# Patient Record
Sex: Male | Born: 1945 | Race: Black or African American | Hispanic: No | Smoking: Current every day smoker
Health system: Southern US, Community
[De-identification: ages and names within clinical notes are randomized; demographics above are authoritative.]

## PROBLEM LIST (undated history)

## (undated) DIAGNOSIS — E119 Type 2 diabetes mellitus without complications: Secondary | ICD-10-CM

## (undated) DIAGNOSIS — I629 Nontraumatic intracranial hemorrhage, unspecified: Secondary | ICD-10-CM

## (undated) DIAGNOSIS — I4891 Unspecified atrial fibrillation: Secondary | ICD-10-CM

## (undated) DIAGNOSIS — K219 Gastro-esophageal reflux disease without esophagitis: Secondary | ICD-10-CM

## (undated) DIAGNOSIS — F79 Unspecified intellectual disabilities: Secondary | ICD-10-CM

## (undated) DIAGNOSIS — N400 Enlarged prostate without lower urinary tract symptoms: Secondary | ICD-10-CM

## (undated) DIAGNOSIS — I10 Essential (primary) hypertension: Secondary | ICD-10-CM

## (undated) DIAGNOSIS — F209 Schizophrenia, unspecified: Secondary | ICD-10-CM

## (undated) DIAGNOSIS — S12500A Unspecified displaced fracture of sixth cervical vertebra, initial encounter for closed fracture: Secondary | ICD-10-CM

## (undated) DIAGNOSIS — I639 Cerebral infarction, unspecified: Secondary | ICD-10-CM

## (undated) DIAGNOSIS — I4819 Other persistent atrial fibrillation: Secondary | ICD-10-CM

## (undated) DIAGNOSIS — R609 Edema, unspecified: Secondary | ICD-10-CM

## (undated) HISTORY — PX: MULTIPLE TOOTH EXTRACTIONS: SHX2053

---

## 1997-05-13 ENCOUNTER — Encounter: Payer: Self-pay | Admitting: Internal Medicine

## 2001-01-07 ENCOUNTER — Emergency Department (HOSPITAL_COMMUNITY): Admission: EM | Admit: 2001-01-07 | Discharge: 2001-01-07 | Payer: Self-pay | Admitting: Emergency Medicine

## 2001-01-07 ENCOUNTER — Encounter: Payer: Self-pay | Admitting: Emergency Medicine

## 2001-09-22 ENCOUNTER — Encounter: Payer: Self-pay | Admitting: Pulmonary Disease

## 2001-09-22 ENCOUNTER — Ambulatory Visit (HOSPITAL_COMMUNITY): Admission: RE | Admit: 2001-09-22 | Discharge: 2001-09-22 | Payer: Self-pay | Admitting: Pulmonary Disease

## 2002-01-13 ENCOUNTER — Encounter: Payer: Self-pay | Admitting: Family Medicine

## 2002-01-13 ENCOUNTER — Emergency Department: Admission: EM | Admit: 2002-01-13 | Discharge: 2002-01-13 | Payer: Self-pay | Admitting: Emergency Medicine

## 2002-10-01 ENCOUNTER — Encounter: Payer: Self-pay | Admitting: Internal Medicine

## 2002-11-01 ENCOUNTER — Encounter (INDEPENDENT_AMBULATORY_CARE_PROVIDER_SITE_OTHER): Payer: Self-pay | Admitting: *Deleted

## 2002-11-01 ENCOUNTER — Encounter: Payer: Self-pay | Admitting: Internal Medicine

## 2002-11-01 ENCOUNTER — Encounter (INDEPENDENT_AMBULATORY_CARE_PROVIDER_SITE_OTHER): Payer: Self-pay | Admitting: Specialist

## 2002-11-01 ENCOUNTER — Ambulatory Visit (HOSPITAL_COMMUNITY): Admission: RE | Admit: 2002-11-01 | Discharge: 2002-11-01 | Payer: Self-pay | Admitting: Internal Medicine

## 2002-12-29 HISTORY — PX: CATARACT EXTRACTION: SUR2

## 2003-01-10 ENCOUNTER — Encounter: Payer: Self-pay | Admitting: Ophthalmology

## 2003-01-14 ENCOUNTER — Ambulatory Visit (HOSPITAL_COMMUNITY): Admission: RE | Admit: 2003-01-14 | Discharge: 2003-01-14 | Payer: Self-pay | Admitting: Ophthalmology

## 2003-04-22 ENCOUNTER — Ambulatory Visit (HOSPITAL_COMMUNITY): Admission: RE | Admit: 2003-04-22 | Discharge: 2003-04-22 | Payer: Self-pay | Admitting: Ophthalmology

## 2004-03-23 ENCOUNTER — Emergency Department (HOSPITAL_COMMUNITY): Admission: EM | Admit: 2004-03-23 | Discharge: 2004-03-23 | Payer: Self-pay | Admitting: Emergency Medicine

## 2004-05-21 ENCOUNTER — Ambulatory Visit: Payer: Self-pay | Admitting: *Deleted

## 2004-05-28 ENCOUNTER — Ambulatory Visit: Payer: Self-pay | Admitting: Family Medicine

## 2004-06-15 ENCOUNTER — Ambulatory Visit: Payer: Self-pay | Admitting: Family Medicine

## 2004-10-05 ENCOUNTER — Emergency Department (HOSPITAL_COMMUNITY): Admission: EM | Admit: 2004-10-05 | Discharge: 2004-10-06 | Payer: Self-pay | Admitting: Emergency Medicine

## 2004-10-09 ENCOUNTER — Ambulatory Visit: Payer: Self-pay | Admitting: Family Medicine

## 2004-12-10 ENCOUNTER — Emergency Department (HOSPITAL_COMMUNITY): Admission: EM | Admit: 2004-12-10 | Discharge: 2004-12-11 | Payer: Self-pay | Admitting: Emergency Medicine

## 2005-02-28 ENCOUNTER — Emergency Department (HOSPITAL_COMMUNITY): Admission: EM | Admit: 2005-02-28 | Discharge: 2005-02-28 | Payer: Self-pay | Admitting: Emergency Medicine

## 2005-09-03 ENCOUNTER — Ambulatory Visit: Payer: Self-pay | Admitting: Family Medicine

## 2006-04-14 ENCOUNTER — Ambulatory Visit: Payer: Self-pay | Admitting: Family Medicine

## 2006-05-09 ENCOUNTER — Emergency Department (HOSPITAL_COMMUNITY): Admission: EM | Admit: 2006-05-09 | Discharge: 2006-05-10 | Payer: Self-pay | Admitting: Emergency Medicine

## 2006-07-23 ENCOUNTER — Emergency Department (HOSPITAL_COMMUNITY): Admission: EM | Admit: 2006-07-23 | Discharge: 2006-07-23 | Payer: Self-pay | Admitting: Emergency Medicine

## 2006-07-27 ENCOUNTER — Ambulatory Visit: Payer: Self-pay | Admitting: Family Medicine

## 2006-10-07 ENCOUNTER — Emergency Department (HOSPITAL_COMMUNITY): Admission: EM | Admit: 2006-10-07 | Discharge: 2006-10-07 | Payer: Self-pay | Admitting: Emergency Medicine

## 2006-11-07 ENCOUNTER — Encounter (INDEPENDENT_AMBULATORY_CARE_PROVIDER_SITE_OTHER): Payer: Self-pay | Admitting: *Deleted

## 2006-11-07 ENCOUNTER — Emergency Department (HOSPITAL_COMMUNITY): Admission: EM | Admit: 2006-11-07 | Discharge: 2006-11-07 | Payer: Self-pay | Admitting: Emergency Medicine

## 2007-03-03 ENCOUNTER — Emergency Department (HOSPITAL_COMMUNITY): Admission: EM | Admit: 2007-03-03 | Discharge: 2007-03-03 | Payer: Self-pay | Admitting: Emergency Medicine

## 2007-03-27 ENCOUNTER — Ambulatory Visit: Payer: Self-pay | Admitting: Family Medicine

## 2007-03-27 LAB — CONVERTED CEMR LAB
ALT: 10 units/L (ref 0–53)
Albumin: 4.3 g/dL (ref 3.5–5.2)
Alkaline Phosphatase: 117 units/L (ref 39–117)
CO2: 25 meq/L (ref 19–32)
Chloride: 108 meq/L (ref 96–112)
Cholesterol: 120 mg/dL (ref 0–200)
Creatinine, Ser: 0.73 mg/dL (ref 0.40–1.50)
Potassium: 3.7 meq/L (ref 3.5–5.3)
Total Bilirubin: 0.4 mg/dL (ref 0.3–1.2)
Total CHOL/HDL Ratio: 2.4
Triglycerides: 73 mg/dL (ref <150)

## 2007-05-01 ENCOUNTER — Emergency Department (HOSPITAL_COMMUNITY): Admission: EM | Admit: 2007-05-01 | Discharge: 2007-05-01 | Payer: Self-pay | Admitting: *Deleted

## 2007-09-07 ENCOUNTER — Ambulatory Visit: Payer: Self-pay | Admitting: Family Medicine

## 2007-09-07 LAB — CONVERTED CEMR LAB
Cholesterol: 112 mg/dL (ref 0–200)
HDL: 44 mg/dL (ref >39)
Microalb, Ur: 1.45 mg/dL (ref 0.00–1.89)
PSA: 0.35 ng/mL (ref 0.10–4.00)

## 2008-01-01 ENCOUNTER — Ambulatory Visit: Payer: Self-pay | Admitting: Family Medicine

## 2008-03-26 ENCOUNTER — Ambulatory Visit: Payer: Self-pay | Admitting: Family Medicine

## 2008-03-26 LAB — CONVERTED CEMR LAB
ALT: 12 units/L (ref 0–53)
AST: 12 units/L (ref 0–37)
Alkaline Phosphatase: 56 units/L (ref 39–117)
Calcium: 9 mg/dL (ref 8.4–10.5)
Glucose, Bld: 81 mg/dL (ref 70–99)

## 2008-04-04 ENCOUNTER — Encounter (INDEPENDENT_AMBULATORY_CARE_PROVIDER_SITE_OTHER): Payer: Self-pay | Admitting: *Deleted

## 2008-04-04 ENCOUNTER — Emergency Department (HOSPITAL_COMMUNITY): Admission: EM | Admit: 2008-04-04 | Discharge: 2008-04-04 | Payer: Self-pay | Admitting: Emergency Medicine

## 2008-06-06 ENCOUNTER — Ambulatory Visit: Payer: Self-pay | Admitting: Family Medicine

## 2008-09-03 ENCOUNTER — Ambulatory Visit: Payer: Self-pay | Admitting: Family Medicine

## 2008-09-22 ENCOUNTER — Emergency Department (HOSPITAL_COMMUNITY): Admission: EM | Admit: 2008-09-22 | Discharge: 2008-09-22 | Payer: Self-pay | Admitting: Emergency Medicine

## 2008-09-25 ENCOUNTER — Ambulatory Visit: Payer: Self-pay | Admitting: Internal Medicine

## 2008-12-11 ENCOUNTER — Ambulatory Visit: Payer: Self-pay | Admitting: Family Medicine

## 2008-12-11 LAB — CONVERTED CEMR LAB
BUN: 7 mg/dL (ref 6–23)
CO2: 25 meq/L (ref 19–32)
Chloride: 106 meq/L (ref 96–112)
Cholesterol: 103 mg/dL (ref 0–200)
Glucose, Bld: 147 mg/dL — ABNORMAL HIGH (ref 70–99)
HDL: 40 mg/dL (ref >39)
LDL Cholesterol: 40 mg/dL (ref 0–99)
Sodium: 142 meq/L (ref 135–145)
Total CHOL/HDL Ratio: 2.6
Triglycerides: 117 mg/dL (ref <150)
VLDL: 23 mg/dL (ref 0–40)

## 2009-01-13 ENCOUNTER — Encounter (INDEPENDENT_AMBULATORY_CARE_PROVIDER_SITE_OTHER): Payer: Self-pay | Admitting: *Deleted

## 2009-01-13 ENCOUNTER — Emergency Department (HOSPITAL_COMMUNITY): Admission: EM | Admit: 2009-01-13 | Discharge: 2009-01-13 | Payer: Self-pay | Admitting: Emergency Medicine

## 2009-01-23 ENCOUNTER — Ambulatory Visit: Payer: Self-pay | Admitting: Family Medicine

## 2009-01-23 ENCOUNTER — Encounter: Payer: Self-pay | Admitting: Internal Medicine

## 2009-02-06 ENCOUNTER — Telehealth: Payer: Self-pay | Admitting: Internal Medicine

## 2009-02-10 ENCOUNTER — Telehealth: Payer: Self-pay | Admitting: Internal Medicine

## 2009-02-11 ENCOUNTER — Telehealth (INDEPENDENT_AMBULATORY_CARE_PROVIDER_SITE_OTHER): Payer: Self-pay | Admitting: *Deleted

## 2009-02-12 ENCOUNTER — Ambulatory Visit: Payer: Self-pay | Admitting: Internal Medicine

## 2009-02-12 DIAGNOSIS — R079 Chest pain, unspecified: Secondary | ICD-10-CM | POA: Insufficient documentation

## 2009-02-12 DIAGNOSIS — K219 Gastro-esophageal reflux disease without esophagitis: Secondary | ICD-10-CM

## 2009-02-12 DIAGNOSIS — K5900 Constipation, unspecified: Secondary | ICD-10-CM | POA: Insufficient documentation

## 2009-02-12 DIAGNOSIS — F79 Unspecified intellectual disabilities: Secondary | ICD-10-CM | POA: Insufficient documentation

## 2009-02-12 DIAGNOSIS — I10 Essential (primary) hypertension: Secondary | ICD-10-CM

## 2009-02-12 DIAGNOSIS — R112 Nausea with vomiting, unspecified: Secondary | ICD-10-CM

## 2009-02-13 ENCOUNTER — Telehealth: Payer: Self-pay | Admitting: Nurse Practitioner

## 2009-02-17 ENCOUNTER — Telehealth: Payer: Self-pay | Admitting: Internal Medicine

## 2009-02-17 ENCOUNTER — Telehealth: Payer: Self-pay | Admitting: Nurse Practitioner

## 2009-03-31 ENCOUNTER — Ambulatory Visit: Payer: Self-pay | Admitting: Internal Medicine

## 2009-03-31 ENCOUNTER — Telehealth: Payer: Self-pay | Admitting: Internal Medicine

## 2009-04-01 ENCOUNTER — Encounter: Payer: Self-pay | Admitting: Internal Medicine

## 2009-04-04 ENCOUNTER — Ambulatory Visit: Payer: Self-pay | Admitting: Family Medicine

## 2009-04-10 ENCOUNTER — Encounter (INDEPENDENT_AMBULATORY_CARE_PROVIDER_SITE_OTHER): Payer: Self-pay | Admitting: *Deleted

## 2009-04-10 ENCOUNTER — Ambulatory Visit (HOSPITAL_COMMUNITY): Admission: RE | Admit: 2009-04-10 | Discharge: 2009-04-10 | Payer: Self-pay | Admitting: Family Medicine

## 2009-04-16 ENCOUNTER — Telehealth: Payer: Self-pay | Admitting: Internal Medicine

## 2009-04-30 ENCOUNTER — Telehealth: Payer: Self-pay | Admitting: Internal Medicine

## 2009-05-01 ENCOUNTER — Telehealth (INDEPENDENT_AMBULATORY_CARE_PROVIDER_SITE_OTHER): Payer: Self-pay | Admitting: *Deleted

## 2009-05-16 ENCOUNTER — Ambulatory Visit: Payer: Self-pay | Admitting: Family Medicine

## 2009-05-19 ENCOUNTER — Ambulatory Visit: Payer: Self-pay | Admitting: Family Medicine

## 2009-06-09 ENCOUNTER — Ambulatory Visit: Payer: Self-pay | Admitting: Family Medicine

## 2009-08-21 ENCOUNTER — Ambulatory Visit: Payer: Self-pay | Admitting: Family Medicine

## 2009-08-21 LAB — CONVERTED CEMR LAB
Chloride: 102 meq/L (ref 96–112)
Vit D, 25-Hydroxy: 47 ng/mL (ref 30–89)

## 2009-12-10 ENCOUNTER — Ambulatory Visit (HOSPITAL_COMMUNITY): Admission: RE | Admit: 2009-12-10 | Discharge: 2009-12-10 | Payer: Self-pay | Admitting: Orthopedic Surgery

## 2010-01-15 ENCOUNTER — Ambulatory Visit (HOSPITAL_COMMUNITY): Admission: AD | Admit: 2010-01-15 | Discharge: 2010-01-15 | Payer: Self-pay | Admitting: Cardiology

## 2010-04-12 ENCOUNTER — Emergency Department (HOSPITAL_COMMUNITY): Admission: EM | Admit: 2010-04-12 | Discharge: 2010-04-13 | Payer: Self-pay | Admitting: Emergency Medicine

## 2010-11-13 LAB — URINE CULTURE

## 2010-11-13 LAB — URINALYSIS, ROUTINE W REFLEX MICROSCOPIC
Bilirubin Urine: NEGATIVE
Glucose, UA: 250 mg/dL — AB
Ketones, ur: NEGATIVE mg/dL
Leukocytes, UA: NEGATIVE
Nitrite: NEGATIVE
Protein, ur: NEGATIVE mg/dL
Specific Gravity, Urine: 1.007 (ref 1.005–1.030)
Urobilinogen, UA: 1 mg/dL (ref 0.0–1.0)

## 2010-11-13 LAB — DIFFERENTIAL
Eosinophils Absolute: 0.1 10*3/uL (ref 0.0–0.7)
Lymphocytes Relative: 33 % (ref 12–46)
Monocytes Relative: 10 % (ref 3–12)
Neutrophils Relative %: 55 % (ref 43–77)

## 2010-11-13 LAB — CBC
HCT: 44.8 % (ref 39.0–52.0)
Hemoglobin: 15 g/dL (ref 13.0–17.0)
MCH: 31.8 pg (ref 26.0–34.0)
MCV: 95.3 fL (ref 78.0–100.0)
Platelets: 198 10*3/uL (ref 150–400)
RBC: 4.7 MIL/uL (ref 4.22–5.81)

## 2010-11-13 LAB — BASIC METABOLIC PANEL
BUN: 8 mg/dL (ref 6–23)
CO2: 28 mEq/L (ref 19–32)
Calcium: 9.3 mg/dL (ref 8.4–10.5)
Chloride: 107 mEq/L (ref 96–112)
Potassium: 4 mEq/L (ref 3.5–5.1)

## 2010-11-13 LAB — POCT CARDIAC MARKERS
CKMB, poc: 1.5 ng/mL (ref 1.0–8.0)
Myoglobin, poc: 64.6 ng/mL (ref 12–200)
Troponin i, poc: <0.05 ng/mL (ref 0.00–0.09)

## 2010-11-18 LAB — CBC
HCT: 45.6 % (ref 39.0–52.0)
Platelets: 193 10*3/uL (ref 150–400)
WBC: 6.7 10*3/uL (ref 4.0–10.5)

## 2010-11-18 LAB — BASIC METABOLIC PANEL
BUN: 5 mg/dL — ABNORMAL LOW (ref 6–23)
Calcium: 9.6 mg/dL (ref 8.4–10.5)
GFR calc non Af Amer: >60 mL/min (ref >60)
Potassium: 4.2 mEq/L (ref 3.5–5.1)
Sodium: 139 mEq/L (ref 135–145)

## 2010-12-08 LAB — POCT I-STAT, CHEM 8
Creatinine, Ser: 0.8 mg/dL (ref 0.4–1.5)
HCT: 48 % (ref 39.0–52.0)
Hemoglobin: 16.3 g/dL (ref 13.0–17.0)
Potassium: 3.5 mEq/L (ref 3.5–5.1)
Sodium: 143 mEq/L (ref 135–145)
TCO2: 30 mmol/L (ref 0–100)

## 2010-12-08 LAB — URINE MICROSCOPIC-ADD ON

## 2010-12-08 LAB — URINALYSIS, ROUTINE W REFLEX MICROSCOPIC
Bilirubin Urine: NEGATIVE
Leukocytes, UA: NEGATIVE
Nitrite: NEGATIVE
Specific Gravity, Urine: 1.007 (ref 1.005–1.030)
Urobilinogen, UA: 1 mg/dL (ref 0.0–1.0)
pH: 7 (ref 5.0–8.0)

## 2010-12-08 LAB — POCT CARDIAC MARKERS
CKMB, poc: <1 ng/mL — ABNORMAL LOW (ref 1.0–8.0)
CKMB, poc: <1 ng/mL — ABNORMAL LOW (ref 1.0–8.0)
Myoglobin, poc: 34.9 ng/mL (ref 12–200)
Myoglobin, poc: 47.1 ng/mL (ref 12–200)

## 2010-12-14 LAB — GLUCOSE, CAPILLARY: Glucose-Capillary: 150 mg/dL — ABNORMAL HIGH (ref 70–99)

## 2011-01-15 NOTE — Op Note (Signed)
NAMEKAMAAL, CAST NO.:  [id number]   MEDICAL RECORD NO.:  [id number]                   PATIENT TYPE:  OIB   LOCATION:  2872                                 FACILITY:  [facility]   PHYSICIAN:  Salley Scarlet., M.D.         DATE OF BIRTH:  1946/06/07   DATE OF PROCEDURE:  01/14/2003  DATE OF DISCHARGE:  01/14/2003                                 OPERATIVE REPORT   PREOPERATIVE DIAGNOSIS:  Immature cataract, left eye.   POSTOPERATIVE DIAGNOSIS:  Immature cataract, left eye.   PROCEDURE:  Kellman phacoemulsification cataract of left eye.   ANESTHESIA:  Local using Xylocaine 2%, Marcaine 0.75%, and Wydase.   INDICATIONS FOR PROCEDURE:  This is a 65 year old diabetic gentleman who  complains of blurring of vision. He was evaluated and found to have a visual  acuity best corrected at 20/50 on the right and 20/60 on the left.  There  were bilateral posterior capsular cataracts worse on the left than the  right. Cataract extraction of the left eye was recommended.  He is admitted  at this time for that purpose.   DESCRIPTION OF PROCEDURE:  Under influence of IV sedation and Van Lint  akinesia, retrobulbar anesthesia was given.  The patient was prepped and  draped in the usual sterile fashion.  The lid speculum was inserted under  the upper and lower lid of the left eye and a 4-0 silk traction suture was  passed through the belly of the superior rectus muscle for traction. A  fornix-based conjunctival flap was turned and hemostasis was achieved with  the use of cautery.  An incision was made in the superior limbus. This  incision was dissected down to clear cornea using crescent blade.  A  sideport incision was made at the 1:30 o'clock position.  Occucoat was  injected into the eye through the sideport incision.  The anterior chamber  was then entered through the corneoscleral tunnel incision at 11:30 o'clock  position using a 2.7 mm Keratome. An  anterior capsulotomy was done using  bent 25 gauge needle. The nucleus was hydrodissected using Xylocaine.  The  KPE handpiece was passed into the eye and the nucleus was emulsified without  difficulty.  The residual cortical material was aspirated.  The posterior  capsule was polished using olive-tip polisher. The wound was widened  slightly to accommodate a foldable silicon lens. This lens was seated into  the eye behind the iris without difficulty. The anterior chamber was  reformed and the pupil was constricted using Miochol.  The residual Occucoat  was aspirated from the eye. The anterior chamber was then reformed using  Miochol. The ellipse of the wound was tested to make sure that there was no  leak.  Ascertaining that there was no leak, the conjunctiva was closed over  the wound using thermocautery. 1 mL of Celestone and 0.5 mL of gentamicin  were injected subconjunctivally.  Maxitrol  ophthalmic ointment and Pilopine  ointment were applied along with a patch and Fox shield. The patient  tolerated the procedure well and was discharged to the postanesthesia care  unit in satisfactory condition. He is instructed to rest today, to take  Vicodin every four hours as needed for pain, and to see me in the office  tomorrow for further evaluation.   DISCHARGE DIAGNOSIS:  Immature cataract, left eye.                                               Salley Scarlet., M.D.    TB/MEDQ  D:  01/14/2003  T:  01/15/2003  Job:  [id number]

## 2011-01-29 HISTORY — PX: CATARACT EXTRACTION W/ INTRAOCULAR LENS IMPLANT: SHX1309

## 2011-02-15 ENCOUNTER — Encounter (HOSPITAL_COMMUNITY)
Admission: RE | Admit: 2011-02-15 | Discharge: 2011-02-15 | Disposition: A | Discharge status: Home / Self Care | Payer: Medicare Other | Source: Ambulatory Visit | Attending: Ophthalmology | Admitting: Ophthalmology

## 2011-02-15 LAB — CBC
MCH: 31.1 pg (ref 26.0–34.0)
MCHC: 35 g/dL (ref 30.0–36.0)
MCV: 89 fL (ref 78.0–100.0)
Platelets: 221 10*3/uL (ref 150–400)
RBC: 4.98 MIL/uL (ref 4.22–5.81)

## 2011-02-15 LAB — BASIC METABOLIC PANEL
CO2: 29 mEq/L (ref 19–32)
Calcium: 9.4 mg/dL (ref 8.4–10.5)
Creatinine, Ser: 0.67 mg/dL (ref 0.50–1.35)
GFR calc non Af Amer: >60 mL/min (ref >60)
Glucose, Bld: 336 mg/dL — ABNORMAL HIGH (ref 70–99)
Sodium: 138 mEq/L (ref 135–145)

## 2011-02-17 ENCOUNTER — Ambulatory Visit (HOSPITAL_COMMUNITY)
Admission: RE | Admit: 2011-02-17 | Discharge: 2011-02-17 | Disposition: A | Discharge status: Home / Self Care | Payer: Medicare Other | Source: Ambulatory Visit | Attending: Ophthalmology | Admitting: Ophthalmology

## 2011-02-17 DIAGNOSIS — Z01812 Encounter for preprocedural laboratory examination: Secondary | ICD-10-CM | POA: Insufficient documentation

## 2011-02-17 DIAGNOSIS — F209 Schizophrenia, unspecified: Secondary | ICD-10-CM | POA: Insufficient documentation

## 2011-02-17 DIAGNOSIS — I10 Essential (primary) hypertension: Secondary | ICD-10-CM | POA: Insufficient documentation

## 2011-02-17 DIAGNOSIS — H269 Unspecified cataract: Secondary | ICD-10-CM | POA: Insufficient documentation

## 2011-02-17 LAB — GLUCOSE, CAPILLARY
Glucose-Capillary: 249 mg/dL — ABNORMAL HIGH (ref 70–99)
Glucose-Capillary: 250 mg/dL — ABNORMAL HIGH (ref 70–99)
Glucose-Capillary: 194 mg/dL — ABNORMAL HIGH (ref 70–99)

## 2011-03-31 NOTE — Op Note (Signed)
  NAMEASBERRY, LASCOLA              ACCOUNT NO.:  [id number]  MEDICAL RECORD NO.:  [id number]  LOCATION:  [facility]                         FACILITY:  [facility]  PHYSICIAN:  Shade Flood, MD       DATE OF BIRTH:  1945/12/02  DATE OF PROCEDURE:  02/17/2011 DATE OF DISCHARGE:                              OPERATIVE REPORT   PREOPERATIVE DIAGNOSIS:  Cataract, right eye.  POSTOPERATIVE DIAGNOSIS:  Cataract, right eye.  PROCEDURE PERFORMED:  Phacoemulsification with posterior chamber intraocular lens, right eye.  SURGEON:  Shade Flood, MD  ESTIMATED BLOOD LOSS:  Less than 1 mL.  COMPLICATIONS:  There were no complications.  SPECIMENS:  No specimens for Pathology.  The patient was prepared and draped in the usual fashion for ocular surgery on the right eye and a solid lid speculum was placed.  Calipers were used to show 3.5 mm at the superior surgical limbus centered at the 11 o'clock meridian.  A Grieshaber 0/1 blade was used to make a peripheral corneal scleral groove and then the keratome was used to make a self-sealing corneal incision centered at that meridian.  Provisc was instilled and a bent 25-gauge needle was used to make a capsulorrhexis. The lens was hydrodissected and the Chang chopper was used to rotate the lens within the capsular bag to ensure adequate mobility.  A separate stab incision was made at the 2:30 meridian.  The phaco handpiece and Chang chopper were inserted and a combined phaco chop technique was employed fracturing the lens with subsequent removal of the fragments with the phaco handpiece.  The IA cannula was then used to remove the cortex from the capsule bag.  Provisc was then instilled into the anterior chamber and the capsule bag and the wound was enlarged to its full 3.5 mm.  The Monarch injector was used to place a folded AcrySof MA50-BM intraocular lens into the bag and the Sinskey lens hook was used to dial in the trailing haptic.  The IA cannula was  then used to remove viscoelastic from the anterior chamber and one interrupted 10-0 nylon suture was used to reapproximate the wound.  The patient was given 100 mg of Ancef and 4 mg of Decadron subconjunctivally and then the lid speculum was removed.  The eye was patched with polymyxin/bacitracin ophthalmic ointment and a plastic shield was placed.  He was transferred alert with stable vital signs from the operating room to the postoperative recovery area.          ______________________________ Shade Flood, MD     GG/MEDQ  D:  02/17/2011  T:  02/18/2011  Job:  [id number]  Electronically Signed by Shade Flood MD on 03/31/2011 11:18:18 AM

## 2011-05-28 LAB — CBC
MCV: 96.7
Platelets: 181
RBC: 5.44
WBC: 8

## 2011-05-28 LAB — DIFFERENTIAL
Basophils Absolute: 0
Basophils Relative: 1
Eosinophils Absolute: 0.1
Eosinophils Relative: 1
Monocytes Absolute: 0.6

## 2011-05-28 LAB — URINALYSIS, ROUTINE W REFLEX MICROSCOPIC
Bilirubin Urine: NEGATIVE
Ketones, ur: NEGATIVE
Protein, ur: NEGATIVE
Specific Gravity, Urine: 1.021
Urobilinogen, UA: 1

## 2011-05-28 LAB — COMPREHENSIVE METABOLIC PANEL
ALT: 20
AST: 18
Albumin: 4.5
Alkaline Phosphatase: 78
CO2: 29
Chloride: 103
GFR calc Af Amer: >60
GFR calc non Af Amer: >60
Potassium: 3.5
Total Bilirubin: 0.7

## 2011-05-28 LAB — LIPASE, BLOOD: Lipase: 28

## 2011-05-28 LAB — URINE MICROSCOPIC-ADD ON

## 2011-06-11 LAB — DIFFERENTIAL
Basophils Absolute: 0
Eosinophils Absolute: 0
Eosinophils Relative: 0
Neutrophils Relative %: 87 — ABNORMAL HIGH

## 2011-06-11 LAB — CBC
HCT: 42.6
MCV: 93.5
Platelets: 208
RDW: 15 — ABNORMAL HIGH

## 2011-06-11 LAB — BASIC METABOLIC PANEL
Calcium: 8.7
Chloride: 105
Creatinine, Ser: 0.84
GFR calc Af Amer: >60
Sodium: 141

## 2012-01-13 ENCOUNTER — Emergency Department (HOSPITAL_COMMUNITY)
Admission: EM | Admit: 2012-01-13 | Discharge: 2012-01-13 | Disposition: A | Discharge status: Skilled Nursing Facility | Payer: Medicare Other | Attending: Emergency Medicine | Admitting: Emergency Medicine

## 2012-01-13 ENCOUNTER — Encounter (HOSPITAL_COMMUNITY): Payer: Self-pay | Admitting: Emergency Medicine

## 2012-01-13 DIAGNOSIS — Z79899 Other long term (current) drug therapy: Secondary | ICD-10-CM | POA: Insufficient documentation

## 2012-01-13 DIAGNOSIS — M7989 Other specified soft tissue disorders: Secondary | ICD-10-CM

## 2012-01-13 DIAGNOSIS — I10 Essential (primary) hypertension: Secondary | ICD-10-CM | POA: Insufficient documentation

## 2012-01-13 DIAGNOSIS — M79609 Pain in unspecified limb: Secondary | ICD-10-CM | POA: Insufficient documentation

## 2012-01-13 DIAGNOSIS — E119 Type 2 diabetes mellitus without complications: Secondary | ICD-10-CM | POA: Insufficient documentation

## 2012-01-13 DIAGNOSIS — Z8659 Personal history of other mental and behavioral disorders: Secondary | ICD-10-CM | POA: Insufficient documentation

## 2012-01-13 DIAGNOSIS — K219 Gastro-esophageal reflux disease without esophagitis: Secondary | ICD-10-CM | POA: Insufficient documentation

## 2012-01-13 DIAGNOSIS — I2510 Atherosclerotic heart disease of native coronary artery without angina pectoris: Secondary | ICD-10-CM | POA: Insufficient documentation

## 2012-01-13 DIAGNOSIS — M79622 Pain in left upper arm: Secondary | ICD-10-CM

## 2012-01-13 HISTORY — DX: Schizophrenia, unspecified: F20.9

## 2012-01-13 HISTORY — DX: Gastro-esophageal reflux disease without esophagitis: K21.9

## 2012-01-13 HISTORY — DX: Edema, unspecified: R60.9

## 2012-01-13 HISTORY — DX: Essential (primary) hypertension: I10

## 2012-01-13 LAB — PROTIME-INR
INR: 1.01 (ref 0.00–1.49)
Prothrombin Time: 13.5 seconds (ref 11.6–15.2)

## 2012-01-13 LAB — BASIC METABOLIC PANEL
CO2: 28 mEq/L (ref 19–32)
Calcium: 9.2 mg/dL (ref 8.4–10.5)
Chloride: 97 mEq/L (ref 96–112)
Potassium: 3.7 mEq/L (ref 3.5–5.1)
Sodium: 136 mEq/L (ref 135–145)

## 2012-01-13 LAB — DIFFERENTIAL
Basophils Absolute: 0 10*3/uL (ref 0.0–0.1)
Lymphocytes Relative: 43 % (ref 12–46)
Neutro Abs: 4.1 10*3/uL (ref 1.7–7.7)
Neutrophils Relative %: 49 % (ref 43–77)

## 2012-01-13 LAB — CBC
Platelets: 210 10*3/uL (ref 150–400)
RBC: 5.12 MIL/uL (ref 4.22–5.81)
RDW: 16.8 % — ABNORMAL HIGH (ref 11.5–15.5)
WBC: 8.4 10*3/uL (ref 4.0–10.5)

## 2012-01-13 LAB — APTT: aPTT: 29 seconds (ref 24–37)

## 2012-01-13 NOTE — ED Notes (Signed)
ION:[id number]<MW> Expected date:<BR> Expected time: 4:41 PM<BR> Means of arrival:<BR> Comments:<BR> M30 - 66yoM Arm edema

## 2012-01-13 NOTE — Progress Notes (Signed)
VASCULAR LAB PRELIMINARY  PRELIMINARY  PRELIMINARY  PRELIMINARY  Left upper extremity venous duplex has been  completed.    Preliminary report: Left upper extremity is negative for deep and superficial vein thrombosis.     Vanna Scotland, 01/13/2012, 10:36 PM

## 2012-01-13 NOTE — ED Notes (Signed)
Notified by vascular tech will be a delay due to multiple test to be done at [facility] before they can come to [facility].

## 2012-01-13 NOTE — ED Provider Notes (Signed)
History     CSN: [id number]  Arrival date & time 01/13/12  1648   First MD Initiated Contact with Patient 01/13/12 1716      Chief Complaint  Patient presents with  . Arm Swelling    (Consider location/radiation/quality/duration/timing/severity/associated sxs/prior treatment) HPI Pt presents with swelling to LUE x 1 day with pain at bicep. No SOB or CP, Hx of blood clots, fever. No known trauma.  Past Medical History  Diagnosis Date  . Diabetes mellitus   . Hypertension   . GERD (gastroesophageal reflux disease)   . Vitamin d deficiency   . Constipation   . Schizophrenia   . Seizure   . Edema   . Glaucoma   . CAD (coronary artery disease)     No past surgical history on file.  No family history on file.  History  Substance Use Topics  . Smoking status: Current Everyday Smoker  . Smokeless tobacco: Not on file  . Alcohol Use: No      Review of Systems  Constitutional: Negative for fever and chills.  Respiratory: Negative for shortness of breath.   Cardiovascular: Negative for chest pain, palpitations and leg swelling.  Gastrointestinal: Negative for nausea, vomiting and abdominal pain.  Skin: Negative for pallor and rash.  Neurological: Negative for numbness.    Allergies  Review of patient's allergies indicates no known allergies.  Home Medications   Current Outpatient Rx  Name Route Sig Dispense Refill  . ACETAMINOPHEN 325 MG PO TABS Oral Take 325 mg by mouth every 6 (six) hours as needed. Take one or two tablets as needed for pain.    Marland Kitchen VITAMIN D 1000 UNITS PO TABS Oral Take 1,000 Units by mouth daily.    Marland Kitchen CLONIDINE HCL 0.1 MG PO TABS Oral Take 0.1 mg by mouth as directed. Take one tablet by mouth every hour as needed for systolic blood pressure greater than 190 or diastolic blood pressure greater than 100. Not to exceed 0.6mg  in 24hrs.    . CYCLOSPORINE 0.05 % OP EMUL Both Eyes Place 1 drop into both eyes 2 (two) times daily.    Marland Kitchen DICLOFENAC SODIUM  75 MG PO TBEC Oral Take 75 mg by mouth 2 (two) times daily.    Marland Kitchen DOCUSATE SODIUM 100 MG PO CAPS Oral Take 100 mg by mouth 2 (two) times daily.    . DORZOLAMIDE HCL-TIMOLOL MAL 22.3-6.8 MG/ML OP SOLN Both Eyes Place 1 drop into both eyes 2 (two) times daily.    . FUROSEMIDE 40 MG PO TABS Oral Take 40 mg by mouth daily.    Marland Kitchen HYDROCHLOROTHIAZIDE 25 MG PO TABS Oral Take 25 mg by mouth daily. Take 1/2 tab by mouth in am    . IBUPROFEN 800 MG PO TABS Oral Take 800 mg by mouth 2 (two) times daily between meals as needed. Take with food     If needed for pain   PRN    . INSULIN ASPART 100 UNIT/ML [place] SOLN Subcutaneous Inject 4 Units into the skin 3 (three) times daily before meals. Hold if blood sugar less than 80.    . INSULIN GLARGINE 100 UNIT/ML [place] SOLN Subcutaneous Inject 12 Units into the skin at bedtime.    . ISOSORBIDE MONONITRATE 20 MG PO TABS Oral Take 20 mg by mouth 2 (two) times daily.    Marland Kitchen LATANOPROST 0.005 % OP SOLN Both Eyes Place 1 drop into both eyes at bedtime.    Marland Kitchen LISINOPRIL 20 MG PO TABS  Oral Take 20 mg by mouth daily.    . MELOXICAM 15 MG PO TABS Oral Take 15 mg by mouth daily.    Marland Kitchen METFORMIN HCL 1000 MG PO TABS Oral Take 1,000 mg by mouth 2 (two) times daily with a meal.    . METOPROLOL TARTRATE 100 MG PO TABS Oral Take 100 mg by mouth 2 (two) times daily. Check pulse before giving  And if less than 60 hold dose.    . NYSTATIN 100000 UNIT/GM EX OINT Topical Apply 1 application topically daily. Between toes once daily for 4 months    . OMEPRAZOLE 20 MG PO CPDR Oral Take 20 mg by mouth daily. Take two capsules by mouth once daily 30 min prior to evening meal.    . PIOGLITAZONE HCL 15 MG PO TABS Oral Take 15 mg by mouth daily.    Marland Kitchen RISPERIDONE 2 MG PO TABS Oral Take 2 mg by mouth at bedtime.    . TRAMADOL HCL 50 MG PO TABS Oral Take 50 mg by mouth 3 (three) times daily as needed. For pain      BP 165/86  Pulse 59  Temp(Src) 98.3 F (36.8 C) (Tympanic)  Resp 16  Ht 5\' 9"  (1.753 m)   SpO2 98%  Physical Exam  Nursing note and vitals reviewed. Constitutional: He is oriented to person, place, and time. He appears well-developed and well-nourished. No distress.  HENT:  Head: Normocephalic and atraumatic.  Mouth/Throat: Oropharynx is clear and moist.  Eyes: EOM are normal. Pupils are equal, round, and reactive to light.  Neck: Normal range of motion. Neck supple.  Cardiovascular: Normal rate and regular rhythm.   Pulmonary/Chest: Effort normal and breath sounds normal. No respiratory distress. He has no wheezes. He has no rales.  Abdominal: Soft. Bowel sounds are normal.  Musculoskeletal: Normal range of motion. He exhibits tenderness (Pt has ttp of L bicep without appreciable swelling, 2+ radial pulses distally). He exhibits no edema.  Neurological: He is alert and oriented to person, place, and time.       5/5 motor in all ext, sensation intact.   Skin: Skin is warm and dry. No rash noted. No erythema.  Psychiatric: He has a normal mood and affect. His behavior is normal.    ED Course  Procedures (including critical care time)  Labs Reviewed  CBC - Abnormal; Notable for the following:    RDW 16.8 (*)    All other components within normal limits  BASIC METABOLIC PANEL - Abnormal; Notable for the following:    Glucose, Bld 224 (*)    All other components within normal limits  DIFFERENTIAL  PROTIME-INR  APTT   No results found.   1. Left upper arm pain       MDM   No DVT. Muscle strain likely. Will d/c home.        Loren Racer, MD 01/13/12 [phone]

## 2012-01-13 NOTE — ED Notes (Signed)
Per EMS pt transported from [facility] c/o L arm edema onset yesterday. Denies pain, denies trauma.

## 2012-01-13 NOTE — ED Notes (Signed)
Called report to nursing home, PTAR paged.

## 2012-01-13 NOTE — ED Notes (Signed)
Vascular tech in with pt

## 2013-02-01 ENCOUNTER — Emergency Department (HOSPITAL_COMMUNITY): Payer: Medicare Other

## 2013-02-01 ENCOUNTER — Encounter (HOSPITAL_COMMUNITY): Payer: Self-pay

## 2013-02-01 ENCOUNTER — Inpatient Hospital Stay (HOSPITAL_COMMUNITY)
Admission: EM | Admit: 2013-02-01 | Discharge: 2013-02-05 | DRG: 639 | Disposition: A | Discharge status: Nursing Home (Includes ICF) | Payer: Medicare Other | Attending: Internal Medicine | Admitting: Internal Medicine

## 2013-02-01 DIAGNOSIS — K5900 Constipation, unspecified: Secondary | ICD-10-CM

## 2013-02-01 DIAGNOSIS — E119 Type 2 diabetes mellitus without complications: Secondary | ICD-10-CM

## 2013-02-01 DIAGNOSIS — I10 Essential (primary) hypertension: Secondary | ICD-10-CM

## 2013-02-01 DIAGNOSIS — Z79899 Other long term (current) drug therapy: Secondary | ICD-10-CM

## 2013-02-01 DIAGNOSIS — I498 Other specified cardiac arrhythmias: Secondary | ICD-10-CM | POA: Diagnosis present

## 2013-02-01 DIAGNOSIS — T50995A Adverse effect of other drugs, medicaments and biological substances, initial encounter: Secondary | ICD-10-CM | POA: Diagnosis present

## 2013-02-01 DIAGNOSIS — F79 Unspecified intellectual disabilities: Secondary | ICD-10-CM

## 2013-02-01 DIAGNOSIS — T383X5A Adverse effect of insulin and oral hypoglycemic [antidiabetic] drugs, initial encounter: Secondary | ICD-10-CM | POA: Diagnosis present

## 2013-02-01 DIAGNOSIS — F17200 Nicotine dependence, unspecified, uncomplicated: Secondary | ICD-10-CM | POA: Diagnosis present

## 2013-02-01 DIAGNOSIS — E860 Dehydration: Secondary | ICD-10-CM

## 2013-02-01 DIAGNOSIS — F209 Schizophrenia, unspecified: Secondary | ICD-10-CM | POA: Diagnosis present

## 2013-02-01 DIAGNOSIS — G40909 Epilepsy, unspecified, not intractable, without status epilepticus: Secondary | ICD-10-CM | POA: Diagnosis present

## 2013-02-01 DIAGNOSIS — H409 Unspecified glaucoma: Secondary | ICD-10-CM | POA: Diagnosis present

## 2013-02-01 DIAGNOSIS — E1169 Type 2 diabetes mellitus with other specified complication: Principal | ICD-10-CM | POA: Diagnosis present

## 2013-02-01 DIAGNOSIS — I2510 Atherosclerotic heart disease of native coronary artery without angina pectoris: Secondary | ICD-10-CM | POA: Diagnosis present

## 2013-02-01 DIAGNOSIS — E162 Hypoglycemia, unspecified: Secondary | ICD-10-CM

## 2013-02-01 DIAGNOSIS — Z794 Long term (current) use of insulin: Secondary | ICD-10-CM

## 2013-02-01 DIAGNOSIS — E8779 Other fluid overload: Secondary | ICD-10-CM | POA: Diagnosis present

## 2013-02-01 DIAGNOSIS — K219 Gastro-esophageal reflux disease without esophagitis: Secondary | ICD-10-CM

## 2013-02-01 DIAGNOSIS — R079 Chest pain, unspecified: Secondary | ICD-10-CM

## 2013-02-01 DIAGNOSIS — N179 Acute kidney failure, unspecified: Secondary | ICD-10-CM

## 2013-02-01 DIAGNOSIS — R112 Nausea with vomiting, unspecified: Secondary | ICD-10-CM

## 2013-02-01 DIAGNOSIS — E559 Vitamin D deficiency, unspecified: Secondary | ICD-10-CM | POA: Diagnosis present

## 2013-02-01 DIAGNOSIS — N2889 Other specified disorders of kidney and ureter: Secondary | ICD-10-CM

## 2013-02-01 LAB — URINALYSIS, ROUTINE W REFLEX MICROSCOPIC
Bilirubin Urine: NEGATIVE
Glucose, UA: 100 mg/dL — AB
Hgb urine dipstick: NEGATIVE
Ketones, ur: NEGATIVE mg/dL
Protein, ur: NEGATIVE mg/dL

## 2013-02-01 LAB — COMPREHENSIVE METABOLIC PANEL
Alkaline Phosphatase: 58 U/L (ref 39–117)
BUN: 37 mg/dL — ABNORMAL HIGH (ref 6–23)
CO2: 29 mEq/L (ref 19–32)
Chloride: 95 mEq/L — ABNORMAL LOW (ref 96–112)
Creatinine, Ser: 2.09 mg/dL — ABNORMAL HIGH (ref 0.50–1.35)
GFR calc non Af Amer: 32 mL/min — ABNORMAL LOW (ref >90)
Potassium: 3.6 mEq/L (ref 3.5–5.1)
Total Bilirubin: 0.2 mg/dL — ABNORMAL LOW (ref 0.3–1.2)

## 2013-02-01 LAB — BLOOD GAS, VENOUS
Acid-Base Excess: 3.7 mmol/L — ABNORMAL HIGH (ref 0.0–2.0)
Bicarbonate: 28.8 mEq/L — ABNORMAL HIGH (ref 20.0–24.0)
FIO2: 0.21 %
O2 Saturation: 39.8 %
Patient temperature: 98.6

## 2013-02-01 LAB — POCT I-STAT, CHEM 8
BUN: 38 mg/dL — ABNORMAL HIGH (ref 6–23)
Creatinine, Ser: 2.1 mg/dL — ABNORMAL HIGH (ref 0.50–1.35)
Glucose, Bld: 73 mg/dL (ref 70–99)
Hemoglobin: 12.6 g/dL — ABNORMAL LOW (ref 13.0–17.0)
Potassium: 3.6 mEq/L (ref 3.5–5.1)

## 2013-02-01 LAB — GLUCOSE, CAPILLARY: Glucose-Capillary: 58 mg/dL — ABNORMAL LOW (ref 70–99)

## 2013-02-01 LAB — CBC WITH DIFFERENTIAL/PLATELET
Eosinophils Absolute: 0 10*3/uL (ref 0.0–0.7)
Eosinophils Relative: 0 % (ref 0–5)
Lymphs Abs: 2 10*3/uL (ref 0.7–4.0)
MCHC: 32.4 g/dL (ref 30.0–36.0)
Monocytes Absolute: 1.2 10*3/uL — ABNORMAL HIGH (ref 0.1–1.0)
Monocytes Relative: 10 % (ref 3–12)
Platelets: 288 10*3/uL (ref 150–400)
RDW: 17.1 % — ABNORMAL HIGH (ref 11.5–15.5)
WBC: 12.4 10*3/uL — ABNORMAL HIGH (ref 4.0–10.5)

## 2013-02-01 LAB — KETONES, QUALITATIVE: Acetone, Bld: NEGATIVE

## 2013-02-01 MED ORDER — SODIUM CHLORIDE 0.9 % IV SOLN: Freq: Once | INTRAVENOUS | Status: DC

## 2013-02-01 MED ORDER — DEXTROSE 50 % IV SOLN
50.0000 mL | Freq: Once | INTRAVENOUS | Status: AC
  Administered 2013-02-01: 50 mL via INTRAVENOUS
  Filled 2013-02-01: qty 50

## 2013-02-01 MED ORDER — SODIUM CHLORIDE 0.9 % IV BOLUS (SEPSIS)
1000.0000 mL | Freq: Once | INTRAVENOUS | Status: AC
  Administered 2013-02-01: 1000 mL via INTRAVENOUS

## 2013-02-01 NOTE — ED Notes (Signed)
ZOX:[id number]<AV> Expected date:<BR> Expected time:<BR> Means of arrival:<BR> Comments:<BR> EMS- 67yo chest pain?

## 2013-02-01 NOTE — ED Provider Notes (Signed)
History     CSN: [id number]  Arrival date & time 02/01/13  2040   First MD Initiated Contact with Patient 02/01/13 2049      Chief Complaint  Patient presents with  . Weakness    (Consider location/radiation/quality/duration/timing/severity/associated sxs/prior treatment) HPI Comments: Patient presents from his group home with  Generalized weakness for the past day. He was sitting outside all day long and did not eat. He was found to be hypoglycemic on EMS arrival in the 60s. Patient thinks that his sugar is high. He is a history of schizophrenia. He denies any pain. Contrary to triage note there was no chest pain description. He denies headache, neck pain, chest pain abdominal pain or back pain. He denies any vomiting. He states he feels lightheaded and nauseated. He states he did not eat much today. Denies any fevers, cough congestion, urinary symptoms. He has a history of diabetes, hypertension, schizophrenia, seizure disorder  The history is provided by the patient and the EMS personnel. The history is limited by the condition of the patient.    Past Medical History  Diagnosis Date  . Diabetes mellitus   . Hypertension   . GERD (gastroesophageal reflux disease)   . Vitamin D deficiency   . Constipation   . Schizophrenia   . Seizure   . Edema   . Glaucoma   . CAD (coronary artery disease)     History reviewed. No pertinent past surgical history.  No family history on file.  History  Substance Use Topics  . Smoking status: Current Every Day Smoker  . Smokeless tobacco: Not on file  . Alcohol Use: No      Review of Systems  Constitutional: Positive for activity change, appetite change and fatigue. Negative for fever.  HENT: Negative for congestion and rhinorrhea.   Respiratory: Negative for cough, chest tightness and shortness of breath.   Cardiovascular: Negative for chest pain.  Gastrointestinal: Negative for nausea, vomiting and abdominal pain.  Genitourinary:  Negative for dysuria.  Musculoskeletal: Negative for back pain.  Neurological: Positive for weakness and light-headedness. Negative for dizziness.  A complete 10 system review of systems was obtained and all systems are negative except as noted in the HPI and PMH.    Allergies  Review of patient's allergies indicates no known allergies.  Home Medications   Current Outpatient Rx  Name  Route  Sig  Dispense  Refill  . cholecalciferol (VITAMIN D) 1000 UNITS tablet   Oral   Take 1,000 Units by mouth daily.         . cycloSPORINE (RESTASIS) 0.05 % ophthalmic emulsion   Both Eyes   Place 1 drop into both eyes 2 (two) times daily.         . Diclofenac Sodium CR (VOLTAREN-XR) 100 MG 24 hr tablet   Oral   Take 100 mg by mouth 2 (two) times daily.         Marland Kitchen docusate sodium (COLACE) 100 MG capsule   Oral   Take 100 mg by mouth 2 (two) times daily.         . dorzolamide-timolol (COSOPT) 22.3-6.8 MG/ML ophthalmic solution   Both Eyes   Place 1 drop into both eyes 2 (two) times daily.         . ferrous sulfate 325 (65 FE) MG tablet   Oral   Take 325 mg by mouth daily with breakfast.         . furosemide (LASIX) 40 MG tablet  Oral   Take 40 mg by mouth daily.         . hydrochlorothiazide (HYDRODIURIL) 25 MG tablet   Oral   Take 25 mg by mouth daily.         . insulin aspart (NOVOLOG) 100 UNIT/ML injection   Subcutaneous   Inject 14-16 Units into the skin 3 (three) times daily with meals. 14 units with breakfast 16 units with lunch & supper         . insulin glargine (LANTUS) 100 UNIT/ML injection   Subcutaneous   Inject 30 Units into the skin at bedtime.          . isosorbide mononitrate (ISMO,MONOKET) 20 MG tablet   Oral   Take 20 mg by mouth 2 (two) times daily.         Marland Kitchen latanoprost (XALATAN) 0.005 % ophthalmic solution   Both Eyes   Place 1 drop into both eyes at bedtime.         Marland Kitchen lisinopril (PRINIVIL,ZESTRIL) 20 MG tablet   Oral   Take  20 mg by mouth daily.         . metFORMIN (GLUCOPHAGE) 1000 MG tablet   Oral   Take 1,000 mg by mouth 2 (two) times daily with a meal.         . metoprolol (LOPRESSOR) 100 MG tablet   Oral   Take 100-200 mg by mouth 2 (two) times daily. Take 2 tablets in the morning(0700) Take 1 tablet in the evening(1900) Check pulse before giving  And if less than 60 hold dose.         Marland Kitchen omeprazole (PRILOSEC) 20 MG capsule   Oral   Take 20 mg by mouth daily. Take two capsules by mouth once daily 30 min prior to evening meal.         . pioglitazone (ACTOS) 15 MG tablet   Oral   Take 15 mg by mouth daily.         . risperiDONE (RISPERDAL) 2 MG tablet   Oral   Take 2 mg by mouth at bedtime.         . senna (SENOKOT) 8.6 MG TABS   Oral   Take 2 tablets by mouth at bedtime.         . traMADol (ULTRAM) 50 MG tablet   Oral   Take 50 mg by mouth every 8 (eight) hours as needed for pain.         Marland Kitchen acetaminophen (TYLENOL) 325 MG tablet   Oral   Take 325 mg by mouth every 6 (six) hours as needed. Take one or two tablets as needed for pain.         . cloNIDine (CATAPRES) 0.1 MG tablet   Oral   Take 0.1 mg by mouth as directed. Take one tablet by mouth every hour as needed for systolic blood pressure greater than 190 or diastolic blood pressure greater than 100. Not to exceed 0.6mg  in 24hrs.           BP 142/94  Pulse 85  Temp(Src) 98.8 F (37.1 C) (Oral)  Resp 16  SpO2 100%  Physical Exam  Constitutional: He is oriented to person, place, and time. He appears well-developed and well-nourished. No distress.  HENT:  Head: Normocephalic and atraumatic.  Mouth/Throat: Oropharynx is clear and moist. No oropharyngeal exudate.  Dry mucus membranes  Eyes: Conjunctivae and EOM are normal. Pupils are equal, round, and reactive to light.  Neck: Normal  range of motion. Neck supple.  Cardiovascular: Normal rate, regular rhythm and normal heart sounds.   No murmur  heard. Pulmonary/Chest: Effort normal and breath sounds normal. No respiratory distress.  Abdominal: Soft. There is no tenderness. There is no rebound and no guarding.  Musculoskeletal: Normal range of motion. He exhibits no edema and no tenderness.  Neurological: He is alert and oriented to person, place, and time. No cranial nerve deficit. He exhibits normal muscle tone. Coordination normal.  CN 2-12 intact, no ataxia on finger to nose, no nystagmus, 5/5 strength throughout, no pronator drift, Romberg negative, normal gait.   Skin: Skin is warm.    ED Course  Procedures (including critical care time)  Labs Reviewed  GLUCOSE, CAPILLARY - Abnormal; Notable for the following:    Glucose-Capillary 58 (*)    All other components within normal limits  CBC WITH DIFFERENTIAL - Abnormal; Notable for the following:    WBC 12.4 (*)    RBC 3.93 (*)    Hemoglobin 10.6 (*)    HCT 32.7 (*)    RDW 17.1 (*)    Neutro Abs 9.1 (*)    Monocytes Absolute 1.2 (*)    All other components within normal limits  URINALYSIS, ROUTINE W REFLEX MICROSCOPIC - Abnormal; Notable for the following:    APPearance CLOUDY (*)    Glucose, UA 100 (*)    All other components within normal limits  COMPREHENSIVE METABOLIC PANEL - Abnormal; Notable for the following:    Chloride 95 (*)    BUN 37 (*)    Creatinine, Ser 2.09 (*)    Total Bilirubin 0.2 (*)    GFR calc non Af Amer 32 (*)    GFR calc Af Amer 37 (*)    All other components within normal limits  BLOOD GAS, VENOUS - Abnormal; Notable for the following:    pH, Ven 7.395 (*)    pO2, Ven 25.3 (*)    Bicarbonate 28.8 (*)    Acid-Base Excess 3.7 (*)    All other components within normal limits  PRO B NATRIURETIC PEPTIDE - Abnormal; Notable for the following:    Pro B Natriuretic peptide (BNP) 192.6 (*)    All other components within normal limits  GLUCOSE, CAPILLARY - Abnormal; Notable for the following:    Glucose-Capillary 155 (*)    All other  components within normal limits  POCT I-STAT, CHEM 8 - Abnormal; Notable for the following:    BUN 38 (*)    Creatinine, Ser 2.10 (*)    Calcium, Ion 1.09 (*)    Hemoglobin 12.6 (*)    HCT 37.0 (*)    All other components within normal limits  TROPONIN I  KETONES, QUALITATIVE  GLUCOSE, CAPILLARY  CK   Dg Chest 2 View  02/01/2013   *RADIOLOGY REPORT*  Clinical Data: Weakness.  CHEST - 2 VIEW  Comparison: Chest radiograph performed 04/12/2010  Findings: The lungs are well-aerated.  Mild vascular congestion is noted.  There is no evidence of focal opacification, pleural effusion or pneumothorax.  The heart is borderline normal in size; the mediastinal contour is within normal limits.  No acute osseous abnormalities are seen. Chronic healed right-sided rib fractures are noted.  IMPRESSION: Vascular congestion, without significant pulmonary edema.  Lungs remain grossly clear.   Original Report Authenticated By: Tonia Ghent, M.D.   Ct Head Wo Contrast  02/01/2013   *RADIOLOGY REPORT*  Clinical Data: Weakness  CT HEAD WITHOUT CONTRAST  Technique:  Contiguous axial  images were obtained from the base of the skull through the vertex without contrast.  Comparison: 02/28/2005  Findings: Mild motion artifact is noted. No acute hemorrhage, acute infarction, or mass lesion is seen.  No skull fracture.  No soft tissue abnormality.  Orbits and paranasal sinuses are grossly intact.  IMPRESSION: No acute intracranial finding.   Original Report Authenticated By: Christiana Pellant, M.D.     No diagnosis found.    MDM  Patient found sitting outside c/o weakness, found to be hypoglycemic.  Denies pain at this time.  Has not eaten today.  No fever, cough, congestion.  Workup remarkable for acute renal failure. Cr 2.1 from baseline of 0.6 Patient given IV hydration.  EKG unchanged.  Troponin negative. CT head negative. Chest xray clear. CK normal.  Suspect hypoglycemia 2/2 receiving insulin without eating.   Sugar has stablized in 150-200.  Will need continued IVF for correction of renal failure.     Date: 02/01/2013  Rate: 81  Rhythm: normal sinus rhythm  QRS Axis: normal  Intervals: normal  ST/T Wave abnormalities: normal  Conduction Disutrbances:none  Narrative Interpretation:   Old EKG Reviewed: unchanged    Glynn Octave, MD 02/02/13 0150

## 2013-02-01 NOTE — Progress Notes (Signed)
RT placed patient on 100% NRB due to a panic venous gas of 25.59mmHg. Will continue to monitor.

## 2013-02-01 NOTE — ED Notes (Signed)
Patient transported to X-ray and CT 

## 2013-02-01 NOTE — Progress Notes (Signed)
Went to check on patient .MD took patient off NRB. Venous gas is metabolic.

## 2013-02-01 NOTE — ED Notes (Signed)
Pt presents with EMS with c/o general weakness. Pt lives at an assisted living facility. Per EMS, pt was sitting outside all day with his roommate, did not eat all day, pt is a diabetic. When pt returned inside, per EMS pt told staff that his chest was . hurting but that it always hurts when he is hungry. Pt told EMS he was not in any pain. CBG 61 on EMS arrival, EMS gave one tube of instaglucose and then CBG was 67. 12 lead unremarkable per EMS.

## 2013-02-01 NOTE — ED Notes (Signed)
Rancour made aware of critical venous blood gas value.

## 2013-02-02 ENCOUNTER — Encounter (HOSPITAL_COMMUNITY): Payer: Self-pay | Admitting: Internal Medicine

## 2013-02-02 ENCOUNTER — Inpatient Hospital Stay (HOSPITAL_COMMUNITY): Payer: Medicare Other

## 2013-02-02 DIAGNOSIS — E119 Type 2 diabetes mellitus without complications: Secondary | ICD-10-CM | POA: Diagnosis present

## 2013-02-02 DIAGNOSIS — E162 Hypoglycemia, unspecified: Secondary | ICD-10-CM

## 2013-02-02 DIAGNOSIS — N179 Acute kidney failure, unspecified: Secondary | ICD-10-CM

## 2013-02-02 DIAGNOSIS — E860 Dehydration: Secondary | ICD-10-CM | POA: Diagnosis present

## 2013-02-02 LAB — GLUCOSE, CAPILLARY
Glucose-Capillary: 110 mg/dL — ABNORMAL HIGH (ref 70–99)
Glucose-Capillary: 112 mg/dL — ABNORMAL HIGH (ref 70–99)
Glucose-Capillary: 62 mg/dL — ABNORMAL LOW (ref 70–99)
Glucose-Capillary: 67 mg/dL — ABNORMAL LOW (ref 70–99)
Glucose-Capillary: 73 mg/dL (ref 70–99)

## 2013-02-02 LAB — COMPREHENSIVE METABOLIC PANEL
AST: 9 U/L (ref 0–37)
Alkaline Phosphatase: 52 U/L (ref 39–117)
BUN: 36 mg/dL — ABNORMAL HIGH (ref 6–23)
CO2: 29 mEq/L (ref 19–32)
Chloride: 95 mEq/L — ABNORMAL LOW (ref 96–112)
Creatinine, Ser: 2.02 mg/dL — ABNORMAL HIGH (ref 0.50–1.35)
GFR calc non Af Amer: 33 mL/min — ABNORMAL LOW (ref >90)
Potassium: 3.2 mEq/L — ABNORMAL LOW (ref 3.5–5.1)
Total Bilirubin: 0.2 mg/dL — ABNORMAL LOW (ref 0.3–1.2)

## 2013-02-02 LAB — RENAL FUNCTION PANEL
Albumin: 3.1 g/dL — ABNORMAL LOW (ref 3.5–5.2)
BUN: 34 mg/dL — ABNORMAL HIGH (ref 6–23)
Calcium: 8.8 mg/dL (ref 8.4–10.5)
Glucose, Bld: 199 mg/dL — ABNORMAL HIGH (ref 70–99)
Phosphorus: 4 mg/dL (ref 2.3–4.6)
Potassium: 3.3 mEq/L — ABNORMAL LOW (ref 3.5–5.1)

## 2013-02-02 LAB — HEMOGLOBIN A1C: Hgb A1c MFr Bld: 6.7 % — ABNORMAL HIGH (ref <5.7)

## 2013-02-02 LAB — CBC
MCH: 27.1 pg (ref 26.0–34.0)
MCHC: 32.6 g/dL (ref 30.0–36.0)
MCV: 83.2 fL (ref 78.0–100.0)
Platelets: 284 10*3/uL (ref 150–400)
RBC: 3.76 MIL/uL — ABNORMAL LOW (ref 4.22–5.81)
RDW: 17.2 % — ABNORMAL HIGH (ref 11.5–15.5)

## 2013-02-02 LAB — CK: Total CK: 88 U/L (ref 7–232)

## 2013-02-02 LAB — RETICULOCYTES
RBC.: 3.9 MIL/uL — ABNORMAL LOW (ref 4.22–5.81)
Retic Ct Pct: 1.4 % (ref 0.4–3.1)

## 2013-02-02 LAB — SODIUM, URINE, RANDOM: Sodium, Ur: 39 mEq/L

## 2013-02-02 LAB — CREATININE, URINE, RANDOM: Creatinine, Urine: 168.2 mg/dL

## 2013-02-02 LAB — IRON AND TIBC
Saturation Ratios: 12 % — ABNORMAL LOW (ref 20–55)
TIBC: 315 ug/dL (ref 215–435)
UIBC: 277 ug/dL (ref 125–400)

## 2013-02-02 LAB — FERRITIN: Ferritin: 30 ng/mL (ref 22–322)

## 2013-02-02 LAB — TROPONIN I
Troponin I: <0.3 ng/mL (ref <0.30)
Troponin I: <0.3 ng/mL (ref <0.30)

## 2013-02-02 LAB — TSH: TSH: 0.569 u[IU]/mL (ref 0.350–4.500)

## 2013-02-02 MED ORDER — DORZOLAMIDE HCL-TIMOLOL MAL 2-0.5 % OP SOLN
1.0000 [drp] | Freq: Two times a day (BID) | OPHTHALMIC | Status: DC
  Administered 2013-02-02 – 2013-02-05 (×8): 1 [drp] via OPHTHALMIC
  Filled 2013-02-02: qty 10

## 2013-02-02 MED ORDER — ONDANSETRON HCL 4 MG PO TABS: 4.0000 mg | ORAL_TABLET | Freq: Four times a day (QID) | ORAL | Status: DC | PRN

## 2013-02-02 MED ORDER — METOPROLOL TARTRATE 100 MG PO TABS
100.0000 mg | ORAL_TABLET | Freq: Every day | ORAL | Status: DC
  Administered 2013-02-02: 100 mg via ORAL
  Filled 2013-02-02 (×2): qty 1

## 2013-02-02 MED ORDER — ONDANSETRON HCL 4 MG/2ML IJ SOLN: 4.0000 mg | Freq: Three times a day (TID) | INTRAMUSCULAR | Status: AC | PRN

## 2013-02-02 MED ORDER — TRAMADOL HCL 50 MG PO TABS: 50.0000 mg | ORAL_TABLET | Freq: Three times a day (TID) | ORAL | Status: DC | PRN

## 2013-02-02 MED ORDER — DEXTROSE 50 % IV SOLN: 25.0000 mL | Freq: Once | INTRAVENOUS | Status: DC | PRN

## 2013-02-02 MED ORDER — SODIUM CHLORIDE 0.9 % IJ SOLN
3.0000 mL | Freq: Two times a day (BID) | INTRAMUSCULAR | Status: DC
  Administered 2013-02-03 – 2013-02-05 (×4): 3 mL via INTRAVENOUS

## 2013-02-02 MED ORDER — FUROSEMIDE 10 MG/ML IJ SOLN
40.0000 mg | Freq: Two times a day (BID) | INTRAMUSCULAR | Status: DC
  Administered 2013-02-02 – 2013-02-03 (×2): 40 mg via INTRAVENOUS
  Filled 2013-02-02 (×5): qty 4

## 2013-02-02 MED ORDER — INSULIN ASPART 100 UNIT/ML ~~LOC~~ SOLN
0.0000 [IU] | SUBCUTANEOUS | Status: DC
  Administered 2013-02-02: 3 [IU] via SUBCUTANEOUS
  Administered 2013-02-02: 2 [IU] via SUBCUTANEOUS
  Administered 2013-02-02: 3 [IU] via SUBCUTANEOUS
  Administered 2013-02-02: 2 [IU] via SUBCUTANEOUS
  Administered 2013-02-03: 1 [IU] via SUBCUTANEOUS
  Administered 2013-02-03: 7 [IU] via SUBCUTANEOUS
  Administered 2013-02-03: 2 [IU] via SUBCUTANEOUS
  Administered 2013-02-03: 1 [IU] via SUBCUTANEOUS

## 2013-02-02 MED ORDER — METOPROLOL TARTRATE 100 MG PO TABS
200.0000 mg | ORAL_TABLET | Freq: Every day | ORAL | Status: DC
  Administered 2013-02-02: 200 mg via ORAL
  Filled 2013-02-02 (×2): qty 2

## 2013-02-02 MED ORDER — ACETAMINOPHEN 325 MG PO TABS: 650.0000 mg | ORAL_TABLET | Freq: Four times a day (QID) | ORAL | Status: DC | PRN

## 2013-02-02 MED ORDER — SODIUM CHLORIDE 0.9 % IV SOLN
INTRAVENOUS | Status: DC
  Administered 2013-02-02: 01:00:00 via INTRAVENOUS

## 2013-02-02 MED ORDER — SENNA 8.6 MG PO TABS
2.0000 | ORAL_TABLET | Freq: Every day | ORAL | Status: DC
  Administered 2013-02-02 – 2013-02-04 (×4): 17.2 mg via ORAL
  Filled 2013-02-02 (×4): qty 2

## 2013-02-02 MED ORDER — DOCUSATE SODIUM 100 MG PO CAPS
100.0000 mg | ORAL_CAPSULE | Freq: Two times a day (BID) | ORAL | Status: DC
  Filled 2013-02-02: qty 1

## 2013-02-02 MED ORDER — ONDANSETRON HCL 4 MG/2ML IJ SOLN: 4.0000 mg | Freq: Four times a day (QID) | INTRAMUSCULAR | Status: DC | PRN

## 2013-02-02 MED ORDER — ENOXAPARIN SODIUM 40 MG/0.4ML ~~LOC~~ SOLN
40.0000 mg | SUBCUTANEOUS | Status: DC
  Administered 2013-02-02 – 2013-02-04 (×3): 40 mg via SUBCUTANEOUS
  Filled 2013-02-02 (×4): qty 0.4

## 2013-02-02 MED ORDER — HYDROCODONE-ACETAMINOPHEN 5-325 MG PO TABS: 1.0000 | ORAL_TABLET | ORAL | Status: DC | PRN

## 2013-02-02 MED ORDER — FERROUS SULFATE 325 (65 FE) MG PO TABS
325.0000 mg | ORAL_TABLET | Freq: Every day | ORAL | Status: DC
  Administered 2013-02-02 – 2013-02-05 (×4): 325 mg via ORAL
  Filled 2013-02-02 (×5): qty 1

## 2013-02-02 MED ORDER — POTASSIUM CHLORIDE CRYS ER 20 MEQ PO TBCR
20.0000 meq | EXTENDED_RELEASE_TABLET | Freq: Two times a day (BID) | ORAL | Status: DC
  Administered 2013-02-02 – 2013-02-05 (×6): 20 meq via ORAL
  Filled 2013-02-02 (×7): qty 1

## 2013-02-02 MED ORDER — ACETAMINOPHEN 650 MG RE SUPP: 650.0000 mg | Freq: Four times a day (QID) | RECTAL | Status: DC | PRN

## 2013-02-02 MED ORDER — DEXTROSE 50 % IV SOLN: 50.0000 mL | Freq: Once | INTRAVENOUS | Status: DC | PRN

## 2013-02-02 MED ORDER — SODIUM CHLORIDE 0.9 % IV SOLN: Freq: Once | INTRAVENOUS | Status: DC

## 2013-02-02 MED ORDER — CYCLOSPORINE 0.05 % OP EMUL
1.0000 [drp] | Freq: Two times a day (BID) | OPHTHALMIC | Status: DC
  Administered 2013-02-02 – 2013-02-05 (×8): 1 [drp] via OPHTHALMIC
  Filled 2013-02-02 (×9): qty 1

## 2013-02-02 MED ORDER — DEXTROSE-NACL 5-0.45 % IV SOLN
INTRAVENOUS | Status: DC
  Administered 2013-02-02 (×2): via INTRAVENOUS

## 2013-02-02 MED ORDER — GLUCOSE 40 % PO GEL: 1.0000 | ORAL | Status: DC | PRN

## 2013-02-02 MED ORDER — BIOTENE DRY MOUTH MT LIQD
15.0000 mL | Freq: Two times a day (BID) | OROMUCOSAL | Status: DC
  Administered 2013-02-02 – 2013-02-05 (×6): 15 mL via OROMUCOSAL

## 2013-02-02 MED ORDER — ISOSORBIDE MONONITRATE 20 MG PO TABS
20.0000 mg | ORAL_TABLET | Freq: Two times a day (BID) | ORAL | Status: DC
  Administered 2013-02-02 – 2013-02-03 (×3): 20 mg via ORAL
  Filled 2013-02-02 (×5): qty 1

## 2013-02-02 MED ORDER — PANTOPRAZOLE SODIUM 40 MG PO TBEC
40.0000 mg | DELAYED_RELEASE_TABLET | Freq: Every day | ORAL | Status: DC
  Administered 2013-02-02 – 2013-02-05 (×4): 40 mg via ORAL
  Filled 2013-02-02 (×4): qty 1

## 2013-02-02 MED ORDER — RISPERIDONE 2 MG PO TABS
2.0000 mg | ORAL_TABLET | Freq: Every day | ORAL | Status: DC
  Administered 2013-02-02 – 2013-02-04 (×4): 2 mg via ORAL
  Filled 2013-02-02 (×5): qty 1

## 2013-02-02 MED ORDER — GLUCOSE-VITAMIN C 4-6 GM-MG PO CHEW: 4.0000 | CHEWABLE_TABLET | ORAL | Status: DC | PRN

## 2013-02-02 MED ORDER — LATANOPROST 0.005 % OP SOLN
1.0000 [drp] | Freq: Every day | OPHTHALMIC | Status: DC
  Administered 2013-02-02 – 2013-02-04 (×4): 1 [drp] via OPHTHALMIC
  Filled 2013-02-02: qty 2.5

## 2013-02-02 MED ORDER — DOCUSATE SODIUM 100 MG PO CAPS
100.0000 mg | ORAL_CAPSULE | Freq: Two times a day (BID) | ORAL | Status: DC
  Administered 2013-02-02 – 2013-02-05 (×8): 100 mg via ORAL
  Filled 2013-02-02 (×9): qty 1

## 2013-02-02 NOTE — Progress Notes (Signed)
Hypoglycemic Event  CBG: 62  Treatment: 15 GM carbohydrate snack  Symptoms: None  Follow-up CBG: Time:0236 CBG Result: 74  Possible Reasons for Event: Inadequate meal intake  Comments/MD notified:Patient did not eat anything on 6/5 at the assisted living facility which started the initial hypoglycemic event before being admitted.     Jordan Rowe  Remember to initiate Hypoglycemia Order Set & complete

## 2013-02-02 NOTE — Progress Notes (Signed)
[facility] PROGRESS NOTE  Jordan Rowe WUJ:[id number] DOB: 1945-10-16 DOA: 02/01/2013 PCP: Provider Not In System  Assessment/Plan: Active Problems:   HYPERTENSION   Diabetes mellitus   Dehydration   Acute renal failure   Hypoglycemia    1. DM/Hypoglycemia:  Patient has known insulin-requiring type 2 DM, on Lantus/Metformin, pre-admission, and had reportedly, had poor oral intake on day of admission, in the setting of continued administration of diabetic medication, and ARF. Initial CBG was 60. Managing with iv dextrose infusion. Insulin/metformin are on hold, and patient ate a good breakfast. CBGs appear reasonable at 112-151 today. Follow CBGs.  2. Dehydration/ARF: Patient's creatinine was 2.02 at presentation, BUN 36, consistent with ARF, against a known baseline creatinine of 0.66 on 01/13/12. In the setting of NSAID therapy, ACE-i, and diuretics, I suspect that there may be an element of chronicity, especially as patient is clinically, extra-vascularly volume-overloaded: will reduce ivi fluid rate to 50 mls/hr, check renal U/S, administer iv Lasix and consult nephrology. Nephrotoxins have been placed on hold. Following renal indices.  3. HTN: Reasonable controlled at this time.  4. GERD: Asymptomatic.    Code Status: Full Code.  Family Communication:  Disposition Plan: to be determined.    Brief narrative: 67 y.o. male with history of Diabetes mellitus, Hypertension, GERD, Vitamin D deficiency, Constipation, Schizophrenia, Seizure disorder, Edema, Glaucoma, CAD, mental illness and mental retardation, found outside of his group home confused and stating he wasn't feeling well. He apparently had not had much to eat or drink all day, but was still given his diabetes medication. His blood sugar was found to be 60 and improved only slightly after some food. He was transferred to [facility] emergency department, where he continued to get treatment and blood sugar is stabilized. He was  found to have acute renal failure with a creatinine of 2.1. At this point hospitalist was called to admit.   Consultants:  N/A.   Procedures:  CXR.  Head CT scan.  Antibiotics:  N/A.   HPI/Subjective: Eating well this AM. No new issues.   Objective: Vital signs in last 24 hours: Temp:  [98.1 F (36.7 C)-98.8 F (37.1 C)] 98.1 F (36.7 C) (06/06 0538) Pulse Rate:  [47-85] 77 (06/06 0607) Resp:  [16-18] 16 (06/06 0538) BP: (95-142)/(61-94) 139/85 mmHg (06/06 0607) SpO2:  [98 %-100 %] 100 % (06/06 0538) Weight:  [97.1 kg (214 lb 1.1 oz)] 97.1 kg (214 lb 1.1 oz) (06/06 0056) Weight change:  Last BM Date: 01/31/13 (per pt)  Intake/Output from previous day: 06/05 0701 - 06/06 0700 In: 421.3 [I.V.:421.3] Out: 150 [Urine:150]     Physical Exam: General: Comfortable, alert, not short of breath at rest. Patient appears mildly anasarcic.  HEENT:  Mild clinical pallor, no jaundice, no conjunctival injection or discharge. NECK:  Supple, JVP not seen, no carotid bruits, no palpable lymphadenopathy, no palpable goiter. CHEST:  Clinically clear to auscultation, no wheezes, no crackles. HEART:  Sounds 1 and 2 heard, normal, regular, no murmurs. ABDOMEN:  Distended, soft, non-tender, no palpable organomegaly, no palpable masses, normal bowel sounds. GENITALIA:  Not examined. LOWER EXTREMITIES:  Moderate pitting edema, palpable peripheral pulses. MUSCULOSKELETAL SYSTEM:  Generalized osteoarthritic changes, otherwise, normal. CENTRAL NERVOUS SYSTEM:  No focal neurologic deficit on gross examination.  Lab Results:  Recent Labs  02/01/13 2135 02/01/13 2145 02/02/13 0355  WBC 12.4*  --  9.6  HGB 10.6* 12.6* 10.2*  HCT 32.7* 37.0* 31.3*  PLT 288  --  284  Recent Labs  02/01/13 2135 02/01/13 2145 02/02/13 0355  NA 138 139 135  K 3.6 3.6 3.2*  CL 95* 99 95*  CO2 29  --  29  GLUCOSE 74 73 167*  BUN 37* 38* 36*  CREATININE 2.09* 2.10* 2.02*  CALCIUM 9.5  --  8.9    Recent Results (from the past 240 hour(s))  MRSA PCR SCREENING     Status: None   Collection Time    02/02/13  1:27 AM      Result Value Range Status   MRSA by PCR NEGATIVE  NEGATIVE Final   Comment:            The GeneXpert MRSA Assay (FDA     approved for NASAL specimens     only), is one component of a     comprehensive MRSA colonization     surveillance program. It is not     intended to diagnose MRSA     infection nor to guide or     monitor treatment for     MRSA infections.     Studies/Results: Dg Chest 2 View  02/01/2013   *RADIOLOGY REPORT*  Clinical Data: Weakness.  CHEST - 2 VIEW  Comparison: Chest radiograph performed 04/12/2010  Findings: The lungs are well-aerated.  Mild vascular congestion is noted.  There is no evidence of focal opacification, pleural effusion or pneumothorax.  The heart is borderline normal in size; the mediastinal contour is within normal limits.  No acute osseous abnormalities are seen. Chronic healed right-sided rib fractures are noted.  IMPRESSION: Vascular congestion, without significant pulmonary edema.  Lungs remain grossly clear.   Original Report Authenticated By: Tonia Ghent, M.D.   Ct Head Wo Contrast  02/01/2013   *RADIOLOGY REPORT*  Clinical Data: Weakness  CT HEAD WITHOUT CONTRAST  Technique:  Contiguous axial images were obtained from the base of the skull through the vertex without contrast.  Comparison: 02/28/2005  Findings: Mild motion artifact is noted. No acute hemorrhage, acute infarction, or mass lesion is seen.  No skull fracture.  No soft tissue abnormality.  Orbits and paranasal sinuses are grossly intact.  IMPRESSION: No acute intracranial finding.   Original Report Authenticated By: Christiana Pellant, M.D.    Medications: Scheduled Meds: . antiseptic oral rinse  15 mL Mouth Rinse BID  . cycloSPORINE  1 drop Both Eyes BID  . docusate sodium  100 mg Oral BID  . dorzolamide-timolol  1 drop Both Eyes BID  . enoxaparin (LOVENOX)  injection  40 mg Subcutaneous Q24H  . ferrous sulfate  325 mg Oral Q breakfast  . insulin aspart  0-9 Units Subcutaneous Q4H  . isosorbide mononitrate  20 mg Oral BID  . latanoprost  1 drop Both Eyes QHS  . metoprolol tartrate  100 mg Oral QHS  . metoprolol  200 mg Oral Daily  . pantoprazole  40 mg Oral Daily  . risperiDONE  2 mg Oral QHS  . senna  2 tablet Oral QHS  . sodium chloride  3 mL Intravenous Q12H   Continuous Infusions: . dextrose 5 % and 0.45% NaCl 75 mL/hr at 02/02/13 0140   PRN Meds:.acetaminophen, acetaminophen, dextrose, glucose-Vitamin C, HYDROcodone-acetaminophen, ondansetron (ZOFRAN) IV, ondansetron (ZOFRAN) IV, ondansetron, traMADol    LOS: 1 day   Jordan Rowe,CHRISTOPHER  [facility] Pager [phone]. If 8PM-8AM, please contact night-coverage at [email or web address], password [facility] 02/02/2013, 7:21 AM  LOS: 1 day

## 2013-02-02 NOTE — Progress Notes (Signed)
Patient's HR has been dropping down to the 40s while he is sleeping. Patient is found snoring and lying in bed. Patient's HR comes back up to the 70s-80s when aroused.

## 2013-02-02 NOTE — Progress Notes (Signed)
Patient has 12 beats of vtach at 0116. RN was in the patient's room at the time and he was asymptomatic lying in bed. RN notified NP, Kirtland Bouchard Schorr, on call. Awaiting any new orders. Patient is now sinus brady and relaxing in bed. RN will continue to monitor HR and rhythm closely.

## 2013-02-02 NOTE — Progress Notes (Addendum)
Hypoglycemic Event  CBG: 67  Treatment: 15 GM carbohydrate snack  Symptoms: Pale  Follow-up CBG: Time: 0219 CBG Result: 62  Possible Reasons for Event: Inadequate meal intake  Comments/MD notified: NP, K. Schorr was notified when the CBG was 73 and trending down from 110. NP changed the patient's fluids to D5 1/2NS. When patient's CBG was 67, RN provided the patient with orange juice and a CBG of 62 resulted. RN then provided the patient with another orange juice and crackers, CBG then came up to 74. Will recheck CBG around 0400 per orders.     Jordan Rowe  Remember to initiate Hypoglycemia Order Set & complete

## 2013-02-02 NOTE — ED Notes (Signed)
Phlebotomy at bedside to obtain labs.

## 2013-02-02 NOTE — H&P (Signed)
PCP: Florentina Jenny   Chief Complaint:  Low blood sugar  HPI: Jordan Rowe is a 67 y.o. male   has a past medical history of Diabetes mellitus; Hypertension; GERD (gastroesophageal reflux disease); Vitamin D deficiency; Constipation; Schizophrenia; Seizure; Edema; Glaucoma; and CAD (coronary artery disease).   Presented with  Hx obtained from records patient has hx of mental illness and mental retardation he is unsure why he is here.  Per ER M.D. patient was found outside of his group home confused and stating he wasn't feeling well. He apparently had not had much to eat or drink all day. But was still given his diabetes medication. His blood sugar was found to be 60 and improved only slightly after some food. She was transferred to [facility] emergency department where he continued to get treatment and cardiac blood sugar is stabilized. She was found to have acute renal failure with a creatinine of 2.1. At this point hospitalist was called on admission. Patient himself unable to provide any of his history.  Review of Systems:  patient is unable to provide  Past Medical History: Past Medical History  Diagnosis Date  . Diabetes mellitus   . Hypertension   . GERD (gastroesophageal reflux disease)   . Vitamin D deficiency   . Constipation   . Schizophrenia   . Seizure   . Edema   . Glaucoma   . CAD (coronary artery disease)    History reviewed. No pertinent past surgical history.   Medications: Prior to Admission medications   Medication Sig Start Date End Date Taking? Authorizing Provider  cholecalciferol (VITAMIN D) 1000 UNITS tablet Take 1,000 Units by mouth daily.   Yes Historical Provider, MD  cycloSPORINE (RESTASIS) 0.05 % ophthalmic emulsion Place 1 drop into both eyes 2 (two) times daily.   Yes Historical Provider, MD  Diclofenac Sodium CR (VOLTAREN-XR) 100 MG 24 hr tablet Take 100 mg by mouth 2 (two) times daily.   Yes Historical Provider, MD  docusate sodium (COLACE) 100  MG capsule Take 100 mg by mouth 2 (two) times daily.   Yes Historical Provider, MD  dorzolamide-timolol (COSOPT) 22.3-6.8 MG/ML ophthalmic solution Place 1 drop into both eyes 2 (two) times daily.   Yes Historical Provider, MD  ferrous sulfate 325 (65 FE) MG tablet Take 325 mg by mouth daily with breakfast.   Yes Historical Provider, MD  furosemide (LASIX) 40 MG tablet Take 40 mg by mouth daily.   Yes Historical Provider, MD  hydrochlorothiazide (HYDRODIURIL) 25 MG tablet Take 25 mg by mouth daily.   Yes Historical Provider, MD  insulin aspart (NOVOLOG) 100 UNIT/ML injection Inject 14-16 Units into the skin 3 (three) times daily with meals. 14 units with breakfast 16 units with lunch & supper   Yes Historical Provider, MD  insulin glargine (LANTUS) 100 UNIT/ML injection Inject 30 Units into the skin at bedtime.    Yes Historical Provider, MD  isosorbide mononitrate (ISMO,MONOKET) 20 MG tablet Take 20 mg by mouth 2 (two) times daily.   Yes Historical Provider, MD  latanoprost (XALATAN) 0.005 % ophthalmic solution Place 1 drop into both eyes at bedtime.   Yes Historical Provider, MD  lisinopril (PRINIVIL,ZESTRIL) 20 MG tablet Take 20 mg by mouth daily.   Yes Historical Provider, MD  metFORMIN (GLUCOPHAGE) 1000 MG tablet Take 1,000 mg by mouth 2 (two) times daily with a meal.   Yes Historical Provider, MD  metoprolol (LOPRESSOR) 100 MG tablet Take 100-200 mg by mouth 2 (two) times daily.  Take 2 tablets in the morning(0700) Take 1 tablet in the evening(1900) Check pulse before giving  And if less than 60 hold dose.   Yes Historical Provider, MD  omeprazole (PRILOSEC) 20 MG capsule Take 20 mg by mouth daily. Take two capsules by mouth once daily 30 min prior to evening meal.   Yes Historical Provider, MD  pioglitazone (ACTOS) 15 MG tablet Take 15 mg by mouth daily.   Yes Historical Provider, MD  risperiDONE (RISPERDAL) 2 MG tablet Take 2 mg by mouth at bedtime.   Yes Historical Provider, MD  senna  (SENOKOT) 8.6 MG TABS Take 2 tablets by mouth at bedtime.   Yes Historical Provider, MD  traMADol (ULTRAM) 50 MG tablet Take 50 mg by mouth every 8 (eight) hours as needed for pain.   Yes Historical Provider, MD  acetaminophen (TYLENOL) 325 MG tablet Take 325 mg by mouth every 6 (six) hours as needed. Take one or two tablets as needed for pain.    Historical Provider, MD  cloNIDine (CATAPRES) 0.1 MG tablet Take 0.1 mg by mouth as directed. Take one tablet by mouth every hour as needed for systolic blood pressure greater than 190 or diastolic blood pressure greater than 100. Not to exceed 0.6mg  in 24hrs.    Historical Provider, MD    Allergies:  No Known Allergies  Social History:  Ambulatory independently  Lives at group home   reports that he has been smoking.  He does not have any smokeless tobacco history on file. He reports that he does not drink alcohol or use illicit drugs.   Family History: family history includes Hypertension in his mother.    Physical Exam: Patient Vitals for the past 24 hrs:  BP Temp Temp src Pulse Resp SpO2  02/01/13 2241 142/94 mmHg - - 85 - -  02/01/13 2239 134/83 mmHg - - 76 - -  02/01/13 2237 123/65 mmHg 98.8 F (37.1 C) Oral 81 16 100 %  02/01/13 2102 115/71 mmHg - Oral 79 16 98 %    1. General:  in No Acute distress 2. Psychological: Alert and   Oriented to self but not situation 3. Head/ENT:    Dry Mucous Membranes                          Head Non traumatic, neck supple                          Normal  Dentition 4. SKIN: normal  Skin turgor,  Skin clean Dry and intact no rash 5. Heart: Regular rate and rhythm no Murmur, Rub or gallop 6. Lungs: Clear to auscultation bilaterally, no wheezes or crackles   7. Abdomen: Soft, non-tender, slightly distended 8. Lower extremities: no clubbing, cyanosis, or edema 9. Neurologically Grossly intact, moving all 4 extremities equally 10. MSK: Normal range of motion  body mass index is unknown because  there is no weight on file.   Labs on Admission:   Recent Labs  02/01/13 2135 02/01/13 2145  NA 138 139  K 3.6 3.6  CL 95* 99  CO2 29  --   GLUCOSE 74 73  BUN 37* 38*  CREATININE 2.09* 2.10*  CALCIUM 9.5  --     Recent Labs  02/01/13 2135  AST 13  ALT 18  ALKPHOS 58  BILITOT 0.2*  PROT 7.5  ALBUMIN 3.6   No results found for this  basename: LIPASE, AMYLASE,  in the last 72 hours  Recent Labs  02/01/13 2135 02/01/13 2145  WBC 12.4*  --   NEUTROABS 9.1*  --   HGB 10.6* 12.6*  HCT 32.7* 37.0*  MCV 83.2  --   PLT 288  --     Recent Labs  02/01/13 2135  TROPONINI <0.30   No results found for this basename: TSH, T4TOTAL, FREET3, T3FREE, THYROIDAB,  in the last 72 hours No results found for this basename: VITAMINB12, FOLATE, FERRITIN, TIBC, IRON, RETICCTPCT,  in the last 72 hours No results found for this basename: HGBA1C    The CrCl is unknown because both a height and weight (above a minimum accepted value) are required for this calculation. ABG    Component Value Date/Time   HCO3 28.8* 02/01/2013 2050   TCO2 29 02/01/2013 2145   O2SAT 39.8 02/01/2013 2050     No results found for this basename: DDIMER     Other results:  I have pearsonaly reviewed this: ECG REPORT  Rate: 81  Rhythm: SR with 1st degree AV block ST&T Change: no ischemia  UA no evidence of infection  Cultures:    Component Value Date/Time   SDES URINE, RANDOM 04/12/2010 2146   SPECREQUEST NONE 04/12/2010 2146   CULT NO GROWTH 04/12/2010 2146   REPTSTATUS 04/14/2010 FINAL 04/12/2010 2146       Radiological Exams on Admission: Dg Chest 2 View  02/01/2013   *RADIOLOGY REPORT*  Clinical Data: Weakness.  CHEST - 2 VIEW  Comparison: Chest radiograph performed 04/12/2010  Findings: The lungs are well-aerated.  Mild vascular congestion is noted.  There is no evidence of focal opacification, pleural effusion or pneumothorax.  The heart is borderline normal in size; the mediastinal contour is  within normal limits.  No acute osseous abnormalities are seen. Chronic healed right-sided rib fractures are noted.  IMPRESSION: Vascular congestion, without significant pulmonary edema.  Lungs remain grossly clear.   Original Report Authenticated By: Tonia Ghent, M.D.   Ct Head Wo Contrast  02/01/2013   *RADIOLOGY REPORT*  Clinical Data: Weakness  CT HEAD WITHOUT CONTRAST  Technique:  Contiguous axial images were obtained from the base of the skull through the vertex without contrast.  Comparison: 02/28/2005  Findings: Mild motion artifact is noted. No acute hemorrhage, acute infarction, or mass lesion is seen.  No skull fracture.  No soft tissue abnormality.  Orbits and paranasal sinuses are grossly intact.  IMPRESSION: No acute intracranial finding.   Original Report Authenticated By: Christiana Pellant, M.D.    Chart has been reviewed  Assessment/Plan  This is a 67 year old gentleman history of schizophrenia who presents with hypoglycemia and acute renal failure after poor by mouth intake throughout the day. Present on Admission:  . Hypoglycemia - will initiate hypoglycemic protocol hold his diabetes medications . Diabetes mellitus - sensitive sliding scale restart once BG stabilizes.  . Dehydration - hold lasix and give gentle IV fluids being mindful of CXR findings of possible venous congestion.  . Acute renal failure - obtain Urine electrolytes and give IVF, follow Cr . HYPERTENSION - stable hold HCTZ, lisinopril and lasix  Prophylaxis:  Lovenox, Protonix  CODE STATUS:FULL CODE  Other plan as per orders.  I have spent a total of 55 min on this admission  Jordan Rowe 02/02/2013, 12:22 AM

## 2013-02-02 NOTE — Progress Notes (Signed)
Patient's CBG at 0608 was 148.

## 2013-02-03 DIAGNOSIS — I10 Essential (primary) hypertension: Secondary | ICD-10-CM

## 2013-02-03 LAB — CBC
HCT: 35.2 % — ABNORMAL LOW (ref 39.0–52.0)
Hemoglobin: 11.3 g/dL — ABNORMAL LOW (ref 13.0–17.0)
MCH: 27 pg (ref 26.0–34.0)
MCHC: 32.1 g/dL (ref 30.0–36.0)
MCV: 84 fL (ref 78.0–100.0)
RBC: 4.19 MIL/uL — ABNORMAL LOW (ref 4.22–5.81)

## 2013-02-03 LAB — RENAL FUNCTION PANEL
Albumin: 3.3 g/dL — ABNORMAL LOW (ref 3.5–5.2)
BUN: 22 mg/dL (ref 6–23)
Calcium: 9.3 mg/dL (ref 8.4–10.5)
Chloride: 97 mEq/L (ref 96–112)
Creatinine, Ser: 1.46 mg/dL — ABNORMAL HIGH (ref 0.50–1.35)
Phosphorus: 3.5 mg/dL (ref 2.3–4.6)

## 2013-02-03 LAB — GLUCOSE, CAPILLARY
Glucose-Capillary: 247 mg/dL — ABNORMAL HIGH (ref 70–99)
Glucose-Capillary: 141 mg/dL — ABNORMAL HIGH (ref 70–99)
Glucose-Capillary: 100 mg/dL — ABNORMAL HIGH (ref 70–99)

## 2013-02-03 MED ORDER — INSULIN NPH (HUMAN) (ISOPHANE) 100 UNIT/ML ~~LOC~~ SUSP
20.0000 [IU] | Freq: Once | SUBCUTANEOUS | Status: AC
  Administered 2013-02-03: 20 [IU] via SUBCUTANEOUS
  Filled 2013-02-03: qty 10

## 2013-02-03 MED ORDER — SODIUM CHLORIDE 0.9 % IV BOLUS (SEPSIS)
250.0000 mL | Freq: Once | INTRAVENOUS | Status: AC
  Administered 2013-02-03: 250 mL via INTRAVENOUS

## 2013-02-03 MED ORDER — FUROSEMIDE 40 MG PO TABS: 40.0000 mg | ORAL_TABLET | Freq: Every day | ORAL | Status: DC

## 2013-02-03 MED ORDER — INSULIN ASPART 100 UNIT/ML ~~LOC~~ SOLN
0.0000 [IU] | Freq: Three times a day (TID) | SUBCUTANEOUS | Status: DC
  Administered 2013-02-03 – 2013-02-04 (×3): 1 [IU] via SUBCUTANEOUS
  Administered 2013-02-04 – 2013-02-05 (×3): 2 [IU] via SUBCUTANEOUS

## 2013-02-03 MED ORDER — ISOSORBIDE MONONITRATE 20 MG PO TABS: 20.0000 mg | ORAL_TABLET | Freq: Two times a day (BID) | ORAL | Status: DC

## 2013-02-03 MED ORDER — INSULIN GLARGINE 100 UNIT/ML ~~LOC~~ SOLN
30.0000 [IU] | Freq: Every day | SUBCUTANEOUS | Status: DC
  Administered 2013-02-03 – 2013-02-04 (×2): 30 [IU] via SUBCUTANEOUS
  Filled 2013-02-03 (×3): qty 0.3

## 2013-02-03 MED ORDER — PNEUMOCOCCAL VAC POLYVALENT 25 MCG/0.5ML IJ INJ
0.5000 mL | INJECTION | INTRAMUSCULAR | Status: AC
  Administered 2013-02-04: 0.5 mL via INTRAMUSCULAR
  Filled 2013-02-03 (×2): qty 0.5

## 2013-02-03 MED ORDER — ISOSORBIDE MONONITRATE 20 MG PO TABS
20.0000 mg | ORAL_TABLET | Freq: Two times a day (BID) | ORAL | Status: DC
  Administered 2013-02-04 – 2013-02-05 (×3): 20 mg via ORAL
  Filled 2013-02-03 (×5): qty 1

## 2013-02-03 MED ORDER — FUROSEMIDE 40 MG PO TABS
40.0000 mg | ORAL_TABLET | Freq: Every day | ORAL | Status: DC
  Administered 2013-02-04 – 2013-02-05 (×2): 40 mg via ORAL
  Filled 2013-02-03 (×2): qty 1

## 2013-02-03 NOTE — Progress Notes (Signed)
Clinical Social Work Department BRIEF PSYCHOSOCIAL ASSESSMENT 02/03/2013  Patient:  PARRIS, CUDWORTH     Account Number:  [id number]     Admit date:  02/01/2013  Clinical Social Worker:  Doroteo Glassman  Date/Time:  02/03/2013 10:47 AM  Referred by:  Physician  Date Referred:  02/03/2013 Referred for  ALF Placement   Other Referral:   Interview type:  Patient Other interview type:    PSYCHOSOCIAL DATA Living Status:  FACILITY Admitted from facility:  [facility] PL[facility] Level of care:  Assisted Living Primary support name:  Peri Maris Primary support relationship to patient:  SIBLING Degree of support available:   unknown    CURRENT CONCERNS Current Concerns  Post-Acute Placement   Other Concerns:    SOCIAL WORK ASSESSMENT / PLAN Met with Pt to discuss d/c plans.    Pt confirmed that he lives at [facility] and that he will return there upon d/c.    CSW thanked Pt for his time.    Spoke with Britta Mccreedy at [facility].    Per Britta Mccreedy, unless Pt's medical status drastically changes, ok for Pt to return upon d/c.   Assessment/plan status:   Other assessment/ plan:   Information/referral to community resources:   n/a--Pt from [employer]    PATIENT'S/FAMILY'S RESPONSE TO PLAN OF CARE: Pt thanked CSW for time and assistance.   Providence Crosby, LCSWA Clinical Social Work [phone]

## 2013-02-03 NOTE — Progress Notes (Signed)
  Echocardiogram 2D Echocardiogram has been performed.  Kashawn Manzano 02/03/2013, 8:46 AM

## 2013-02-03 NOTE — Progress Notes (Signed)
[facility] PROGRESS NOTE  Jordan Rowe ZOX:[id number] DOB: 1945-09-12 DOA: 02/01/2013 PCP: Provider Not In System  Assessment/Plan: Active Problems:   HYPERTENSION   Diabetes mellitus   Dehydration   Acute renal failure   Hypoglycemia    1. DM/Hypoglycemia:  Patient has known insulin-requiring type 2 DM, on Lantus/Metformin, pre-admission, and had reportedly, had poor oral intake on day of admission, in the setting of continued administration of diabetic medication, and ARF. Initial CBG was 60. Insulin/metformin were placed on hold, and he was managed with iv dextrose infusion, with resolution of hypoglycemia. Patient is eating well, and CBGs have climbed into the 300s today. Have therefore, reinstated scheduled Lantus and discontinued Dextrose infusion.  2. Dehydration/ARF: Patient's creatinine was 2.02 at presentation, BUN 36, consistent with ARF, against a known baseline creatinine of 0.66 on 01/13/12. In the setting of NSAID therapy, ACE-i, and diuretics, I suspect that there may be an element of chronicity, especially as patient was clinically, extra-vascularly volume-overloaded as of 02/02/13. IV fluid rate was reduced to 50 mls/hr, and iv lasix commenced. Renal U/S revealed 5 x 2.6 x 3 cm hypoechoic area within the mid central left kidney, possibly representing a renal mass. No evidence of hydronephrosis. Nephrotoxins are on hold. Following renal indices and creatinine is 1.46 today.   3. Renal lesion: As described in #2 above, renal US showed an incidental 5 x 2.6 x 3 cm hypoechoic area within the mid central left kidney, possibly representing a renal mass. Patient will need urology follow up on discharge.  4. HTN: BP was reasonably controlled at presentation, but this AM, dropped to 85/47. All antihypertensives and diuretics were placed on hold, and patient bolused 250 mls NS, with normalization of BP to 137/68. Will observe for now, and gradually re-introduce BP medication. Patient  has a bradycardia of 52-55., due to beta-blockers. Will need to review beta-blocker dose, prior to discharge.  4. GERD: Asymptomatic.    Code Status: Full Code.  Family Communication:  Disposition Plan: to be determined. Possible discharge on 02/05/13.    Brief narrative: 67 y.o. male with history of Diabetes mellitus, Hypertension, GERD, Vitamin D deficiency, Constipation, Schizophrenia, Seizure disorder, Edema, Glaucoma, CAD, mental illness and mental retardation, found outside of his group home confused and stating he wasn't feeling well. He apparently had not had much to eat or drink all day, but was still given his diabetes medication. His blood sugar was found to be 60 and improved only slightly after some food. He was transferred to [facility] emergency department, where he continued to get treatment and blood sugar is stabilized. He was found to have acute renal failure with a creatinine of 2.1. At this point hospitalist was called to admit.   Consultants:  N/A.   Procedures:  CXR.  Head CT scan.  Antibiotics:  N/A.   HPI/Subjective: No new issues.   Objective: Vital signs in last 24 hours: Temp:  [98.1 F (36.7 C)-99 F (37.2 C)] 98.1 F (36.7 C) (06/07 0152) Pulse Rate:  [52-72] 52 (06/07 0950) Resp:  [20] 20 (06/07 0152) BP: (85-137)/(47-81) 85/47 mmHg (06/07 0950) SpO2:  [96 %-100 %] 96 % (06/07 0152) Weight change:  Last BM Date: 02/02/13  Intake/Output from previous day: 06/06 0701 - 06/07 0700 In: 1857.5 [P.O.:600; I.V.:1257.5] Out: 1075 [Urine:1075] Total I/O In: 120 [P.O.:120] Out: -    Physical Exam: General: Comfortable, alert, not short of breath at rest. Patient appears mildly anasarcic.  HEENT:  Mild clinical pallor, no jaundice,  no conjunctival injection or discharge. NECK:  Supple, JVP not seen, no carotid bruits, no palpable lymphadenopathy, no palpable goiter. CHEST:  Clinically clear to auscultation, no wheezes, no crackles. HEART:   Sounds 1 and 2 heard, normal, regular, no murmurs. ABDOMEN:  Distended, soft, non-tender, no palpable organomegaly, no palpable masses, normal bowel sounds. GENITALIA:  Not examined. LOWER EXTREMITIES:  Improved pitting edema, palpable peripheral pulses. MUSCULOSKELETAL SYSTEM:  Generalized osteoarthritic changes, otherwise, normal. CENTRAL NERVOUS SYSTEM:  No focal neurologic deficit on gross examination.  Lab Results:  Recent Labs  02/02/13 0355 02/03/13 0405  WBC 9.6 10.0  HGB 10.2* 11.3*  HCT 31.3* 35.2*  PLT 284 283    Recent Labs  02/02/13 0910 02/03/13 0400  NA 135 136  K 3.3* 3.5  CL 95* 97  CO2 27 30  GLUCOSE 199* 131*  BUN 34* 22  CREATININE 1.78* 1.46*  CALCIUM 8.8 9.3   Recent Results (from the past 240 hour(s))  MRSA PCR SCREENING     Status: None   Collection Time    02/02/13  1:27 AM      Result Value Range Status   MRSA by PCR NEGATIVE  NEGATIVE Final   Comment:            The GeneXpert MRSA Assay (FDA     approved for NASAL specimens     only), is one component of a     comprehensive MRSA colonization     surveillance program. It is not     intended to diagnose MRSA     infection nor to guide or     monitor treatment for     MRSA infections.     Studies/Results: Dg Chest 2 View  02/01/2013   *RADIOLOGY REPORT*  Clinical Data: Weakness.  CHEST - 2 VIEW  Comparison: Chest radiograph performed 04/12/2010  Findings: The lungs are well-aerated.  Mild vascular congestion is noted.  There is no evidence of focal opacification, pleural effusion or pneumothorax.  The heart is borderline normal in size; the mediastinal contour is within normal limits.  No acute osseous abnormalities are seen. Chronic healed right-sided rib fractures are noted.  IMPRESSION: Vascular congestion, without significant pulmonary edema.  Lungs remain grossly clear.   Original Report Authenticated By: Tonia Ghent, M.D.   Ct Head Wo Contrast  02/01/2013   *RADIOLOGY REPORT*   Clinical Data: Weakness  CT HEAD WITHOUT CONTRAST  Technique:  Contiguous axial images were obtained from the base of the skull through the vertex without contrast.  Comparison: 02/28/2005  Findings: Mild motion artifact is noted. No acute hemorrhage, acute infarction, or mass lesion is seen.  No skull fracture.  No soft tissue abnormality.  Orbits and paranasal sinuses are grossly intact.  IMPRESSION: No acute intracranial finding.   Original Report Authenticated By: Christiana Pellant, M.D.   US Renal  02/02/2013   *RADIOLOGY REPORT*  Clinical Data: 67 year old male with renal failure.  RENAL/URINARY TRACT ULTRASOUND COMPLETE  Comparison:  None  Findings:  Right Kidney:  The right kidney is normal in echogenicity and size measuring 13 cm.  There is no evidence of hydronephrosis, solid renal mass or definite renal calculi.  Left Kidney:  The left kidney is normal in echogenicity and size measuring 14.3 cm.  A 5 x 2.6 x 3 cm hypoechoic area within the mid central left kidney is noted and a mass is not excluded.  There is no evidence of hydronephrosis or definite renal calculi.  Bladder:  Normal  for degree of filling.  IMPRESSION: 5 x 2.6 x 3 cm hypoechoic area within the mid central left kidney - possibly representing a renal mass. Elective MRI with and without contrast is recommended when the patient's renal function improves.  No evidence of hydronephrosis.   Original Report Authenticated By: Harmon Pier, M.D.    Medications: Scheduled Meds: . antiseptic oral rinse  15 mL Mouth Rinse BID  . cycloSPORINE  1 drop Both Eyes BID  . docusate sodium  100 mg Oral BID  . dorzolamide-timolol  1 drop Both Eyes BID  . enoxaparin (LOVENOX) injection  40 mg Subcutaneous Q24H  . ferrous sulfate  325 mg Oral Q breakfast  . insulin aspart  0-9 Units Subcutaneous Q4H  . latanoprost  1 drop Both Eyes QHS  . pantoprazole  40 mg Oral Daily  . potassium chloride  20 mEq Oral BID  . risperiDONE  2 mg Oral QHS  . senna  2  tablet Oral QHS  . sodium chloride  3 mL Intravenous Q12H   Continuous Infusions:   PRN Meds:.acetaminophen, acetaminophen, dextrose, glucose-Vitamin C, HYDROcodone-acetaminophen, ondansetron (ZOFRAN) IV, ondansetron, traMADol    LOS: 2 days   Ameah Chanda,CHRISTOPHER  [facility] Pager [phone]. If 8PM-8AM, please contact night-coverage at [email or web address], password [facility] 02/03/2013, 12:17 PM  LOS: 2 days

## 2013-02-04 LAB — CBC
HCT: 35.7 % — ABNORMAL LOW (ref 39.0–52.0)
Hemoglobin: 11.5 g/dL — ABNORMAL LOW (ref 13.0–17.0)
MCH: 27.1 pg (ref 26.0–34.0)
RBC: 4.24 MIL/uL (ref 4.22–5.81)

## 2013-02-04 LAB — RENAL FUNCTION PANEL
BUN: 14 mg/dL (ref 6–23)
CO2: 29 mEq/L (ref 19–32)
Calcium: 9 mg/dL (ref 8.4–10.5)
GFR calc Af Amer: 76 mL/min — ABNORMAL LOW (ref >90)
Glucose, Bld: 106 mg/dL — ABNORMAL HIGH (ref 70–99)
Sodium: 140 mEq/L (ref 135–145)

## 2013-02-04 LAB — GLUCOSE, CAPILLARY: Glucose-Capillary: 131 mg/dL — ABNORMAL HIGH (ref 70–99)

## 2013-02-04 NOTE — Discharge Summary (Signed)
Physician Discharge Summary  Jordan Rowe ZOX:[id number] DOB: Jan 17, 1946 DOA: 02/01/2013  PCP: Provider Not In System  Admit date: 02/01/2013 Discharge date: 02/05/2013  Time spent: 40 minutes  Recommendations for Outpatient Follow-up:  1. Follow up with PMD. 2. Follow up with Dr Jethro Bolus et al, urologist. Office will contact patient with time and date.   Discharge Diagnoses:  Active Problems:   HYPERTENSION   Diabetes mellitus   Dehydration   Acute renal failure   Hypoglycemia   Discharge Condition: Satisfactory.  Diet recommendation: Heart-Health/Carbohydrate-Modified.  Filed Weights   02/02/13 0056  Weight: 97.1 kg (214 lb 1.1 oz)    History of present illness:  67 y.o. male with history of Diabetes mellitus, Hypertension, GERD, Vitamin D deficiency, Constipation, Schizophrenia, Seizure disorder, Edema, Glaucoma, CAD, mental illness and mental retardation, found outside of his group home confused and stating he wasn't feeling well. He apparently had not had much to eat or drink all day, but was still given his diabetes medication. His blood sugar was found to be 60 and improved only slightly after some food. He was transferred to [facility] emergency department, where he continued to get treatment and blood sugar is stabilized. He was found to have acute renal failure with a creatinine of 2.1. At this point hospitalist was called to admit.   Hospital Course:  1. DM/Hypoglycemia: Patient has known insulin-requiring type 2 DM, on Lantus/Metformin, pre-admission, and had reportedly, had poor oral intake on day of admission, in the setting of continued administration of diabetic medication, and ARF. Initial CBG was 60. Insulin/Metformin were placed on hold, and he was managed with iv Dextrose infusion, with resolution of hypoglycemia. As of 02/02/13, patient was eating well, CBGs had normalized, and as CBGs climbed into the 300s on 02/03/13, we were able to discontinue dextrose  infusion, and reinstate scheduled Lantus, in pre-admission dosage. CBGs thereafter remained satisfactory and controlled, without recurrence of hypoglycemia. Will reinstate Metformin on discharge, but have discontinued Actos.  2. Dehydration/ARF: Patient's creatinine was 2.02 at presentation, BUN 36, consistent with ARF, against a known baseline creatinine of 0.66 on 01/13/12. In the setting of NSAID therapy, ACE-i, and diuretics, there was some suspicion that there may be an element of chronicity. Gentle iv fluids were administered initially, but as patient was clinically, extra-vascularly volume-overloaded as of 02/02/13, iv Lasix commenced, with satisfactory clinical response and resolution of fluid overload. As of 02/04/13, creatinine has normalized at 1.14. NSAIDS have been discontinued.  3. Renal lesion: Renal U/S showed an incidental 5 x 2.6 x 3 cm hypoechoic area within the mid central left kidney, possibly representing a renal mass. There was no hydronephrosis. I have discussed these findings with Dr Jethro Bolus, urologist, on 02/04/13, and he has kindly agreed to have patient follow up with him in the office, on discharge.  4. HTN: BP was reasonably controlled at presentation, but in AM of 02/03/13, dropped to 85/47. All antihypertensives and diuretics were placed on hold, and patient bolused 250 mls NS, with normalization of BP to 137/68. Gradually re-introduced BP medication, with some dosage modifcation. Patient had a bradycardia of 52-55/min due to beta-blockers, and this has now improved into the 70s. With resolution of ARF, Lisinopril has been re-introduced in a dose of 5 mg daily, mainly for renal protection in this diabetic, and Lopressor has been reduced to 12.5 mg BID. Will defer to PMD to follow BP and adjust medications further, if needed. BP was 123/76 on discharge.  4. GERD: Asymptomatic.  Procedures:  See Below.   Consultations:  Telephone discussion with Dr Patsi Sears,  urologist, on 02/04/13.   Discharge Exam: Filed Vitals:   02/04/13 0505 02/04/13 1409 02/04/13 2224 02/05/13 0533  BP: 134/82 132/74 133/74 123/76  Pulse: 72 91 65 93  Temp: 98.3 F (36.8 C) 98.4 F (36.9 C) 98.3 F (36.8 C) 98.3 F (36.8 C)  TempSrc: Oral Oral Oral Oral  Resp: 16 18 16 20   Height:      Weight:      SpO2: 100% 100% 100% 98%    General: Comfortable, alert, not short of breath at rest.  HEENT: Mild clinical pallor, no jaundice, no conjunctival injection or discharge.  NECK: Supple, JVP not seen, no carotid bruits, no palpable lymphadenopathy, no palpable goiter.  CHEST: Clinically clear to auscultation, no wheezes, no crackles.  HEART: Sounds 1 and 2 heard, normal, regular, no murmurs.  ABDOMEN: Distended, soft, non-tender, no palpable organomegaly, no palpable masses, normal bowel sounds.  GENITALIA: Not examined.  LOWER EXTREMITIES: Only very minimal pitting edema, palpable peripheral pulses.  MUSCULOSKELETAL SYSTEM: Generalized osteoarthritic changes, otherwise, normal.  CENTRAL NERVOUS SYSTEM: No focal neurologic deficit on gross examination.  Discharge Instructions      Discharge Orders   Future Orders Complete By Expires     Diet - low sodium heart healthy  As directed     Diet Carb Modified  As directed     Increase activity slowly  As directed         Medication List    STOP taking these medications       hydrochlorothiazide 25 MG tablet  Commonly known as:  HYDRODIURIL     pioglitazone 15 MG tablet  Commonly known as:  ACTOS     VOLTAREN-XR 100 MG 24 hr tablet  Generic drug:  Diclofenac Sodium CR      TAKE these medications       acetaminophen 325 MG tablet  Commonly known as:  TYLENOL  Take 325 mg by mouth every 6 (six) hours as needed. Take one or two tablets as needed for pain.     cholecalciferol 1000 UNITS tablet  Commonly known as:  VITAMIN D  Take 1,000 Units by mouth daily.     cloNIDine 0.1 MG tablet  Commonly known as:   CATAPRES  Take 0.1 mg by mouth as directed. Take one tablet by mouth every hour as needed for systolic blood pressure greater than 190 or diastolic blood pressure greater than 100. Not to exceed 0.6mg  in 24hrs.     cycloSPORINE 0.05 % ophthalmic emulsion  Commonly known as:  RESTASIS  Place 1 drop into both eyes 2 (two) times daily.     docusate sodium 100 MG capsule  Commonly known as:  COLACE  Take 100 mg by mouth 2 (two) times daily.     dorzolamide-timolol 22.3-6.8 MG/ML ophthalmic solution  Commonly known as:  COSOPT  Place 1 drop into both eyes 2 (two) times daily.     ferrous sulfate 325 (65 FE) MG tablet  Take 325 mg by mouth daily with breakfast.     furosemide 40 MG tablet  Commonly known as:  LASIX  Take 40 mg by mouth daily.     insulin aspart 100 UNIT/ML injection  Commonly known as:  novoLOG  Inject 14-16 Units into the skin 3 (three) times daily with meals. 14 units with breakfast  16 units with lunch & supper     insulin glargine 100 UNIT/ML injection  Commonly known as:  LANTUS  Inject 30 Units into the skin at bedtime.     isosorbide mononitrate 20 MG tablet  Commonly known as:  ISMO,MONOKET  Take 20 mg by mouth 2 (two) times daily.     latanoprost 0.005 % ophthalmic solution  Commonly known as:  XALATAN  Place 1 drop into both eyes at bedtime.     lisinopril 5 MG tablet  Commonly known as:  PRINIVIL,ZESTRIL  Take 1 tablet (5 mg total) by mouth daily.     metFORMIN 1000 MG tablet  Commonly known as:  GLUCOPHAGE  Take 1,000 mg by mouth 2 (two) times daily with a meal.     metoprolol tartrate 12.5 mg Tabs  Commonly known as:  LOPRESSOR  Take 0.5 tablets (12.5 mg total) by mouth 2 (two) times daily.     omeprazole 20 MG capsule  Commonly known as:  PRILOSEC  Take 20 mg by mouth daily. Take two capsules by mouth once daily 30 min prior to evening meal.     risperiDONE 2 MG tablet  Commonly known as:  RISPERDAL  Take 2 mg by mouth at bedtime.      senna 8.6 MG Tabs  Commonly known as:  SENOKOT  Take 2 tablets by mouth at bedtime.     traMADol 50 MG tablet  Commonly known as:  ULTRAM  Take 50 mg by mouth every 8 (eight) hours as needed for pain.       No Known Allergies Follow-up Information   Please follow up. (Follow up with primary MD. )       Follow up with Jethro Bolus I, MD. (Office will contact you with time and date. )    Contact information:   [address], [address] [place] [address] [phone]        The results of significant diagnostics from this hospitalization (including imaging, microbiology, ancillary and laboratory) are listed below for reference.    Significant Diagnostic Studies: Dg Chest 2 View  02/01/2013   *RADIOLOGY REPORT*  Clinical Data: Weakness.  CHEST - 2 VIEW  Comparison: Chest radiograph performed 04/12/2010  Findings: The lungs are well-aerated.  Mild vascular congestion is noted.  There is no evidence of focal opacification, pleural effusion or pneumothorax.  The heart is borderline normal in size; the mediastinal contour is within normal limits.  No acute osseous abnormalities are seen. Chronic healed right-sided rib fractures are noted.  IMPRESSION: Vascular congestion, without significant pulmonary edema.  Lungs remain grossly clear.   Original Report Authenticated By: Tonia Ghent, M.D.   Ct Head Wo Contrast  02/01/2013   *RADIOLOGY REPORT*  Clinical Data: Weakness  CT HEAD WITHOUT CONTRAST  Technique:  Contiguous axial images were obtained from the base of the skull through the vertex without contrast.  Comparison: 02/28/2005  Findings: Mild motion artifact is noted. No acute hemorrhage, acute infarction, or mass lesion is seen.  No skull fracture.  No soft tissue abnormality.  Orbits and paranasal sinuses are grossly intact.  IMPRESSION: No acute intracranial finding.   Original Report Authenticated By: Christiana Pellant, M.D.   US  Renal  02/02/2013   *RADIOLOGY REPORT*  Clinical Data: 67 year old male with renal failure.  RENAL/URINARY TRACT ULTRASOUND COMPLETE  Comparison:  None  Findings:  Right Kidney:  The right kidney is normal in  echogenicity and size measuring 13 cm.  There is no evidence of hydronephrosis, solid renal mass or definite renal calculi.  Left Kidney:  The left kidney is normal in echogenicity and size measuring 14.3 cm.  A 5 x 2.6 x 3 cm hypoechoic area within the mid central left kidney is noted and a mass is not excluded.  There is no evidence of hydronephrosis or definite renal calculi.  Bladder:  Normal for degree of filling.  IMPRESSION: 5 x 2.6 x 3 cm hypoechoic area within the mid central left kidney - possibly representing a renal mass. Elective MRI with and without contrast is recommended when the patient's renal function improves.  No evidence of hydronephrosis.   Original Report Authenticated By: Harmon Pier, M.D.    Microbiology: Recent Results (from the past 240 hour(s))  MRSA PCR SCREENING     Status: None   Collection Time    02/02/13  1:27 AM      Result Value Range Status   MRSA by PCR NEGATIVE  NEGATIVE Final   Comment:            The GeneXpert MRSA Assay (FDA     approved for NASAL specimens     only), is one component of a     comprehensive MRSA colonization     surveillance program. It is not     intended to diagnose MRSA     infection nor to guide or     monitor treatment for     MRSA infections.     Labs: Basic Metabolic Panel:  Recent Labs Lab 02/01/13 2145 02/02/13 0355 02/02/13 0910 02/03/13 0400 02/04/13 0520 02/05/13 0436  NA 139 135 135 136 140 140  K 3.6 3.2* 3.3* 3.5 3.6 4.2  CL 99 95* 95* 97 104 105  CO2  --  29 27 30 29 25   GLUCOSE 73 167* 199* 131* 106* 204*  BUN 38* 36* 34* 22 14 14   CREATININE 2.10* 2.02* 1.78* 1.46* 1.14 1.06  CALCIUM  --  8.9 8.8 9.3 9.0 9.2  MG  --  2.1  --   --   --   --   PHOS  --  4.9* 4.0 3.5 3.1  --    Liver Function  Tests:  Recent Labs Lab 02/01/13 2135 02/02/13 0355 02/02/13 0910 02/03/13 0400 02/04/13 0520  AST 13 9  --   --   --   ALT 18 14  --   --   --   ALKPHOS 58 52  --   --   --   BILITOT 0.2* 0.2*  --   --   --   PROT 7.5 6.3  --   --   --   ALBUMIN 3.6 3.0* 3.1* 3.3* 3.2*   No results found for this basename: LIPASE, AMYLASE,  in the last 168 hours No results found for this basename: AMMONIA,  in the last 168 hours CBC:  Recent Labs Lab 02/01/13 2135 02/01/13 2145 02/02/13 0355 02/03/13 0405 02/04/13 0520  WBC 12.4*  --  9.6 10.0 7.4  NEUTROABS 9.1*  --   --   --   --   HGB 10.6* 12.6* 10.2* 11.3* 11.5*  HCT 32.7* 37.0* 31.3* 35.2* 35.7*  MCV 83.2  --  83.2 84.0 84.2  PLT 288  --  284 283 298   Cardiac Enzymes:  Recent Labs Lab 02/01/13 2135 02/02/13 0005 02/02/13 0355 02/02/13 0910 02/02/13 1602  CKTOTAL  --  88  --   --   --  TROPONINI <0.30  --  <0.30 <0.30 <0.30   BNP: BNP (last 3 results)  Recent Labs  02/01/13 2056  PROBNP 192.6*   CBG:  Recent Labs Lab 02/04/13 0743 02/04/13 1130 02/04/13 1657 02/04/13 2154 02/05/13 0744  GLUCAP 131* 146* 192* 190* 175*       Signed:  Sheridan Gettel,CHRISTOPHER  [facility] 02/05/2013, 11:26 AM

## 2013-02-04 NOTE — Progress Notes (Signed)
[facility] PROGRESS NOTE  Jordan Rowe ZOX:[id number] DOB: 10-03-1945 DOA: 02/01/2013 PCP: Provider Not In System  Assessment/Plan: Active Problems:   HYPERTENSION   Diabetes mellitus   Dehydration   Acute renal failure   Hypoglycemia    1. DM/Hypoglycemia:  Patient has known insulin-requiring type 2 DM, on Lantus/Metformin, pre-admission, and had reportedly, had poor oral intake on day of admission, in the setting of continued administration of diabetic medication, and ARF. Initial CBG was 60. Insulin/metformin were placed on hold, and he was managed with iv dextrose infusion, with resolution of hypoglycemia. Patient is eating well, and as CBGs climbed into the 300s on 02/03/13, we reinstated scheduled Lantus and discontinued Dextrose infusion. Clinical response was satisfactory and CBGs are controlled today. Will reinstate Metformin on discharge, but discontinue Actos.  2. Dehydration/ARF: Patient's creatinine was 2.02 at presentation, BUN 36, consistent with ARF, against a known baseline creatinine of 0.66 on 01/13/12. In the setting of NSAID therapy, ACE-i, and diuretics, I suspect that there may be an element of chronicity, especially as patient was clinically, extra-vascularly volume-overloaded as of 02/02/13. IV fluid rate was reduced to 50 mls/hr, and iv lasix commenced. Renal U/S revealed 5 x 2.6 x 3 cm hypoechoic area within the mid central left kidney, possibly representing a renal mass. No evidence of hydronephrosis. Nephrotoxins are on hold. Following renal indices and creatinine has normalized at 1.14 as of 02/04/13. NSAIDS have been discontinued.  3. Renal lesion: As described in #2 above, renal US showed an incidental 5 x 2.6 x 3 cm hypoechoic area within the mid central left kidney, possibly representing a renal mass. Urology follow up on discharge, will be arranged.  4. HTN: BP was reasonably controlled at presentation, but in AM of 02/03/13, dropped to 85/47. All antihypertensives  and diuretics were placed on hold, and patient bolused 250 mls NS, with normalization of BP to 137/68. Gradually re-introduced BP medication, with some dosage modifcation. Patient had a bradycardia of 52-55, due to beta-blockers. Will need to review beta-blocker dose, prior to discharge.  4. GERD: Asymptomatic.    Code Status: Full Code.  Family Communication:  Disposition Plan: to be determined. Possible discharge on 02/05/13.    Brief narrative: 67 y.o. male with history of Diabetes mellitus, Hypertension, GERD, Vitamin D deficiency, Constipation, Schizophrenia, Seizure disorder, Edema, Glaucoma, CAD, mental illness and mental retardation, found outside of his group home confused and stating he wasn't feeling well. He apparently had not had much to eat or drink all day, but was still given his diabetes medication. His blood sugar was found to be 60 and improved only slightly after some food. He was transferred to [facility] emergency department, where he continued to get treatment and blood sugar is stabilized. He was found to have acute renal failure with a creatinine of 2.1. At this point hospitalist was called to admit.   Consultants:  N/A.   Procedures:  CXR.  Head CT scan.  Antibiotics:  N/A.   HPI/Subjective: No new issues.   Objective: Vital signs in last 24 hours: Temp:  [98.3 F (36.8 C)-98.9 F (37.2 C)] 98.3 F (36.8 C) (06/08 0505) Pulse Rate:  [46-72] 72 (06/08 0505) Resp:  [16-18] 16 (06/08 0505) BP: (123-137)/(65-82) 134/82 mmHg (06/08 0505) SpO2:  [100 %] 100 % (06/08 0505) Weight change:  Last BM Date: 02/02/13  Intake/Output from previous day: 06/07 0701 - 06/08 0700 In: 723 [P.O.:720; I.V.:3] Out: -  Total I/O In: 120 [P.O.:120] Out: -  Physical Exam: General: Comfortable, alert, not short of breath at rest. Patient appears mildly anasarcic.  HEENT:  Mild clinical pallor, no jaundice, no conjunctival injection or discharge. NECK:  Supple, JVP  not seen, no carotid bruits, no palpable lymphadenopathy, no palpable goiter. CHEST:  Clinically clear to auscultation, no wheezes, no crackles. HEART:  Sounds 1 and 2 heard, normal, regular, no murmurs. ABDOMEN:  Distended, soft, non-tender, no palpable organomegaly, no palpable masses, normal bowel sounds. GENITALIA:  Not examined. LOWER EXTREMITIES:  Improved pitting edema, palpable peripheral pulses. MUSCULOSKELETAL SYSTEM:  Generalized osteoarthritic changes, otherwise, normal. CENTRAL NERVOUS SYSTEM:  No focal neurologic deficit on gross examination.  Lab Results:  Recent Labs  02/03/13 0405 02/04/13 0520  WBC 10.0 7.4  HGB 11.3* 11.5*  HCT 35.2* 35.7*  PLT 283 298    Recent Labs  02/03/13 0400 02/04/13 0520  NA 136 140  K 3.5 3.6  CL 97 104  CO2 30 29  GLUCOSE 131* 106*  BUN 22 14  CREATININE 1.46* 1.14  CALCIUM 9.3 9.0   Recent Results (from the past 240 hour(s))  MRSA PCR SCREENING     Status: None   Collection Time    02/02/13  1:27 AM      Result Value Range Status   MRSA by PCR NEGATIVE  NEGATIVE Final   Comment:            The GeneXpert MRSA Assay (FDA     approved for NASAL specimens     only), is one component of a     comprehensive MRSA colonization     surveillance program. It is not     intended to diagnose MRSA     infection nor to guide or     monitor treatment for     MRSA infections.     Studies/Results: US Renal  02/02/2013   *RADIOLOGY REPORT*  Clinical Data: 67 year old male with renal failure.  RENAL/URINARY TRACT ULTRASOUND COMPLETE  Comparison:  None  Findings:  Right Kidney:  The right kidney is normal in echogenicity and size measuring 13 cm.  There is no evidence of hydronephrosis, solid renal mass or definite renal calculi.  Left Kidney:  The left kidney is normal in echogenicity and size measuring 14.3 cm.  A 5 x 2.6 x 3 cm hypoechoic area within the mid central left kidney is noted and a mass is not excluded.  There is no evidence  of hydronephrosis or definite renal calculi.  Bladder:  Normal for degree of filling.  IMPRESSION: 5 x 2.6 x 3 cm hypoechoic area within the mid central left kidney - possibly representing a renal mass. Elective MRI with and without contrast is recommended when the patient's renal function improves.  No evidence of hydronephrosis.   Original Report Authenticated By: Harmon Pier, M.D.    Medications: Scheduled Meds: . antiseptic oral rinse  15 mL Mouth Rinse BID  . cycloSPORINE  1 drop Both Eyes BID  . docusate sodium  100 mg Oral BID  . dorzolamide-timolol  1 drop Both Eyes BID  . enoxaparin (LOVENOX) injection  40 mg Subcutaneous Q24H  . ferrous sulfate  325 mg Oral Q breakfast  . furosemide  40 mg Oral Daily  . insulin aspart  0-9 Units Subcutaneous TID AC & HS  . insulin glargine  30 Units Subcutaneous QHS  . isosorbide mononitrate  20 mg Oral BID  . latanoprost  1 drop Both Eyes QHS  . pantoprazole  40 mg Oral  Daily  . potassium chloride  20 mEq Oral BID  . risperiDONE  2 mg Oral QHS  . senna  2 tablet Oral QHS  . sodium chloride  3 mL Intravenous Q12H   Continuous Infusions:   PRN Meds:.acetaminophen, acetaminophen, dextrose, glucose-Vitamin C, HYDROcodone-acetaminophen, ondansetron (ZOFRAN) IV, ondansetron, traMADol    LOS: 3 days   Jordan Rowe,CHRISTOPHER  [facility] Pager [phone]. If 8PM-8AM, please contact night-coverage at [email or web address], password [facility] 02/04/2013, 11:16 AM  LOS: 3 days

## 2013-02-05 DIAGNOSIS — N2889 Other specified disorders of kidney and ureter: Secondary | ICD-10-CM | POA: Diagnosis present

## 2013-02-05 DIAGNOSIS — N289 Disorder of kidney and ureter, unspecified: Secondary | ICD-10-CM

## 2013-02-05 LAB — BASIC METABOLIC PANEL
BUN: 14 mg/dL (ref 6–23)
Calcium: 9.2 mg/dL (ref 8.4–10.5)
GFR calc non Af Amer: 72 mL/min — ABNORMAL LOW (ref >90)
Glucose, Bld: 204 mg/dL — ABNORMAL HIGH (ref 70–99)
Sodium: 140 mEq/L (ref 135–145)

## 2013-02-05 LAB — GLUCOSE, CAPILLARY: Glucose-Capillary: 236 mg/dL — ABNORMAL HIGH (ref 70–99)

## 2013-02-05 MED ORDER — METOPROLOL TARTRATE 12.5 MG HALF TABLET: 12.5000 mg | ORAL_TABLET | Freq: Two times a day (BID) | ORAL | Status: DC

## 2013-02-05 MED ORDER — LISINOPRIL 5 MG PO TABS: 5.0000 mg | ORAL_TABLET | Freq: Every day | ORAL | Status: DC

## 2013-02-05 MED ORDER — METOPROLOL TARTRATE 12.5 MG HALF TABLET
12.5000 mg | ORAL_TABLET | Freq: Two times a day (BID) | ORAL | Status: DC
  Administered 2013-02-05: 12.5 mg via ORAL
  Filled 2013-02-05 (×2): qty 1

## 2013-02-05 MED ORDER — LISINOPRIL 5 MG PO TABS
5.0000 mg | ORAL_TABLET | Freq: Every day | ORAL | Status: DC
  Administered 2013-02-05: 5 mg via ORAL
  Filled 2013-02-05: qty 1

## 2013-02-05 NOTE — Progress Notes (Signed)
Pt had one short burst of fast rhythm last evening and none since.  See strip in chart.  Berton Bon RN-BC

## 2013-02-05 NOTE — Progress Notes (Signed)
Patient is set to discharge back to [facility] today. CSW confirmed with Britta Mccreedy @ ALF that he is ok to return, facility to pick up patient @ 12:00. Patient & brother, Ethelene Browns made aware.   Unice Bailey, LCSW [facility] Clinical Social Worker cell #: [phone]

## 2013-09-14 ENCOUNTER — Emergency Department (HOSPITAL_COMMUNITY)
Admission: EM | Admit: 2013-09-14 | Discharge: 2013-09-15 | Disposition: A | Discharge status: Home / Self Care | Payer: Medicare Other | Attending: Emergency Medicine | Admitting: Emergency Medicine

## 2013-09-14 ENCOUNTER — Encounter (HOSPITAL_COMMUNITY): Payer: Self-pay | Admitting: Emergency Medicine

## 2013-09-14 ENCOUNTER — Emergency Department (HOSPITAL_COMMUNITY): Payer: Medicare Other

## 2013-09-14 DIAGNOSIS — F17200 Nicotine dependence, unspecified, uncomplicated: Secondary | ICD-10-CM | POA: Insufficient documentation

## 2013-09-14 DIAGNOSIS — I2510 Atherosclerotic heart disease of native coronary artery without angina pectoris: Secondary | ICD-10-CM | POA: Insufficient documentation

## 2013-09-14 DIAGNOSIS — K5900 Constipation, unspecified: Secondary | ICD-10-CM | POA: Insufficient documentation

## 2013-09-14 DIAGNOSIS — Z79899 Other long term (current) drug therapy: Secondary | ICD-10-CM | POA: Insufficient documentation

## 2013-09-14 DIAGNOSIS — K219 Gastro-esophageal reflux disease without esophagitis: Secondary | ICD-10-CM | POA: Insufficient documentation

## 2013-09-14 DIAGNOSIS — Z794 Long term (current) use of insulin: Secondary | ICD-10-CM | POA: Insufficient documentation

## 2013-09-14 DIAGNOSIS — F209 Schizophrenia, unspecified: Secondary | ICD-10-CM | POA: Insufficient documentation

## 2013-09-14 DIAGNOSIS — E559 Vitamin D deficiency, unspecified: Secondary | ICD-10-CM | POA: Insufficient documentation

## 2013-09-14 DIAGNOSIS — H409 Unspecified glaucoma: Secondary | ICD-10-CM | POA: Insufficient documentation

## 2013-09-14 DIAGNOSIS — E119 Type 2 diabetes mellitus without complications: Secondary | ICD-10-CM | POA: Insufficient documentation

## 2013-09-14 DIAGNOSIS — I10 Essential (primary) hypertension: Secondary | ICD-10-CM | POA: Insufficient documentation

## 2013-09-14 MED ORDER — BISACODYL 10 MG RE SUPP
10.0000 mg | Freq: Once | RECTAL | Status: AC
  Administered 2013-09-15: 10 mg via RECTAL
  Filled 2013-09-14: qty 1

## 2013-09-14 MED ORDER — LACTULOSE 10 GM/15ML PO SOLN
20.0000 g | Freq: Once | ORAL | Status: AC
  Administered 2013-09-15: 20 g via ORAL
  Filled 2013-09-14: qty 30

## 2013-09-14 NOTE — ED Notes (Signed)
Bed: WA01 Expected date:  Expected time:  Means of arrival:  Comments: EMS constipation from [facility]

## 2013-09-14 NOTE — ED Notes (Signed)
Blood pressure prior to arrival by EMS  170/98,  History of chronic intermittent constipation,  Pt currently taken tramadol

## 2013-09-14 NOTE — ED Provider Notes (Signed)
CSN: [id number]     Arrival date & time 09/14/13  2005 History   First MD Initiated Contact with Patient 09/14/13 2118     Chief Complaint  Patient presents with  . Constipation   (Consider location/radiation/quality/duration/timing/severity/associated sxs/prior Treatment) The history is provided by the patient and medical records. No language interpreter was used.    Jordan Rowe is a 68 y.o. male  with a hx of IDDM, HTN, GERD, constipation, seizure, glaucoma and CAD presents to the Emergency Department complaining of gradual, persistent, progressively worsening constipation onset 2 mos ago.  Pt reports intermittent hx of same.  He has taken colace and senokot without relief.  He reports no BM in 2 mos, but denies abd pain, N/V/D.  Pt takes tamadol on a regular basis, but denies other opiate mediations.  Pt denies fever, chills, headache, neck pain, chest pain, SOB, abd pain, N/V/D, weakness, dizziness, syncope, dysuria.       Past Medical History  Diagnosis Date  . Diabetes mellitus   . Hypertension   . GERD (gastroesophageal reflux disease)   . Vitamin D deficiency   . Constipation   . Schizophrenia   . Seizure   . Edema   . Glaucoma   . CAD (coronary artery disease)    History reviewed. No pertinent past surgical history. Family History  Problem Relation Age of Onset  . Hypertension Mother    History  Substance Use Topics  . Smoking status: Current Every Day Smoker  . Smokeless tobacco: Not on file  . Alcohol Use: No    Review of Systems  Constitutional: Negative for fever, diaphoresis, appetite change, fatigue and unexpected weight change.  HENT: Negative for mouth sores.   Eyes: Negative for visual disturbance.  Respiratory: Negative for cough, chest tightness, shortness of breath and wheezing.   Cardiovascular: Negative for chest pain.  Gastrointestinal: Positive for constipation. Negative for nausea, vomiting, abdominal pain and diarrhea.  Endocrine: Negative  for polydipsia, polyphagia and polyuria.  Genitourinary: Negative for dysuria, urgency, frequency and hematuria.  Musculoskeletal: Negative for back pain and neck stiffness.  Skin: Negative for rash.  Allergic/Immunologic: Negative for immunocompromised state.  Neurological: Negative for syncope, light-headedness and headaches.  Hematological: Does not bruise/bleed easily.  Psychiatric/Behavioral: Negative for sleep disturbance. The patient is not nervous/anxious.     Allergies  Review of patient's allergies indicates no known allergies.  Home Medications   Current Outpatient Rx  Name  Route  Sig  Dispense  Refill  . cholecalciferol (VITAMIN D) 1000 UNITS tablet   Oral   Take 1,000 Units by mouth daily.         . cycloSPORINE (RESTASIS) 0.05 % ophthalmic emulsion      1 drop 2 (two) times daily.         Marland Kitchen docusate sodium (COLACE) 100 MG capsule   Oral   Take 100 mg by mouth 2 (two) times daily.         . dorzolamide-timolol (COSOPT) 22.3-6.8 MG/ML ophthalmic solution   Both Eyes   Place 1 drop into both eyes 2 (two) times daily.         . ferrous sulfate 325 (65 FE) MG tablet   Oral   Take 325 mg by mouth daily with breakfast.         . finasteride (PROSCAR) 5 MG tablet   Oral   Take 5 mg by mouth daily.         . furosemide (LASIX) 40 MG tablet  Oral   Take 40 mg by mouth every morning.          . insulin aspart (NOVOLOG FLEXPEN) 100 UNIT/ML FlexPen   Subcutaneous   Inject 8 Units into the skin 3 (three) times daily with meals. Hold if bs is <80         . insulin glargine (LANTUS) 100 UNIT/ML injection   Subcutaneous   Inject 22 Units into the skin at bedtime.          . isosorbide mononitrate (ISMO,MONOKET) 20 MG tablet   Oral   Take 20 mg by mouth 2 (two) times daily.         Marland Kitchen. latanoprost (XALATAN) 0.005 % ophthalmic solution   Both Eyes   Place 1 drop into both eyes at bedtime.         Marland Kitchen. lisinopril (PRINIVIL,ZESTRIL) 5 MG  tablet   Oral   Take 1 tablet (5 mg total) by mouth daily.   30 tablet   0   . metoprolol tartrate (LOPRESSOR) 25 MG tablet   Oral   Take 12.5 mg by mouth 2 (two) times daily.         Marland Kitchen. omeprazole (PRILOSEC) 20 MG capsule   Oral   Take 40 mg by mouth every evening. 30 min prior to evening meal.         . risperiDONE (RISPERDAL) 2 MG tablet   Oral   Take 2 mg by mouth at bedtime.         . senna (SENOKOT) 8.6 MG TABS   Oral   Take 2 tablets by mouth at bedtime.         . traMADol (ULTRAM) 50 MG tablet   Oral   Take 50 mg by mouth every 8 (eight) hours as needed for pain.         Marland Kitchen. acetaminophen (TYLENOL) 325 MG tablet   Oral   Take 325 mg by mouth every 6 (six) hours as needed. Take one or two tablets as needed for pain.         . cloNIDine (CATAPRES) 0.1 MG tablet   Oral   Take 0.1 mg by mouth as directed. Take one tablet by mouth every hour as needed for systolic blood pressure greater than 190 or diastolic blood pressure greater than 100. Not to exceed 0.6mg  in 24hrs.         . lubiprostone (AMITIZA) 24 MCG capsule   Oral   Take 1 capsule (24 mcg total) by mouth 2 (two) times daily with a meal.   30 capsule   0    BP 167/95  Pulse 56  Temp(Src) 97.9 F (36.6 C) (Oral)  Resp 17  SpO2 99% Physical Exam  Nursing note and vitals reviewed. Constitutional: He appears well-developed and well-nourished. No distress.  Awake, alert, nontoxic appearance  HENT:  Head: Normocephalic and atraumatic.  Mouth/Throat: Oropharynx is clear and moist. No oropharyngeal exudate.  Eyes: Conjunctivae are normal. No scleral icterus.  Neck: Normal range of motion. Neck supple.  Cardiovascular: Normal rate, regular rhythm, normal heart sounds and intact distal pulses.   No tachycardia  Pulmonary/Chest: Effort normal and breath sounds normal. No respiratory distress. He has no wheezes.  Abdominal: Soft. Bowel sounds are normal. He exhibits no distension and no mass.  There is no tenderness. There is no rebound and no guarding.  Genitourinary: Rectal exam shows no external hemorrhoid, no internal hemorrhoid, no fissure, no mass, no tenderness and anal tone normal.  Hard stool in the rectal vault; no disimpaction possible  Musculoskeletal: Normal range of motion. He exhibits no edema.  Lymphadenopathy:    He has no cervical adenopathy.  Neurological: He is alert. He exhibits normal muscle tone. Coordination normal.  Speech is clear and goal oriented Moves extremities without ataxia  Skin: Skin is warm and dry. He is not diaphoretic. No erythema.  Psychiatric: He has a normal mood and affect. His behavior is normal.    ED Course  Fecal disimpaction Date/Time: 09/14/2013 11:24 PM Performed by: Dierdre Forth Authorized by: Dierdre Forth Consent: Verbal consent obtained. Risks and benefits: risks, benefits and alternatives were discussed Consent given by: patient Patient understanding: patient states understanding of the procedure being performed Patient consent: the patient's understanding of the procedure matches consent given Procedure consent: procedure consent matches procedure scheduled Relevant documents: relevant documents present and verified Site marked: the operative site was marked Imaging studies: imaging studies available Required items: required blood products, implants, devices, and special equipment available Patient identity confirmed: verbally with patient and arm band Time out: Immediately prior to procedure a "time out" was called to verify the correct patient, procedure, equipment, support staff and site/side marked as required. Preparation: Patient was prepped and draped in the usual sterile fashion. Local anesthesia used: no Patient sedated: no Patient tolerance: Patient tolerated the procedure well with no immediate complications. Comments: Unable to remove stool, but no palpable impaction   (including critical  care time) Labs Review Labs Reviewed - No data to display Imaging Review Dg Abd Acute W/chest  09/14/2013   CLINICAL DATA:  Abdominal pain and distention; constipation. History of smoking.  EXAM: ACUTE ABDOMEN SERIES (ABDOMEN 2 VIEW & CHEST 1 VIEW)  COMPARISON:  Chest radiograph performed 02/01/2013  FINDINGS: The lungs are well-aerated. Mild vascular congestion is noted. There is no evidence of pleural effusion or pneumothorax. The cardiomediastinal silhouette is borderline normal in size.  The visualized bowel gas pattern is unremarkable. Scattered stool and air are seen within the colon; there is no evidence of small bowel dilatation to suggest obstruction. No free intra-abdominal air is identified on the provided upright view.  No acute osseous abnormalities are seen; the sacroiliac joints are unremarkable in appearance. Chronic right rib deformities are seen.  IMPRESSION: 1. Unremarkable bowel gas pattern; no free intra-abdominal air seen. Moderate stool burden noted. 2. Mild vascular congestion noted; lungs remain grossly clear.   Electronically Signed   By: Roanna Raider M.D.   On: 09/14/2013 23:09    EKG Interpretation   None       MDM   1. Constipation      [place] Nabor presents with history of intermittent constipation.  He reports no bowel movement in 2 months. Rectal exam with hard stool but no disimpaction possible.  He takes tramadol and regular basis. No history of abdominal surgery. We'll obtain acute abdominal series.  Acute series with moderate stool burden noted but no evidence of bowel obstruction or perforation.  Patient given lactulose and Dulcolax.  He has not had a bowel movement here in the emergency department but states he thinks he will be able to when he goes home.  Repeat abdominal exam reveals the abdomen remained soft and nontender without rigidity, peritoneal signs or rebound.    Patient to be discharged home with followup with primary care physician. Will  begin amitiza for constipation.  1:47 AM Pt now with large BM without difficulty.  Will discharge home.    It has  been determined that no acute conditions requiring further emergency intervention are present at this time. The patient/guardian have been advised of the diagnosis and plan. We have discussed signs and symptoms that warrant return to the ED, such as changes or worsening in symptoms.   Vital signs are stable at discharge.   BP 167/95  Pulse 56  Temp(Src) 97.9 F (36.6 C) (Oral)  Resp 17  SpO2 99%  Patient/guardian has voiced understanding and agreed to follow-up with the PCP or specialist.        Dierdre Forth, PA-C 09/15/13 0145  Dierdre Forth, PA-C 09/15/13 0148  Dahlia Client Kenasia Scheller, PA-C 09/15/13 1701

## 2013-09-15 MED ORDER — LUBIPROSTONE 24 MCG PO CAPS: 24.0000 ug | ORAL_CAPSULE | Freq: Two times a day (BID) | ORAL | Status: DC

## 2013-09-15 NOTE — ED Provider Notes (Signed)
Medical screening examination/treatment/procedure(s) were performed by non-physician practitioner and as supervising physician I was immediately available for consultation/collaboration.  EKG Interpretation   None        Sherly Brodbeck, MD 09/15/13 2335 

## 2013-09-15 NOTE — Discharge Instructions (Signed)
1. Medications: amitiza, usual home medications 2. Treatment: rest, drink plenty of fluids,  3. Follow Up: Please followup with your primary doctor for discussion of your diagnoses and further evaluation after today's visit; if you do not have a primary care doctor use the resource guide provided to find one;     Constipation, Adult Constipation is when a person has fewer than 3 bowel movements a week; has difficulty having a bowel movement; or has stools that are dry, hard, or larger than normal. As people grow older, constipation is more common. If you try to fix constipation with medicines that make you have a bowel movement (laxatives), the problem may get worse. Long-term laxative use may cause the muscles of the colon to become weak. A low-fiber diet, not taking in enough fluids, and taking certain medicines may make constipation worse. CAUSES   Certain medicines, such as antidepressants, pain medicine, iron supplements, antacids, and water pills.   Certain diseases, such as diabetes, irritable bowel syndrome (IBS), thyroid disease, or depression.   Not drinking enough water.   Not eating enough fiber-rich foods.   Stress or travel.  Lack of physical activity or exercise.  Not going to the restroom when there is the urge to have a bowel movement.  Ignoring the urge to have a bowel movement.  Using laxatives too much. SYMPTOMS   Having fewer than 3 bowel movements a week.   Straining to have a bowel movement.   Having hard, dry, or larger than normal stools.   Feeling full or bloated.   Pain in the lower abdomen.  Not feeling relief after having a bowel movement. DIAGNOSIS  Your caregiver will take a medical history and perform a physical exam. Further testing may be done for severe constipation. Some tests may include:   A barium enema X-ray to examine your rectum, colon, and sometimes, your small intestine.  A sigmoidoscopy to examine your lower colon.  A  colonoscopy to examine your entire colon. TREATMENT  Treatment will depend on the severity of your constipation and what is causing it. Some dietary treatments include drinking more fluids and eating more fiber-rich foods. Lifestyle treatments may include regular exercise. If these diet and lifestyle recommendations do not help, your caregiver may recommend taking over-the-counter laxative medicines to help you have bowel movements. Prescription medicines may be prescribed if over-the-counter medicines do not work.  HOME CARE INSTRUCTIONS   Increase dietary fiber in your diet, such as fruits, vegetables, whole grains, and beans. Limit high-fat and processed sugars in your diet, such as Jamaica[place] fries, hamburgers, cookies, candies, and soda.   A fiber supplement may be added to your diet if you cannot get enough fiber from foods.   Drink enough fluids to keep your urine clear or pale yellow.   Exercise regularly or as directed by your caregiver.   Go to the restroom when you have the urge to go. Do not hold it.  Only take medicines as directed by your caregiver. Do not take other medicines for constipation without talking to your caregiver first. SEEK IMMEDIATE MEDICAL CARE IF:   You have bright red blood in your stool.   Your constipation lasts for more than 4 days or gets worse.   You have abdominal or rectal pain.   You have thin, pencil-like stools.  You have unexplained weight loss. MAKE SURE YOU:   Understand these instructions.  Will watch your condition.  Will get help right away if you are not doing  well or get worse. Document Released: 05/14/2004 Document Revised: 11/08/2011 Document Reviewed: 05/28/2013 [facility] Patient Information 2014 [place], [place].

## 2015-01-30 ENCOUNTER — Emergency Department (HOSPITAL_COMMUNITY)
Admission: EM | Admit: 2015-01-30 | Discharge: 2015-01-30 | Disposition: A | Discharge status: Home / Self Care | Payer: Medicare Other | Attending: Emergency Medicine | Admitting: Emergency Medicine

## 2015-01-30 ENCOUNTER — Encounter (HOSPITAL_COMMUNITY): Payer: Self-pay | Admitting: Emergency Medicine

## 2015-01-30 DIAGNOSIS — Z8659 Personal history of other mental and behavioral disorders: Secondary | ICD-10-CM | POA: Insufficient documentation

## 2015-01-30 DIAGNOSIS — Z720 Tobacco use: Secondary | ICD-10-CM | POA: Diagnosis not present

## 2015-01-30 DIAGNOSIS — H578 Other specified disorders of eye and adnexa: Secondary | ICD-10-CM | POA: Diagnosis not present

## 2015-01-30 DIAGNOSIS — K5900 Constipation, unspecified: Secondary | ICD-10-CM | POA: Diagnosis not present

## 2015-01-30 DIAGNOSIS — H5789 Other specified disorders of eye and adnexa: Secondary | ICD-10-CM

## 2015-01-30 DIAGNOSIS — E119 Type 2 diabetes mellitus without complications: Secondary | ICD-10-CM | POA: Insufficient documentation

## 2015-01-30 DIAGNOSIS — H409 Unspecified glaucoma: Secondary | ICD-10-CM | POA: Diagnosis not present

## 2015-01-30 DIAGNOSIS — I2510 Atherosclerotic heart disease of native coronary artery without angina pectoris: Secondary | ICD-10-CM | POA: Insufficient documentation

## 2015-01-30 DIAGNOSIS — E559 Vitamin D deficiency, unspecified: Secondary | ICD-10-CM | POA: Insufficient documentation

## 2015-01-30 DIAGNOSIS — Z79899 Other long term (current) drug therapy: Secondary | ICD-10-CM | POA: Diagnosis not present

## 2015-01-30 DIAGNOSIS — K219 Gastro-esophageal reflux disease without esophagitis: Secondary | ICD-10-CM | POA: Diagnosis not present

## 2015-01-30 DIAGNOSIS — I10 Essential (primary) hypertension: Secondary | ICD-10-CM | POA: Diagnosis not present

## 2015-01-30 DIAGNOSIS — Z794 Long term (current) use of insulin: Secondary | ICD-10-CM | POA: Insufficient documentation

## 2015-01-30 MED ORDER — DEXAMETHASONE 4 MG PO TABS
12.0000 mg | ORAL_TABLET | Freq: Once | ORAL | Status: AC
  Administered 2015-01-30: 12 mg via ORAL
  Filled 2015-01-30: qty 3

## 2015-01-30 MED ORDER — TETRACAINE HCL 0.5 % OP SOLN
1.0000 [drp] | Freq: Once | OPHTHALMIC | Status: AC
  Administered 2015-01-30: 1 [drp] via OPHTHALMIC
  Filled 2015-01-30: qty 2

## 2015-01-30 MED ORDER — DIPHENHYDRAMINE HCL 25 MG PO CAPS
25.0000 mg | ORAL_CAPSULE | Freq: Once | ORAL | Status: AC
  Administered 2015-01-30: 25 mg via ORAL
  Filled 2015-01-30: qty 1

## 2015-01-30 MED ORDER — FLUORESCEIN SODIUM 1 MG OP STRP
1.0000 | ORAL_STRIP | Freq: Once | OPHTHALMIC | Status: AC
  Administered 2015-01-30: 1 via OPHTHALMIC
  Filled 2015-01-30: qty 1

## 2015-01-30 MED ORDER — TIMOLOL MALEATE 0.5 % OP SOLN: 1.0000 [drp] | Freq: Two times a day (BID) | OPHTHALMIC | Status: DC

## 2015-01-30 NOTE — ED Notes (Signed)
Called [facility] to give report on Pt's new prescription and discharge instructions, but Selena Batten, staff, was unavailable.  Awaiting a call back.

## 2015-01-30 NOTE — Discharge Instructions (Signed)
I suspect you may be having a hypersensitivity reaction to the alphagan eye drops you just began. Stop taking them. Begin taking prescribed eye drops. You need to follow-up with your eye doctor as soon as possible.

## 2015-01-30 NOTE — ED Notes (Signed)
Pt given ham sandwich and Diet Coke.    Called [facility] to get an ETA of when they will be here for pt.  Was told he is on the list and shouldn't be to much longer.

## 2015-01-30 NOTE — ED Notes (Signed)
Per EMS pt started on new eye medication, Brimonidine opth sol, last night 2000 resulting in right eye swelling. Upon assessment pupils equal and reactive but light sensitivity, swelling, redness noted to right eye.

## 2015-01-30 NOTE — ED Provider Notes (Signed)
CSN: [id number]     Arrival date & time 01/30/15  0907 History   First MD Initiated Contact with Patient 01/30/15 [phone]     Chief Complaint  Patient presents with  . Eye Problem     (Consider location/radiation/quality/duration/timing/severity/associated sxs/prior Treatment) HPI   69yM with R eye swelling, tearing, burning. Onset yesterday evening very shortly after starting alphagan. Just switched to this and has not previously taken. Tearing photophobia the right eye. No headaches. Denies any trauma. Does not wear contacts..  Past Medical History  Diagnosis Date  . Diabetes mellitus   . Hypertension   . GERD (gastroesophageal reflux disease)   . Vitamin D deficiency   . Constipation   . Schizophrenia   . Seizure   . Edema   . Glaucoma   . CAD (coronary artery disease)    History reviewed. No pertinent past surgical history. Family History  Problem Relation Age of Onset  . Hypertension Mother    History  Substance Use Topics  . Smoking status: Current Every Day Smoker  . Smokeless tobacco: Not on file  . Alcohol Use: No    Review of Systems  All systems reviewed and negative, other than as noted in HPI.   Allergies  Review of patient's allergies indicates no known allergies.  Home Medications   Prior to Admission medications   Medication Sig Start Date End Date Taking? Authorizing Provider  brimonidine (ALPHAGAN) 0.15 % ophthalmic solution Place 1 drop into both eyes 2 (two) times daily.   Yes Historical Provider, MD  docusate sodium (COLACE) 100 MG capsule Take 100 mg by mouth 2 (two) times daily.   Yes Historical Provider, MD  acetaminophen (TYLENOL) 325 MG tablet Take 325 mg by mouth every 6 (six) hours as needed. Take one or two tablets as needed for pain.    Historical Provider, MD  cholecalciferol (VITAMIN D) 1000 UNITS tablet Take 1,000 Units by mouth daily.    Historical Provider, MD  cloNIDine (CATAPRES) 0.1 MG tablet Take 0.1 mg by mouth as directed.  Take one tablet by mouth every hour as needed for systolic blood pressure greater than 190 or diastolic blood pressure greater than 100. Not to exceed 0.6mg  in 24hrs.    Historical Provider, MD  cycloSPORINE (RESTASIS) 0.05 % ophthalmic emulsion 1 drop 2 (two) times daily.    Historical Provider, MD  dorzolamide-timolol (COSOPT) 22.3-6.8 MG/ML ophthalmic solution Place 1 drop into both eyes 2 (two) times daily.    Historical Provider, MD  ferrous sulfate 325 (65 FE) MG tablet Take 325 mg by mouth daily with breakfast.    Historical Provider, MD  finasteride (PROSCAR) 5 MG tablet Take 5 mg by mouth daily.    Historical Provider, MD  furosemide (LASIX) 40 MG tablet Take 40 mg by mouth every morning.     Historical Provider, MD  insulin aspart (NOVOLOG FLEXPEN) 100 UNIT/ML FlexPen Inject 8 Units into the skin 3 (three) times daily with meals. Hold if bs is <80    Historical Provider, MD  insulin glargine (LANTUS) 100 UNIT/ML injection Inject 22 Units into the skin at bedtime.     Historical Provider, MD  isosorbide mononitrate (ISMO,MONOKET) 20 MG tablet Take 20 mg by mouth 2 (two) times daily.    Historical Provider, MD  latanoprost (XALATAN) 0.005 % ophthalmic solution Place 1 drop into both eyes at bedtime.    Historical Provider, MD  lisinopril (PRINIVIL,ZESTRIL) 5 MG tablet Take 1 tablet (5 mg total) by mouth daily.  02/05/13   Laveda Norman, MD  lubiprostone (AMITIZA) 24 MCG capsule Take 1 capsule (24 mcg total) by mouth 2 (two) times daily with a meal. 09/15/13   Hannah Muthersbaugh, PA-C  metoprolol tartrate (LOPRESSOR) 25 MG tablet Take 12.5 mg by mouth 2 (two) times daily.    Historical Provider, MD  omeprazole (PRILOSEC) 20 MG capsule Take 40 mg by mouth every evening. 30 min prior to evening meal.    Historical Provider, MD  risperiDONE (RISPERDAL) 2 MG tablet Take 2 mg by mouth at bedtime.    Historical Provider, MD  senna (SENOKOT) 8.6 MG TABS Take 2 tablets by mouth at bedtime.    Historical  Provider, MD  traMADol (ULTRAM) 50 MG tablet Take 50 mg by mouth every 8 (eight) hours as needed for pain.    Historical Provider, MD   BP 160/95 mmHg  Pulse 59  Temp(Src) 98 F (36.7 C) (Oral)  Resp 18  SpO2 96% Physical Exam  Constitutional: He appears well-developed and well-nourished. No distress.  HENT:  Head: Normocephalic and atraumatic.  Eyes: Conjunctivae are normal. Right eye exhibits no discharge. Left eye exhibits no discharge.  Lid edema. Chemosis. Diffuse conjunctival injection. Mild tearing. IOP OD: 22, 18. OS: 16. PERRL. Anterior chambers clear. No focal uptake of fluorescein. EOMI.   Neck: Neck supple.  Cardiovascular: Normal rate, regular rhythm and normal heart sounds.  Exam reveals no gallop and no friction rub.   No murmur heard. Pulmonary/Chest: Effort normal and breath sounds normal. No respiratory distress.  Abdominal: Soft. He exhibits no distension. There is no tenderness.  Musculoskeletal: He exhibits no edema or tenderness.  Neurological: He is alert.  Skin: Skin is warm and dry.  Psychiatric: He has a normal mood and affect. His behavior is normal. Thought content normal.  Nursing note and vitals reviewed.   ED Course  Procedures (including critical care time) Labs Review Labs Reviewed - No data to display  Imaging Review No results found.   EKG Interpretation None      MDM   Final diagnoses:  Swelling of right eye    69yM with conjunctival injection, tearing and lid edema. Suspect hypersensitivity reaction to alphagan. Symptoms began very shortly after initiating therapy. Instructed to stop. Change to timolol until can be seen by PCP.     Raeford Razor, MD 02/08/15 [phone]

## 2015-01-30 NOTE — ED Notes (Signed)
Bed: SC38 Expected date:  Expected time:  Means of arrival:  Comments: EMS- 69yo M, eye swelling, new eye medication

## 2015-02-09 ENCOUNTER — Emergency Department (HOSPITAL_COMMUNITY)
Admission: EM | Admit: 2015-02-09 | Discharge: 2015-02-09 | Disposition: A | Discharge status: Home / Self Care | Payer: Medicare Other | Attending: Emergency Medicine | Admitting: Emergency Medicine

## 2015-02-09 ENCOUNTER — Encounter (HOSPITAL_COMMUNITY): Payer: Self-pay | Admitting: *Deleted

## 2015-02-09 DIAGNOSIS — I10 Essential (primary) hypertension: Secondary | ICD-10-CM | POA: Diagnosis not present

## 2015-02-09 DIAGNOSIS — Z794 Long term (current) use of insulin: Secondary | ICD-10-CM | POA: Insufficient documentation

## 2015-02-09 DIAGNOSIS — H5789 Other specified disorders of eye and adnexa: Secondary | ICD-10-CM

## 2015-02-09 DIAGNOSIS — E119 Type 2 diabetes mellitus without complications: Secondary | ICD-10-CM | POA: Diagnosis not present

## 2015-02-09 DIAGNOSIS — K219 Gastro-esophageal reflux disease without esophagitis: Secondary | ICD-10-CM | POA: Insufficient documentation

## 2015-02-09 DIAGNOSIS — H409 Unspecified glaucoma: Secondary | ICD-10-CM | POA: Insufficient documentation

## 2015-02-09 DIAGNOSIS — K5900 Constipation, unspecified: Secondary | ICD-10-CM | POA: Diagnosis not present

## 2015-02-09 DIAGNOSIS — G40909 Epilepsy, unspecified, not intractable, without status epilepticus: Secondary | ICD-10-CM | POA: Diagnosis not present

## 2015-02-09 DIAGNOSIS — F209 Schizophrenia, unspecified: Secondary | ICD-10-CM | POA: Insufficient documentation

## 2015-02-09 DIAGNOSIS — H578 Other specified disorders of eye and adnexa: Secondary | ICD-10-CM | POA: Diagnosis present

## 2015-02-09 DIAGNOSIS — E559 Vitamin D deficiency, unspecified: Secondary | ICD-10-CM | POA: Insufficient documentation

## 2015-02-09 DIAGNOSIS — I2510 Atherosclerotic heart disease of native coronary artery without angina pectoris: Secondary | ICD-10-CM | POA: Insufficient documentation

## 2015-02-09 DIAGNOSIS — H01003 Unspecified blepharitis right eye, unspecified eyelid: Secondary | ICD-10-CM | POA: Insufficient documentation

## 2015-02-09 DIAGNOSIS — Z79899 Other long term (current) drug therapy: Secondary | ICD-10-CM | POA: Diagnosis not present

## 2015-02-09 DIAGNOSIS — H01006 Unspecified blepharitis left eye, unspecified eyelid: Secondary | ICD-10-CM | POA: Insufficient documentation

## 2015-02-09 DIAGNOSIS — Z792 Long term (current) use of antibiotics: Secondary | ICD-10-CM | POA: Diagnosis not present

## 2015-02-09 MED ORDER — CIPROFLOXACIN HCL 0.3 % OP OINT: TOPICAL_OINTMENT | OPHTHALMIC | Status: DC

## 2015-02-09 MED ORDER — FLUORESCEIN SODIUM 1 MG OP STRP
1.0000 | ORAL_STRIP | Freq: Once | OPHTHALMIC | Status: AC
  Administered 2015-02-09: 1 via OPHTHALMIC
  Filled 2015-02-09: qty 1

## 2015-02-09 MED ORDER — TETRACAINE HCL 0.5 % OP SOLN
2.0000 [drp] | Freq: Once | OPHTHALMIC | Status: AC
  Administered 2015-02-09: 2 [drp] via OPHTHALMIC
  Filled 2015-02-09: qty 2

## 2015-02-09 NOTE — ED Provider Notes (Signed)
CSN: [id number]     Arrival date & time 02/09/15  0859 History   First MD Initiated Contact with Patient 02/09/15 [phone]     Chief Complaint  Patient presents with  . Burning Eyes     (Consider location/radiation/quality/duration/timing/severity/associated sxs/prior Treatment) HPI  Pt is a 69yo male with hx of DM, HTN, GERD, schizophrenia, seizures, edema, CAD, and glaucoma, sent to ED from [facility] of [place] for further evaluation of pt's c/o bilateral eye redness, swelling and burning.  Symptoms believed to be due to his eye drops.  Pt was seen on 01/30/15 for same. At that time, pt had immediate reaction to alphagan eye drops. Was advised to stop that medication and start timolol.  Pt states he has changed medications but no change in symptoms. States he has not f/u with eye doctor as the facility has not scheduled him an appointment yet. Denies wearing contacts but he does wear glasses for distance.  Pt denies change in vision.     Past Medical History  Diagnosis Date  . Diabetes mellitus   . Hypertension   . GERD (gastroesophageal reflux disease)   . Vitamin D deficiency   . Constipation   . Schizophrenia   . Seizure   . Edema   . Glaucoma   . CAD (coronary artery disease)    History reviewed. No pertinent past surgical history. Family History  Problem Relation Age of Onset  . Hypertension Mother    History  Substance Use Topics  . Smoking status: Current Every Day Smoker  . Smokeless tobacco: Not on file  . Alcohol Use: No    Review of Systems  Constitutional: Negative for fever and chills.  HENT: Negative for congestion, ear pain, sore throat, trouble swallowing and voice change.   Eyes: Positive for photophobia, pain, redness and itching. Negative for discharge and visual disturbance.  Respiratory: Negative for cough.   All other systems reviewed and are negative.     Allergies  Review of patient's allergies indicates no known  allergies.  Home Medications   Prior to Admission medications   Medication Sig Start Date End Date Taking? Authorizing Provider  acetaminophen (TYLENOL) 325 MG tablet Take 325-650 mg by mouth every 6 (six) hours as needed for mild pain or moderate pain.    Yes Historical Provider, MD  cholecalciferol (VITAMIN D) 1000 UNITS tablet Take 1,000 Units by mouth every morning.    Yes Historical Provider, MD  cloNIDine (CATAPRES) 0.1 MG tablet Take 0.1 mg by mouth as directed. Take one tablet by mouth every hour as needed for systolic blood pressure greater than 190 or diastolic blood pressure greater than 100. Not to exceed 0.6mg  in 24hrs.   Yes Historical Provider, MD  cycloSPORINE (RESTASIS) 0.05 % ophthalmic emulsion Place 1 drop into both eyes 2 (two) times daily.    Yes Historical Provider, MD  docusate sodium (COLACE) 100 MG capsule Take 100 mg by mouth 2 (two) times daily.   Yes Historical Provider, MD  dorzolamide-timolol (COSOPT) 22.3-6.8 MG/ML ophthalmic solution Place 1 drop into both eyes 2 (two) times daily. WAIT AT LEAST 5 MINUTES IN BETWEEN DROPS   Yes Historical Provider, MD  ferrous sulfate 325 (65 FE) MG tablet Take 325 mg by mouth daily with breakfast.   Yes Historical Provider, MD  finasteride (PROSCAR) 5 MG tablet Take 5 mg by mouth every evening.    Yes Historical Provider, MD  furosemide (LASIX) 40 MG tablet Take 40 mg by mouth  every morning.    Yes Historical Provider, MD  insulin aspart (NOVOLOG FLEXPEN) 100 UNIT/ML FlexPen Inject 10 Units into the skin 3 (three) times daily with meals. Hold if bs is <80   Yes Historical Provider, MD  insulin glargine (LANTUS) 100 UNIT/ML injection Inject 28 Units into the skin at bedtime. Hold is FSBS is less than 75   Yes Historical Provider, MD  isosorbide mononitrate (ISMO,MONOKET) 20 MG tablet Take 20 mg by mouth 2 (two) times daily.   Yes Historical Provider, MD  latanoprost (XALATAN) 0.005 % ophthalmic solution Place 1 drop into both eyes  at bedtime.   Yes Historical Provider, MD  Linaclotide (LINZESS) 145 MCG CAPS capsule Take 145 mcg by mouth every morning.    Yes Historical Provider, MD  lisinopril (PRINIVIL,ZESTRIL) 10 MG tablet Take 10 mg by mouth every morning.    Yes Historical Provider, MD  meloxicam (MOBIC) 15 MG tablet Take 15 mg by mouth daily as needed for pain.   Yes Historical Provider, MD  metoprolol tartrate (LOPRESSOR) 25 MG tablet Take 12.5 mg by mouth 2 (two) times daily.   Yes Historical Provider, MD  Nutritional Supplements (NUTRITIONAL DRINK PLUS) LIQD Take 90 mLs by mouth 3 (three) times daily.   Yes Historical Provider, MD  omeprazole (PRILOSEC) 20 MG capsule Take 40 mg by mouth every evening. 30 min prior to evening meal.   Yes Historical Provider, MD  risperiDONE (RISPERDAL) 2 MG tablet Take 2 mg by mouth at bedtime.   Yes Historical Provider, MD  senna (SENOKOT) 8.6 MG TABS Take 2 tablets by mouth at bedtime.   Yes Historical Provider, MD  timolol (TIMOPTIC) 0.5 % ophthalmic solution Place 1 drop into both eyes every 12 (twelve) hours. 01/30/15  Yes Raeford Razor, MD  traMADol (ULTRAM) 50 MG tablet Take 50 mg by mouth every 8 (eight) hours as needed for pain.   Yes Historical Provider, MD  ciprofloxacin (CILOXAN) 0.3 % ophthalmic ointment Use 1 drop in each eye every 4 hours while awake for 5 days. 02/09/15   Junius Finner, PA-C  lisinopril (PRINIVIL,ZESTRIL) 5 MG tablet Take 1 tablet (5 mg total) by mouth daily. Patient not taking: Reported on 01/30/2015 02/05/13   Laveda Norman, MD  lubiprostone (AMITIZA) 24 MCG capsule Take 1 capsule (24 mcg total) by mouth 2 (two) times daily with a meal. Patient not taking: Reported on 01/30/2015 09/15/13   Dahlia Client Muthersbaugh, PA-C   BP 149/80 mmHg  Pulse 95  Temp(Src) 98.4 F (36.9 C) (Oral)  SpO2 98% Physical Exam  Constitutional: He is oriented to person, place, and time. He appears well-developed and well-nourished.  HENT:  Head: Normocephalic and atraumatic.  Nose:  Nose normal.  Mouth/Throat: Uvula is midline, oropharynx is clear and moist and mucous membranes are normal.  Bilateral lid edema with chemosis and diffuse injection of conjunctiva   Eyes: EOM are normal. Pupils are equal, round, and reactive to light. Lids are everted and swept, no foreign bodies found. Right eye exhibits chemosis. Left eye exhibits chemosis. Right conjunctiva is injected. Right conjunctiva has no hemorrhage. Left conjunctiva is injected. Left conjunctiva has no hemorrhage.  Slit lamp exam:      The right eye shows no corneal abrasion, no corneal flare, no corneal ulcer and no fluorescein uptake.       The left eye shows no corneal abrasion, no corneal flare, no corneal ulcer and no fluorescein uptake.  Edema and erythema to bilateral eyelids.  PERRL, EOM in  tact.  Visual acuity with corrective glasses:     Bilateral near: 20/400     Left near: 20/400     Right near: 20/400 IOP: 23 in Left and Right eye.   Neck: Normal range of motion. Neck supple.  Cardiovascular: Normal rate.   Pulmonary/Chest: Effort normal.  Musculoskeletal: Normal range of motion.  Neurological: He is alert and oriented to person, place, and time.  Skin: Skin is warm and dry.  Psychiatric: He has a normal mood and affect. His behavior is normal.  Nursing note and vitals reviewed.   ED Course  Procedures (including critical care time) Labs Review Labs Reviewed - No data to display  Imaging Review No results found.   EKG Interpretation None      MDM   Final diagnoses:  Irritation of both eyes  Blepharitis of both eyes    Pt is a 69yo male with hx of glaucoma sent to ED from [facility] [place] for bilateral eye pain, swelling and redness.  Pt was seen on 01/30/15 for same. Pt was taken off alphagan and started timolol.  Pt presenting today with no improvement of symptoms.  Discussed pt with Dr. Ethelda Chick who also examined pt.  Will have pt continue eye  medications as prescribed as pt does have hx of glaucoma.  Irritation appears more due to eyelids than eyes themselves. Will tx with ciprofloxacin ophthalmic drops.  Consulted with facility as ophthalmology, Dr. Mitzi Davenport could not be reached today as it is a Sunday.  Strongly encouraged facility to call tomorrow to schedule f/u appointment with ophthalmology.  Staff agreed to make note to call tomorrow.  Home care instructions provided. Return precautions provided. Pt verbalized understanding and agreement with tx plan.    Junius Finner, PA-C 02/09/15 1105  Doug Sou, MD 02/09/15 [phone]

## 2015-02-09 NOTE — ED Notes (Signed)
Bed: HW38 Expected date:  Expected time:  Means of arrival:  Comments: Ems-RED EYES

## 2015-02-09 NOTE — ED Provider Notes (Signed)
[place] of bilateral eyes "burning" for one week. Denies visual change. Brought by EMS. No treatment prior to coming here. On exam alert nontoxic bilateral upper and lower eyelids slightly swollen and reddened. Sclera injected bilaterally. No pain on extraocular movement.  Doug Sou, MD 02/09/15 [phone]

## 2015-02-09 NOTE — Discharge Instructions (Signed)
Glaucoma Glaucoma happens when the fluid pressure in the eyeball is too high. The pressure cannot stay high for too long, or the eyeball may become damaged. Signs of glaucoma include:  Having a hard time seeing in a dark room after being in a bright one.  Having trouble seeing things out to the sides of you.  Blurry sight.  Seeing bright white lights or colors in front of your eyes.  Headaches.  Feeling sick to your stomach (nauseous) or throwing up (vomiting).  Sudden vision loss. Glaucoma testing is an important part of taking care of your eyesight. HOME CARE  Always use your eyedrops or pills as told by your doctor. Do not run out.  Do not go away from home without your eyedrops or pills.  Keep your appointments.  Always tell a new doctor that you have glaucoma and how long you have had it. Tell the doctor about the eyedrops and pills you take.  Do not use eyedrops or pills that have not been prescribed by your doctor. GET HELP RIGHT AWAY IF:  You develop severe pain in the affected eye.  You develop vision problems.  You develop a bad headache in the area around the eye.  You feel sick to your stomach or throw up.  You start to have problems with the other eye. MAKE SURE YOU:   Understand these instructions.  Will watch your condition.  Will get help right away if you are not doing well or get worse. Document Released: 05/25/2008 Document Revised: 11/08/2011 Document Reviewed: 05/25/2008 [facility] Patient Information 2015 [place], [place]. This information is not intended to replace advice given to you by your health care provider. Make sure you discuss any questions you have with your health care provider.

## 2015-02-09 NOTE — ED Notes (Signed)
Report called to [facility] [place] (now called [facility]) Spoke with Selena Batten

## 2015-02-09 NOTE — ED Notes (Signed)
Pt arrived via EMS from [facility] [place] [phone] C/o redness, swelling and burning in both eyes ? Reaction to eye drops, denies other symptoms

## 2015-09-30 ENCOUNTER — Emergency Department (HOSPITAL_COMMUNITY): Payer: Medicare Other

## 2015-09-30 ENCOUNTER — Encounter (HOSPITAL_COMMUNITY): Payer: Self-pay | Admitting: Internal Medicine

## 2015-09-30 ENCOUNTER — Inpatient Hospital Stay (HOSPITAL_COMMUNITY)
Admission: EM | Admit: 2015-09-30 | Discharge: 2015-10-07 | DRG: 308 | Disposition: A | Discharge status: Nursing Home (Includes ICF) | Payer: Medicare Other | Attending: Internal Medicine | Admitting: Internal Medicine

## 2015-09-30 DIAGNOSIS — I4891 Unspecified atrial fibrillation: Principal | ICD-10-CM | POA: Insufficient documentation

## 2015-09-30 DIAGNOSIS — F17200 Nicotine dependence, unspecified, uncomplicated: Secondary | ICD-10-CM | POA: Diagnosis present

## 2015-09-30 DIAGNOSIS — I429 Cardiomyopathy, unspecified: Secondary | ICD-10-CM | POA: Diagnosis present

## 2015-09-30 DIAGNOSIS — H409 Unspecified glaucoma: Secondary | ICD-10-CM | POA: Diagnosis present

## 2015-09-30 DIAGNOSIS — I5021 Acute systolic (congestive) heart failure: Secondary | ICD-10-CM | POA: Diagnosis present

## 2015-09-30 DIAGNOSIS — R0600 Dyspnea, unspecified: Secondary | ICD-10-CM

## 2015-09-30 DIAGNOSIS — I110 Hypertensive heart disease with heart failure: Secondary | ICD-10-CM | POA: Diagnosis present

## 2015-09-30 DIAGNOSIS — F209 Schizophrenia, unspecified: Secondary | ICD-10-CM | POA: Diagnosis present

## 2015-09-30 DIAGNOSIS — I4729 Other ventricular tachycardia: Secondary | ICD-10-CM

## 2015-09-30 DIAGNOSIS — E1121 Type 2 diabetes mellitus with diabetic nephropathy: Secondary | ICD-10-CM

## 2015-09-30 DIAGNOSIS — K219 Gastro-esophageal reflux disease without esophagitis: Secondary | ICD-10-CM | POA: Diagnosis present

## 2015-09-30 DIAGNOSIS — E11649 Type 2 diabetes mellitus with hypoglycemia without coma: Secondary | ICD-10-CM | POA: Diagnosis not present

## 2015-09-30 DIAGNOSIS — F79 Unspecified intellectual disabilities: Secondary | ICD-10-CM | POA: Diagnosis present

## 2015-09-30 DIAGNOSIS — N400 Enlarged prostate without lower urinary tract symptoms: Secondary | ICD-10-CM | POA: Diagnosis present

## 2015-09-30 DIAGNOSIS — R079 Chest pain, unspecified: Secondary | ICD-10-CM | POA: Diagnosis present

## 2015-09-30 DIAGNOSIS — E119 Type 2 diabetes mellitus without complications: Secondary | ICD-10-CM

## 2015-09-30 DIAGNOSIS — Z79899 Other long term (current) drug therapy: Secondary | ICD-10-CM | POA: Diagnosis not present

## 2015-09-30 DIAGNOSIS — Z794 Long term (current) use of insulin: Secondary | ICD-10-CM

## 2015-09-30 DIAGNOSIS — I472 Ventricular tachycardia: Secondary | ICD-10-CM | POA: Diagnosis present

## 2015-09-30 DIAGNOSIS — I10 Essential (primary) hypertension: Secondary | ICD-10-CM | POA: Diagnosis present

## 2015-09-30 DIAGNOSIS — I2510 Atherosclerotic heart disease of native coronary artery without angina pectoris: Secondary | ICD-10-CM | POA: Diagnosis present

## 2015-09-30 HISTORY — DX: Unspecified atrial fibrillation: I48.91

## 2015-09-30 HISTORY — DX: Unspecified intellectual disabilities: F79

## 2015-09-30 HISTORY — DX: Type 2 diabetes mellitus without complications: E11.9

## 2015-09-30 HISTORY — DX: Benign prostatic hyperplasia without lower urinary tract symptoms: N40.0

## 2015-09-30 LAB — BASIC METABOLIC PANEL
ANION GAP: 13 (ref 5–15)
BUN: 9 mg/dL (ref 6–20)
CHLORIDE: 106 mmol/L (ref 101–111)
CO2: 26 mmol/L (ref 22–32)
CREATININE: 1.05 mg/dL (ref 0.61–1.24)
Calcium: 9.2 mg/dL (ref 8.9–10.3)
GFR calc non Af Amer: >60 mL/min (ref >60)
Glucose, Bld: 83 mg/dL (ref 65–99)
Potassium: 3.8 mmol/L (ref 3.5–5.1)
Sodium: 145 mmol/L (ref 135–145)

## 2015-09-30 LAB — TSH: TSH: 2.092 u[IU]/mL (ref 0.350–4.500)

## 2015-09-30 LAB — CBC
HCT: 43.9 % (ref 39.0–52.0)
HEMOGLOBIN: 14.8 g/dL (ref 13.0–17.0)
MCH: 31.5 pg (ref 26.0–34.0)
MCHC: 33.7 g/dL (ref 30.0–36.0)
MCV: 93.4 fL (ref 78.0–100.0)
Platelets: 221 10*3/uL (ref 150–400)
RBC: 4.7 MIL/uL (ref 4.22–5.81)
RDW: 14.5 % (ref 11.5–15.5)
WBC: 7.2 10*3/uL (ref 4.0–10.5)

## 2015-09-30 LAB — I-STAT TROPONIN, ED: Troponin i, poc: 0.02 ng/mL (ref 0.00–0.08)

## 2015-09-30 LAB — PHOSPHORUS: PHOSPHORUS: 3.5 mg/dL (ref 2.5–4.6)

## 2015-09-30 LAB — BRAIN NATRIURETIC PEPTIDE: B NATRIURETIC PEPTIDE 5: 201.2 pg/mL — AB (ref 0.0–100.0)

## 2015-09-30 LAB — MAGNESIUM: Magnesium: 2 mg/dL (ref 1.7–2.4)

## 2015-09-30 MED ORDER — DEXTROSE 5 % IV SOLN
5.0000 mg/h | INTRAVENOUS | Status: DC
  Administered 2015-09-30: 5 mg/h via INTRAVENOUS
  Filled 2015-09-30: qty 100

## 2015-09-30 MED ORDER — DILTIAZEM HCL 100 MG IV SOLR
5.0000 mg/h | INTRAVENOUS | Status: DC
  Administered 2015-10-01: 10 mg/h via INTRAVENOUS
  Administered 2015-10-01: 15 mg/h via INTRAVENOUS
  Filled 2015-09-30 (×2): qty 100

## 2015-09-30 MED ORDER — INSULIN ASPART 100 UNIT/ML ~~LOC~~ SOLN
0.0000 [IU] | Freq: Three times a day (TID) | SUBCUTANEOUS | Status: DC
  Administered 2015-10-03: 1 [IU] via SUBCUTANEOUS
  Administered 2015-10-04: 2 [IU] via SUBCUTANEOUS
  Administered 2015-10-05 (×2): 1 [IU] via SUBCUTANEOUS
  Administered 2015-10-06: 2 [IU] via SUBCUTANEOUS
  Administered 2015-10-07: 1 [IU] via SUBCUTANEOUS

## 2015-09-30 MED ORDER — FERROUS SULFATE 325 (65 FE) MG PO TABS
325.0000 mg | ORAL_TABLET | Freq: Every day | ORAL | Status: DC
  Administered 2015-10-01 – 2015-10-07 (×7): 325 mg via ORAL
  Filled 2015-09-30 (×9): qty 1

## 2015-09-30 MED ORDER — ONDANSETRON HCL 4 MG PO TABS: 4.0000 mg | ORAL_TABLET | Freq: Four times a day (QID) | ORAL | Status: DC | PRN

## 2015-09-30 MED ORDER — SENNA 8.6 MG PO TABS
2.0000 | ORAL_TABLET | Freq: Every day | ORAL | Status: DC
  Administered 2015-10-01 – 2015-10-06 (×7): 17.2 mg via ORAL
  Filled 2015-09-30 (×7): qty 2

## 2015-09-30 MED ORDER — HEPARIN BOLUS VIA INFUSION
4000.0000 [IU] | Freq: Once | INTRAVENOUS | Status: AC
  Administered 2015-09-30: 4000 [IU] via INTRAVENOUS
  Filled 2015-09-30: qty 4000

## 2015-09-30 MED ORDER — LISINOPRIL 10 MG PO TABS
10.0000 mg | ORAL_TABLET | Freq: Every morning | ORAL | Status: DC
  Administered 2015-10-01 – 2015-10-06 (×6): 10 mg via ORAL
  Filled 2015-09-30 (×6): qty 1

## 2015-09-30 MED ORDER — ACETAMINOPHEN 325 MG PO TABS: 650.0000 mg | ORAL_TABLET | Freq: Four times a day (QID) | ORAL | Status: DC | PRN

## 2015-09-30 MED ORDER — INSULIN GLARGINE 100 UNIT/ML ~~LOC~~ SOLN
28.0000 [IU] | Freq: Every day | SUBCUTANEOUS | Status: DC
  Administered 2015-09-30 – 2015-10-01 (×2): 28 [IU] via SUBCUTANEOUS
  Filled 2015-09-30 (×3): qty 0.28

## 2015-09-30 MED ORDER — FUROSEMIDE 40 MG PO TABS
40.0000 mg | ORAL_TABLET | Freq: Every morning | ORAL | Status: DC
  Administered 2015-10-01 – 2015-10-02 (×2): 40 mg via ORAL
  Filled 2015-09-30 (×2): qty 1

## 2015-09-30 MED ORDER — VITAMIN D 1000 UNITS PO TABS
1000.0000 [IU] | ORAL_TABLET | Freq: Every morning | ORAL | Status: DC
  Administered 2015-10-01 – 2015-10-07 (×7): 1000 [IU] via ORAL
  Filled 2015-09-30 (×7): qty 1

## 2015-09-30 MED ORDER — CYCLOSPORINE 0.05 % OP EMUL
1.0000 [drp] | Freq: Two times a day (BID) | OPHTHALMIC | Status: DC
  Administered 2015-10-01 – 2015-10-07 (×14): 1 [drp] via OPHTHALMIC
  Filled 2015-09-30 (×15): qty 1

## 2015-09-30 MED ORDER — HEPARIN (PORCINE) IN NACL 100-0.45 UNIT/ML-% IJ SOLN
1300.0000 [IU]/h | INTRAMUSCULAR | Status: DC
  Administered 2015-09-30: 1450 [IU]/h via INTRAVENOUS
  Administered 2015-10-01: 1300 [IU]/h via INTRAVENOUS
  Filled 2015-09-30 (×2): qty 250

## 2015-09-30 MED ORDER — METOPROLOL TARTRATE 12.5 MG HALF TABLET
12.5000 mg | ORAL_TABLET | Freq: Two times a day (BID) | ORAL | Status: DC
  Administered 2015-10-01 – 2015-10-02 (×4): 12.5 mg via ORAL
  Filled 2015-09-30 (×4): qty 1

## 2015-09-30 MED ORDER — INSULIN ASPART 100 UNIT/ML ~~LOC~~ SOLN
10.0000 [IU] | Freq: Three times a day (TID) | SUBCUTANEOUS | Status: DC
  Administered 2015-10-01: 10 [IU] via SUBCUTANEOUS

## 2015-09-30 MED ORDER — RISPERIDONE 2 MG PO TABS
2.0000 mg | ORAL_TABLET | Freq: Every day | ORAL | Status: DC
  Administered 2015-10-01 – 2015-10-06 (×7): 2 mg via ORAL
  Filled 2015-09-30 (×8): qty 1

## 2015-09-30 MED ORDER — LATANOPROST 0.005 % OP SOLN
1.0000 [drp] | Freq: Every day | OPHTHALMIC | Status: DC
Start: 2015-09-30 — End: 2015-10-07
  Administered 2015-10-01 – 2015-10-06 (×7): 1 [drp] via OPHTHALMIC
  Filled 2015-09-30: qty 2.5

## 2015-09-30 MED ORDER — ONDANSETRON HCL 4 MG/2ML IJ SOLN: 4.0000 mg | Freq: Four times a day (QID) | INTRAMUSCULAR | Status: DC | PRN

## 2015-09-30 MED ORDER — TRAMADOL HCL 50 MG PO TABS: 50.0000 mg | ORAL_TABLET | Freq: Three times a day (TID) | ORAL | Status: DC | PRN

## 2015-09-30 MED ORDER — NUTRITIONAL DRINK PLUS PO LIQD: 90.0000 mL | Freq: Three times a day (TID) | ORAL | Status: DC

## 2015-09-30 MED ORDER — FINASTERIDE 5 MG PO TABS
5.0000 mg | ORAL_TABLET | Freq: Every evening | ORAL | Status: DC
  Administered 2015-10-01 – 2015-10-06 (×6): 5 mg via ORAL
  Filled 2015-09-30 (×7): qty 1

## 2015-09-30 MED ORDER — DILTIAZEM LOAD VIA INFUSION
10.0000 mg | Freq: Once | INTRAVENOUS | Status: AC
Start: 2015-09-30 — End: 2015-09-30
  Administered 2015-09-30: 10 mg via INTRAVENOUS
  Filled 2015-09-30: qty 10

## 2015-09-30 MED ORDER — INSULIN ASPART 100 UNIT/ML FLEXPEN: 10.0000 [IU] | PEN_INJECTOR | Freq: Three times a day (TID) | SUBCUTANEOUS | Status: DC

## 2015-09-30 MED ORDER — ISOSORBIDE MONONITRATE 20 MG PO TABS
20.0000 mg | ORAL_TABLET | Freq: Two times a day (BID) | ORAL | Status: DC
  Administered 2015-10-01 – 2015-10-07 (×14): 20 mg via ORAL
  Filled 2015-09-30 (×16): qty 1

## 2015-09-30 MED ORDER — BRIMONIDINE TARTRATE 0.15 % OP SOLN
1.0000 [drp] | Freq: Two times a day (BID) | OPHTHALMIC | Status: DC
  Administered 2015-10-01 – 2015-10-07 (×14): 1 [drp] via OPHTHALMIC
  Filled 2015-09-30: qty 5

## 2015-09-30 MED ORDER — DOCUSATE SODIUM 100 MG PO CAPS
100.0000 mg | ORAL_CAPSULE | Freq: Two times a day (BID) | ORAL | Status: DC
  Administered 2015-10-01 – 2015-10-07 (×12): 100 mg via ORAL
  Filled 2015-09-30 (×13): qty 1

## 2015-09-30 MED ORDER — ACETAMINOPHEN 650 MG RE SUPP: 650.0000 mg | Freq: Four times a day (QID) | RECTAL | Status: DC | PRN

## 2015-09-30 MED ORDER — LINACLOTIDE 145 MCG PO CAPS
145.0000 ug | ORAL_CAPSULE | Freq: Every morning | ORAL | Status: DC
  Administered 2015-10-01 – 2015-10-07 (×7): 145 ug via ORAL
  Filled 2015-09-30 (×7): qty 1

## 2015-09-30 MED ORDER — PANTOPRAZOLE SODIUM 40 MG PO TBEC
40.0000 mg | DELAYED_RELEASE_TABLET | Freq: Every day | ORAL | Status: DC
  Administered 2015-10-01 – 2015-10-07 (×7): 40 mg via ORAL
  Filled 2015-09-30 (×7): qty 1

## 2015-09-30 MED ORDER — ASPIRIN 81 MG PO CHEW: 324.0000 mg | CHEWABLE_TABLET | Freq: Once | ORAL | Status: DC

## 2015-09-30 NOTE — H&P (Signed)
[facility] History and Physical  Jordan Rowe ZOX:[id number] DOB: 07/02/1946 DOA: 09/30/2015  Referring physician: Dr. Rennis Chris. PCP: PROVIDER NOT IN SYSTEM  Specialists: None.  Chief Complaint: Chest pain.  HPI: Jordan Rowe is a 70 y.o. male with history of schizophrenia and mental retarded patient, diabetes mellitus and hypertension was brought from the nursing home after patient was complaining of chest pain. Patient states he has been having chest pain off and on since yesterday. Patient is not exactly able to characterize the chest pain. Patient states the chest pain was in the anterior chest wall. Sometimes associated with shortness of breath. Denies any palpitations. In the ER patient was found to be in A. fib with RVR which is new diagnosis for the patient. Chest x-ray shows congestion. Patient has been started on Cardizem infusion along with heparin and admitted for A. fib with RVR. Patient is presently chest pain-free. Has had one episode of nausea vomiting but abdomen is benign.   Review of Systems: As presented in the history of presenting illness, rest negative.  Past Medical History  Diagnosis Date  . Diabetes mellitus   . Hypertension   . GERD (gastroesophageal reflux disease)   . Vitamin D deficiency   . Constipation   . Schizophrenia (HCC)   . Seizure (HCC)   . Edema   . Glaucoma   . CAD (coronary artery disease)    History reviewed. No pertinent past surgical history. Social History:  reports that he has been smoking.  He does not have any smokeless tobacco history on file. He reports that he does not drink alcohol or use illicit drugs. Where does patient live nursing home. Can patient participate in ADLs? No.  No Known Allergies  Family History:  Family History  Problem Relation Age of Onset  . Hypertension Mother       Prior to Admission medications   Medication Sig Start Date End Date Taking? Authorizing Provider  acetaminophen (TYLENOL)  325 MG tablet Take 325-650 mg by mouth every 6 (six) hours as needed for mild pain or moderate pain.    Yes Historical Provider, MD  brimonidine (ALPHAGAN P) 0.1 % SOLN Place 1 drop into both eyes 2 (two) times daily.   Yes Historical Provider, MD  cholecalciferol (VITAMIN D) 1000 UNITS tablet Take 1,000 Units by mouth every morning.    Yes Historical Provider, MD  cloNIDine (CATAPRES) 0.1 MG tablet Take 0.1 mg by mouth as directed. Take one tablet by mouth every hour as needed for systolic blood pressure greater than 190 or diastolic blood pressure greater than 100. Not to exceed 0.6mg  in 24hrs.   Yes Historical Provider, MD  cycloSPORINE (RESTASIS) 0.05 % ophthalmic emulsion Place 1 drop into both eyes 2 (two) times daily.    Yes Historical Provider, MD  docusate sodium (COLACE) 100 MG capsule Take 100 mg by mouth 2 (two) times daily.   Yes Historical Provider, MD  ferrous sulfate 325 (65 FE) MG tablet Take 325 mg by mouth daily with breakfast.   Yes Historical Provider, MD  finasteride (PROSCAR) 5 MG tablet Take 5 mg by mouth every evening.    Yes Historical Provider, MD  furosemide (LASIX) 40 MG tablet Take 40 mg by mouth every morning.    Yes Historical Provider, MD  insulin aspart (NOVOLOG FLEXPEN) 100 UNIT/ML FlexPen Inject 10 Units into the skin 3 (three) times daily with meals. Hold if bs is <80   Yes Historical Provider, MD  insulin glargine (LANTUS) 100 UNIT/ML  injection Inject 28 Units into the skin at bedtime. Hold is FSBS is less than 75   Yes Historical Provider, MD  isosorbide mononitrate (ISMO,MONOKET) 20 MG tablet Take 20 mg by mouth 2 (two) times daily.   Yes Historical Provider, MD  latanoprost (XALATAN) 0.005 % ophthalmic solution Place 1 drop into both eyes at bedtime.   Yes Historical Provider, MD  Linaclotide (LINZESS) 145 MCG CAPS capsule Take 145 mcg by mouth every morning.    Yes Historical Provider, MD  lisinopril (PRINIVIL,ZESTRIL) 10 MG tablet Take 10 mg by mouth every  morning.    Yes Historical Provider, MD  meloxicam (MOBIC) 15 MG tablet Take 15 mg by mouth daily as needed for pain.   Yes Historical Provider, MD  metoprolol tartrate (LOPRESSOR) 25 MG tablet Take 12.5 mg by mouth 2 (two) times daily.   Yes Historical Provider, MD  Nutritional Supplements (NUTRITIONAL DRINK PLUS) LIQD Take 90 mLs by mouth 3 (three) times daily.   Yes Historical Provider, MD  omeprazole (PRILOSEC) 20 MG capsule Take 40 mg by mouth every evening. 30 min prior to evening meal.   Yes Historical Provider, MD  risperiDONE (RISPERDAL) 2 MG tablet Take 2 mg by mouth at bedtime.   Yes Historical Provider, MD  senna (SENOKOT) 8.6 MG TABS Take 2 tablets by mouth at bedtime.   Yes Historical Provider, MD  traMADol (ULTRAM) 50 MG tablet Take 50 mg by mouth every 8 (eight) hours as needed for pain. Reported on 09/30/2015   Yes Historical Provider, MD  ciprofloxacin (CILOXAN) 0.3 % ophthalmic ointment Use 1 drop in each eye every 4 hours while awake for 5 days. Patient not taking: Reported on 09/30/2015 02/09/15   Junius Finner, PA-C  lisinopril (PRINIVIL,ZESTRIL) 5 MG tablet Take 1 tablet (5 mg total) by mouth daily. Patient not taking: Reported on 01/30/2015 02/05/13   Laveda Norman, MD  lubiprostone (AMITIZA) 24 MCG capsule Take 1 capsule (24 mcg total) by mouth 2 (two) times daily with a meal. Patient not taking: Reported on 01/30/2015 09/15/13   Dahlia Client Muthersbaugh, PA-C  timolol (TIMOPTIC) 0.5 % ophthalmic solution Place 1 drop into both eyes every 12 (twelve) hours. Patient not taking: Reported on 09/30/2015 01/30/15   Raeford Razor, MD    Physical Exam: Filed Vitals:   09/30/15 2215 09/30/15 2224 09/30/15 2245 09/30/15 2311  BP: 139/111  131/109   Pulse: 47  63   Temp:    98.2 F (36.8 C)  TempSrc:    Oral  Resp: 28  22   Height:    5\' 9"  (1.753 m)  Weight:  92.171 kg (203 lb 3.2 oz)  91.853 kg (202 lb 8 oz)  SpO2: 96%  92%      General:  Moderately built and nourished.  Eyes:  Anicteric no pallor.  ENT: No discharge from the ears eyes nose or mouth.  Neck: No mass felt.  Cardiovascular: S1-S2 heard.  Respiratory: No rhonchi or crepitations.  Abdomen: Soft nontender bowel sounds present.  Skin: No rash.  Musculoskeletal: No edema.  Psychiatric: Presently appears alert awake and oriented.  Neurologic: Alert awake oriented to time place and person. Moves all extremities.  Labs on Admission:  Basic Metabolic Panel:  Recent Labs Lab 09/30/15 1850  NA 145  K 3.8  CL 106  CO2 26  GLUCOSE 83  BUN 9  CREATININE 1.05  CALCIUM 9.2  MG 2.0  PHOS 3.5   Liver Function Tests: No results for input(s): AST,  ALT, ALKPHOS, BILITOT, PROT, ALBUMIN in the last 168 hours. No results for input(s): LIPASE, AMYLASE in the last 168 hours. No results for input(s): AMMONIA in the last 168 hours. CBC:  Recent Labs Lab 09/30/15 1850  WBC 7.2  HGB 14.8  HCT 43.9  MCV 93.4  PLT 221   Cardiac Enzymes: No results for input(s): CKTOTAL, CKMB, CKMBINDEX, TROPONINI in the last 168 hours.  BNP (last 3 results)  Recent Labs  09/30/15 2056  BNP 201.2*    ProBNP (last 3 results) No results for input(s): PROBNP in the last 8760 hours.  CBG: No results for input(s): GLUCAP in the last 168 hours.  Radiological Exams on Admission: Dg Chest 2 View  09/30/2015  CLINICAL DATA:  Chest pain EXAM: CHEST  2 VIEW COMPARISON:  09/14/2013 chest radiograph. FINDINGS: Stable cardiomediastinal silhouette with mild cardiomegaly. No pneumothorax. Trace left pleural effusion. Mild pulmonary edema. No acute consolidative airspace disease. IMPRESSION: Mild cardiomegaly and mild pulmonary edema, in keeping with mild congestive heart failure. Trace left pleural effusion. Electronically Signed   By: Delbert Phenix M.D.   On: 09/30/2015 20:03    EKG: Independently reviewed. A. fib with RVR.  Assessment/Plan Principal Problem:   Atrial fibrillation with RVR (HCC) Active  Problems:   MENTAL RETARDATION   Essential hypertension   Chest pain   Diabetes mellitus type 2, controlled (HCC)   Atrial fibrillation (HCC)   Pain in the chest   1. A. fib with RVR - this is new diagnosis for the patient. Chads 2 vasc score is 2. Patient is placed on heparin for now. Patient is on Cardizem infusion which can be slowly changed to oral Cardizem. Check 2-D echo. Cycle cardiac markers. Check thyroid function tests. Since patient also had episodes of shortness of breath we will check d-dimer. 2. Diabetes mellitus type 2 - Will continue with patient's home dose of Lantus and place patient on sliding scale coverage.  3. Hypertension  - continue clonidine and Toprol lisinopril Imdur. 4. History of mental retardation and schizophrenia - continue present medications.  5. BPH on finasteride.   DVT Prophylaxis - heparin infusion. Code Status: Full code.  Family Communication: Discussed with patient.  Disposition Plan: Admit to inpatient.    Arneta Mahmood N. [facility] Pager [phone].  If 7PM-7AM, please contact night-coverage [email or web address] Password [facility] 09/30/2015, 11:43 PM

## 2015-09-30 NOTE — ED Provider Notes (Signed)
CSN: [id number]     Arrival date & time 09/30/15  1800 History   First MD Initiated Contact with Patient 09/30/15 1807     Chief Complaint  Patient presents with  . Chest Pain     (Consider location/radiation/quality/duration/timing/severity/associated sxs/prior Treatment) Patient is a 70 y.o. male presenting with chest pain. The history is provided by the patient and the nursing home. The history is limited by the condition of the patient.  Chest Pain Pain location:  Substernal area Pain quality: sharp   Pain radiates to:  Does not radiate Pain radiates to the back: no   Pain severity:  Mild Onset quality:  Sudden Duration: started last night. Timing:  Intermittent Progression:  Waxing and waning Chronicity:  New Context: at rest   Relieved by:  None tried Worsened by:  Nothing tried Ineffective treatments:  None tried Associated symptoms: diaphoresis, nausea and shortness of breath   Associated symptoms: no abdominal pain, no altered mental status, no cough and no fever     Past Medical History  Diagnosis Date  . Diabetes mellitus   . Hypertension   . GERD (gastroesophageal reflux disease)   . Vitamin D deficiency   . Constipation   . Schizophrenia (HCC)   . Seizure (HCC)   . Edema   . Glaucoma   . CAD (coronary artery disease)    History reviewed. No pertinent past surgical history. Family History  Problem Relation Age of Onset  . Hypertension Mother    Social History  Substance Use Topics  . Smoking status: Current Every Day Smoker  . Smokeless tobacco: None  . Alcohol Use: No    Review of Systems  Constitutional: Positive for diaphoresis. Negative for fever.  HENT: Negative.   Eyes: Negative for visual disturbance.  Respiratory: Positive for shortness of breath. Negative for cough.   Cardiovascular: Positive for chest pain.  Gastrointestinal: Positive for nausea. Negative for abdominal pain and diarrhea.  Genitourinary: Negative.   Musculoskeletal:  Negative.   Skin: Negative.   Neurological: Negative.       Allergies  Review of patient's allergies indicates no known allergies.  Home Medications   Prior to Admission medications   Medication Sig Start Date End Date Taking? Authorizing Provider  acetaminophen (TYLENOL) 325 MG tablet Take 325-650 mg by mouth every 6 (six) hours as needed for mild pain or moderate pain.    Yes Historical Provider, MD  brimonidine (ALPHAGAN P) 0.1 % SOLN Place 1 drop into both eyes 2 (two) times daily.   Yes Historical Provider, MD  cholecalciferol (VITAMIN D) 1000 UNITS tablet Take 1,000 Units by mouth every morning.    Yes Historical Provider, MD  cloNIDine (CATAPRES) 0.1 MG tablet Take 0.1 mg by mouth as directed. Take one tablet by mouth every hour as needed for systolic blood pressure greater than 190 or diastolic blood pressure greater than 100. Not to exceed 0.6mg  in 24hrs.   Yes Historical Provider, MD  cycloSPORINE (RESTASIS) 0.05 % ophthalmic emulsion Place 1 drop into both eyes 2 (two) times daily.    Yes Historical Provider, MD  docusate sodium (COLACE) 100 MG capsule Take 100 mg by mouth 2 (two) times daily.   Yes Historical Provider, MD  ferrous sulfate 325 (65 FE) MG tablet Take 325 mg by mouth daily with breakfast.   Yes Historical Provider, MD  finasteride (PROSCAR) 5 MG tablet Take 5 mg by mouth every evening.    Yes Historical Provider, MD  furosemide (LASIX) 40 MG  tablet Take 40 mg by mouth every morning.    Yes Historical Provider, MD  insulin aspart (NOVOLOG FLEXPEN) 100 UNIT/ML FlexPen Inject 10 Units into the skin 3 (three) times daily with meals. Hold if bs is <80   Yes Historical Provider, MD  insulin glargine (LANTUS) 100 UNIT/ML injection Inject 28 Units into the skin at bedtime. Hold is FSBS is less than 75   Yes Historical Provider, MD  isosorbide mononitrate (ISMO,MONOKET) 20 MG tablet Take 20 mg by mouth 2 (two) times daily.   Yes Historical Provider, MD  latanoprost  (XALATAN) 0.005 % ophthalmic solution Place 1 drop into both eyes at bedtime.   Yes Historical Provider, MD  Linaclotide (LINZESS) 145 MCG CAPS capsule Take 145 mcg by mouth every morning.    Yes Historical Provider, MD  lisinopril (PRINIVIL,ZESTRIL) 10 MG tablet Take 10 mg by mouth every morning.    Yes Historical Provider, MD  meloxicam (MOBIC) 15 MG tablet Take 15 mg by mouth daily as needed for pain.   Yes Historical Provider, MD  metoprolol tartrate (LOPRESSOR) 25 MG tablet Take 12.5 mg by mouth 2 (two) times daily.   Yes Historical Provider, MD  Nutritional Supplements (NUTRITIONAL DRINK PLUS) LIQD Take 90 mLs by mouth 3 (three) times daily.   Yes Historical Provider, MD  omeprazole (PRILOSEC) 20 MG capsule Take 40 mg by mouth every evening. 30 min prior to evening meal.   Yes Historical Provider, MD  risperiDONE (RISPERDAL) 2 MG tablet Take 2 mg by mouth at bedtime.   Yes Historical Provider, MD  senna (SENOKOT) 8.6 MG TABS Take 2 tablets by mouth at bedtime.   Yes Historical Provider, MD  traMADol (ULTRAM) 50 MG tablet Take 50 mg by mouth every 8 (eight) hours as needed for pain. Reported on 09/30/2015   Yes Historical Provider, MD  ciprofloxacin (CILOXAN) 0.3 % ophthalmic ointment Use 1 drop in each eye every 4 hours while awake for 5 days. Patient not taking: Reported on 09/30/2015 02/09/15   Junius Finner, PA-C  lisinopril (PRINIVIL,ZESTRIL) 5 MG tablet Take 1 tablet (5 mg total) by mouth daily. Patient not taking: Reported on 01/30/2015 02/05/13   Laveda Norman, MD  lubiprostone (AMITIZA) 24 MCG capsule Take 1 capsule (24 mcg total) by mouth 2 (two) times daily with a meal. Patient not taking: Reported on 01/30/2015 09/15/13   Dahlia Client Muthersbaugh, PA-C  timolol (TIMOPTIC) 0.5 % ophthalmic solution Place 1 drop into both eyes every 12 (twelve) hours. Patient not taking: Reported on 09/30/2015 01/30/15   Raeford Razor, MD   BP 131/109 mmHg  Pulse 63  Temp(Src) 98.2 F (36.8 C) (Oral)  Resp 22  Ht  5\' 9"  (1.753 m)  Wt 91.853 kg  BMI 29.89 kg/m2  SpO2 92% Physical Exam  Constitutional: He appears well-developed and well-nourished. He appears ill.  HENT:  Head: Normocephalic and atraumatic.  Eyes: Pupils are equal, round, and reactive to light. No scleral icterus.  Neck: Normal range of motion. Neck supple.  Cardiovascular: Intact distal pulses.  An irregularly irregular rhythm present. Tachycardia present.   Pulmonary/Chest: Effort normal and breath sounds normal. No respiratory distress. He has no wheezes. He has no rales.  Abdominal: He exhibits no distension. There is no tenderness. There is no rebound and no guarding.  Musculoskeletal: He exhibits no edema or tenderness.  Neurological: He is alert. He has normal strength. He is disoriented. He displays no tremor. No cranial nerve deficit or sensory deficit. Coordination normal. GCS eye  subscore is 4. GCS verbal subscore is 4. GCS motor subscore is 5.  Skin: Skin is warm and dry. No rash noted. No erythema. No pallor.  Psychiatric: He has a normal mood and affect.  Nursing note and vitals reviewed.   ED Course  Procedures (including critical care time) Labs Review Labs Reviewed  BRAIN NATRIURETIC PEPTIDE - Abnormal; Notable for the following:    B Natriuretic Peptide 201.2 (*)    All other components within normal limits  MRSA PCR SCREENING  BASIC METABOLIC PANEL  CBC  MAGNESIUM  PHOSPHORUS  TSH  HEPARIN LEVEL (UNFRACTIONATED)  CBC  TROPONIN I  TROPONIN I  TROPONIN I  T4, FREE  T3, FREE  COMPREHENSIVE METABOLIC PANEL  CBC WITH DIFFERENTIAL/PLATELET  Rosezena Sensor, ED    Imaging Review Dg Chest 2 View  09/30/2015  CLINICAL DATA:  Chest pain EXAM: CHEST  2 VIEW COMPARISON:  09/14/2013 chest radiograph. FINDINGS: Stable cardiomediastinal silhouette with mild cardiomegaly. No pneumothorax. Trace left pleural effusion. Mild pulmonary edema. No acute consolidative airspace disease. IMPRESSION: Mild cardiomegaly and  mild pulmonary edema, in keeping with mild congestive heart failure. Trace left pleural effusion. Electronically Signed   By: Delbert Phenix M.D.   On: 09/30/2015 20:03   I have personally reviewed and evaluated these images and lab results as part of my medical decision-making.   EKG Interpretation   Date/Time:  Tuesday September 30 2015 18:06:26 EST Ventricular Rate:  150 PR Interval:    QRS Duration: 94 QT Interval:  323 QTC Calculation: 510 R Axis:   -28 Text Interpretation:  Atrial fibrillation with rapid V-rate Paired  ventricular premature complexes Inferior infarct, old Lateral leads are  also involved SINCE LAST TRACING HEART RATE HAS INCREASED Atrial  fibrillation New since previous tracing Confirmed by JACUBOWITZ  MD, SAM  [phone]) on 09/30/2015 6:15:42 PM      MDM   Final diagnoses:  Atrial fibrillation, unspecified type (HCC)  Chest pain, unspecified chest pain type    Patient is a 70 year old male with a history of mental retardation who presents from his skilled nursing facility for concerns of sharp chest pain that started last night. He also reports some shortness of breath. Further history and exam as above notable for tachycardia with an irregular rhythm but otherwise in no acute distress and hemodynamically stable. EKG with A. fib with RVR. No history of such in the past. Patient has a mildly elevated BNP but otherwise is reassuring labs and negative troponin. Patient started on diltiazem drip after a dilt bolus. Heart rate and chest pain improving. Patient will be admitted to medicine for further management and evaluated. Heparin started.   Marijean Niemann, MD 10/01/15 1259  Doug Sou, MD 10/03/15 1425

## 2015-09-30 NOTE — ED Notes (Signed)
MD at bedside. 

## 2015-09-30 NOTE — ED Notes (Signed)
Dr. Jacubowitz MD at bedside. 

## 2015-09-30 NOTE — ED Provider Notes (Signed)
6:30 PM Level V caveat patient Jordan Rowe retarded. Patient complains of chest pain since yesterday.. Pain report is constant. On exam patient appears in no distress. Lungs clear to auscultation heart tachycardic irregularly irregular. Abdomen nondistended nontender 7:30 PM patient feels improved chest pain is improving steadily after treatment with intravenous diltiazem drip. He does complain of mild dyspnea. Pulse oximetry is 99% he is in no respiratory distress 10 PM patient resting comfortably. Cardiac monitor shows atrial fibrillation at 80 bpm. Chest discomfort is minimal. Case discussed with Dr. Toniann Fail plan admit to step down unit. Iv heparin ordered CRITICAL CARE Performed by: Doug Sou Total critical care time: 30 minutes Critical care time was exclusive of separately billable procedures and treating other patients. Critical care was necessary to treat or prevent imminent or life-threatening deterioration. Critical care was time spent personally by me on the following activities: development of treatment plan with patient and/or surrogate as well as nursing, discussions with consultants, evaluation of patient's response to treatment, examination of patient, obtaining history from patient or surrogate, ordering and performing treatments and interventions, ordering and review of laboratory studies, ordering and review of radiographic studies, pulse oximetry and re-evaluation of patient's condition.  Doug Sou, MD 09/30/15 2209

## 2015-09-30 NOTE — Progress Notes (Signed)
ANTICOAGULATION CONSULT NOTE - Initial Consult  Pharmacy Consult for Heparin Indication: atrial fibrillation  No Known Allergies  Patient Measurements: Height: 5' 8.9" (175 cm) IBW/kg (Calculated) : 70.47 Heparin Dosing Weight:   Vital Signs: Temp: 98.1 F (36.7 C) (01/31 1933) Temp Source: Oral (01/31 1933) BP: 127/94 mmHg (01/31 2145) Pulse Rate: 52 (01/31 2145)  Labs:  Recent Labs  09/30/15 1850  HGB 14.8  HCT 43.9  PLT 221  CREATININE 1.05    CrCl cannot be calculated (Unknown ideal weight.).   Medical History: Past Medical History  Diagnosis Date  . Diabetes mellitus   . Hypertension   . GERD (gastroesophageal reflux disease)   . Vitamin D deficiency   . Constipation   . Schizophrenia   . Seizure   . Edema   . Glaucoma   . CAD (coronary artery disease)     Medications:   (Not in a hospital admission) Scheduled:  . aspirin  324 mg Oral Once   Infusions:  . diltiazem (CARDIZEM) infusion 15 mg/hr (09/30/15 2018)    Assessment: 70yo male with history of mental retardation presents from SNF with substernal CP. Pharmacy is consulted to dose heparin for Afib.   Goal of Therapy:  Heparin level 0.3-0.7 units/ml Monitor platelets by anticoagulation protocol: Yes   Plan:  Give 4000 units bolus x 1 Start heparin infusion at 1450 units/hr Check anti-Xa level in 6 hours and daily while on heparin Continue to monitor H&H and platelets  Arlean Hopping. Newman Pies, PharmD, BCPS Clinical Pharmacist Pager [phone] 09/30/2015,10:10 PM

## 2015-09-30 NOTE — ED Notes (Signed)
Per EMS: pt from [facility] for eval of substernal cp that began yesterday with no radiation. Pt reports diaphoresis with nausea. Given 324 mg of aspirin en route, no nitro given. EMS noted pt in afib on the monitor but reports no hx of same. nad ntoed.

## 2015-10-01 ENCOUNTER — Inpatient Hospital Stay (HOSPITAL_COMMUNITY): Payer: Medicare Other

## 2015-10-01 ENCOUNTER — Encounter (HOSPITAL_COMMUNITY): Payer: Self-pay | Admitting: Cardiology

## 2015-10-01 DIAGNOSIS — I10 Essential (primary) hypertension: Secondary | ICD-10-CM

## 2015-10-01 DIAGNOSIS — F209 Schizophrenia, unspecified: Secondary | ICD-10-CM | POA: Diagnosis present

## 2015-10-01 DIAGNOSIS — R079 Chest pain, unspecified: Secondary | ICD-10-CM

## 2015-10-01 DIAGNOSIS — F17200 Nicotine dependence, unspecified, uncomplicated: Secondary | ICD-10-CM | POA: Diagnosis present

## 2015-10-01 DIAGNOSIS — I4891 Unspecified atrial fibrillation: Secondary | ICD-10-CM

## 2015-10-01 LAB — COMPREHENSIVE METABOLIC PANEL
ALT: 45 U/L (ref 17–63)
ANION GAP: 7 (ref 5–15)
AST: 22 U/L (ref 15–41)
Albumin: 3.2 g/dL — ABNORMAL LOW (ref 3.5–5.0)
Alkaline Phosphatase: 46 U/L (ref 38–126)
BUN: 10 mg/dL (ref 6–20)
CALCIUM: 8.5 mg/dL — AB (ref 8.9–10.3)
CHLORIDE: 109 mmol/L (ref 101–111)
CO2: 28 mmol/L (ref 22–32)
Creatinine, Ser: 1.1 mg/dL (ref 0.61–1.24)
Glucose, Bld: 102 mg/dL — ABNORMAL HIGH (ref 65–99)
Potassium: 3.6 mmol/L (ref 3.5–5.1)
SODIUM: 144 mmol/L (ref 135–145)
Total Bilirubin: 1 mg/dL (ref 0.3–1.2)
Total Protein: 5.6 g/dL — ABNORMAL LOW (ref 6.5–8.1)

## 2015-10-01 LAB — MRSA PCR SCREENING: MRSA by PCR: NEGATIVE

## 2015-10-01 LAB — CBC WITH DIFFERENTIAL/PLATELET
BASOS PCT: 0 %
Basophils Absolute: 0 10*3/uL (ref 0.0–0.1)
EOS ABS: 0.2 10*3/uL (ref 0.0–0.7)
Eosinophils Relative: 2 %
HEMATOCRIT: 40 % (ref 39.0–52.0)
HEMOGLOBIN: 13.6 g/dL (ref 13.0–17.0)
LYMPHS ABS: 3.4 10*3/uL (ref 0.7–4.0)
Lymphocytes Relative: 42 %
MCH: 31.7 pg (ref 26.0–34.0)
MCHC: 34 g/dL (ref 30.0–36.0)
MCV: 93.2 fL (ref 78.0–100.0)
MONOS PCT: 9 %
Monocytes Absolute: 0.7 10*3/uL (ref 0.1–1.0)
NEUTROS ABS: 3.8 10*3/uL (ref 1.7–7.7)
NEUTROS PCT: 47 %
Platelets: 202 10*3/uL (ref 150–400)
RBC: 4.29 MIL/uL (ref 4.22–5.81)
RDW: 14.9 % (ref 11.5–15.5)
WBC: 8 10*3/uL (ref 4.0–10.5)

## 2015-10-01 LAB — HEPARIN LEVEL (UNFRACTIONATED)
HEPARIN UNFRACTIONATED: 1.44 [IU]/mL — AB (ref 0.30–0.70)
Heparin Unfractionated: 0.84 IU/mL — ABNORMAL HIGH (ref 0.30–0.70)

## 2015-10-01 LAB — T4, FREE: FREE T4: 1.2 ng/dL — AB (ref 0.61–1.12)

## 2015-10-01 LAB — GLUCOSE, CAPILLARY
GLUCOSE-CAPILLARY: 87 mg/dL (ref 65–99)
GLUCOSE-CAPILLARY: 92 mg/dL (ref 65–99)
GLUCOSE-CAPILLARY: 70 mg/dL (ref 65–99)
GLUCOSE-CAPILLARY: 104 mg/dL — AB (ref 65–99)

## 2015-10-01 LAB — TROPONIN I
Troponin I: <0.03 ng/mL (ref <0.031)
Troponin I: <0.03 ng/mL (ref <0.031)

## 2015-10-01 LAB — D-DIMER, QUANTITATIVE: D-Dimer, Quant: 0.51 ug/mL-FEU — ABNORMAL HIGH (ref 0.00–0.50)

## 2015-10-01 MED ORDER — DILTIAZEM HCL 30 MG PO TABS
30.0000 mg | ORAL_TABLET | Freq: Four times a day (QID) | ORAL | Status: DC
  Administered 2015-10-01 – 2015-10-04 (×12): 30 mg via ORAL
  Filled 2015-10-01 (×12): qty 1

## 2015-10-01 MED ORDER — IOHEXOL 350 MG/ML SOLN
100.0000 mL | Freq: Once | INTRAVENOUS | Status: AC | PRN
  Administered 2015-10-01: 100 mL via INTRAVENOUS

## 2015-10-01 MED ORDER — HEPARIN (PORCINE) IN NACL 100-0.45 UNIT/ML-% IJ SOLN: 900.0000 [IU]/h | INTRAMUSCULAR | Status: DC

## 2015-10-01 NOTE — Clinical Social Work Note (Signed)
Clinical Social Work Assessment  Patient Details  Name: Jordan Rowe MRN: [id number] Date of Birth: [birth year]  Date of referral:  10/01/15               Reason for consult:  Discharge Planning, Facility Placement                Permission sought to share information with:  Facility Sport and exercise psychologist, Family Supports Permission granted to share information::  Yes, Verbal Permission Granted  Name::     Coralyn Mark (Brother) [phone]  Agency::  [facility]  Relationship::     Contact Information:     Housing/Transportation Living arrangements for the past 2 months:  [place] of Information:  Patient, Other (Comment Required) (Brother Systems developer) Patient Interpreter Needed:  None Criminal Activity/Legal Involvement Pertinent to Current Situation/Hospitalization:  No - Comment as needed Significant Relationships:  Siblings, Significant Other Lives with:  Facility Resident Do you feel safe going back to the place where you live?  Yes Need for family participation in patient care:  Yes (Comment)  Care giving concerns:  Patient does not report any concerns at this time.   Social Worker assessment / plan:  CSW met with patient at bedside to complete assessment. The patient confirms that he is a resident of [facility] and plans to return to the facility once discharged. The patient requests that his  Brother Coralyn Mark be contacted regarding his care and discharge plan. Coralyn Mark confirms that he handles the patient's affairs. Coralyn Mark appreciative of CSW call and assistance. Coralyn Mark states that the patient has been at the ALF for about 8 years. CSW explained that CSW will assist with DC back to the ALF as long as that remains the appropriate level of care for the patient. CSW will follow.  Employment status:  Retired Forensic scientist:  Information systems manager, Medicaid In [place] PT Recommendations:  Not assessed at this time [place] / Referral to community resources:  Other (Comment  Required) (Information will be sent to [facility])  Patient/Family's Response to care:  Patient and family appear happy with the care the patient is receiving.   Patient/Family's Understanding of and Emotional Response to Diagnosis, Current Treatment, and Prognosis:  Patient and family appear to have fair understanding of reason for admission and diagnosis. Both understand what the patient's post DC needs may be.  Emotional Assessment Appearance:  Appears stated age Attitude/Demeanor/Rapport:  Other (Appropriate and welcoming of CSW.) Affect (typically observed):  Accepting, Appropriate, Calm, Pleasant Orientation:  Oriented to Self, Oriented to Place, Oriented to  Time Alcohol / Substance use:  Tobacco Use Psych involvement (Current and /or in the community):  No (Comment)  Discharge Needs  Concerns to be addressed:  Discharge Planning Concerns Readmission within the last 30 days:  No Current discharge risk:  Chronically ill, Psychiatric Illness Barriers to Discharge:  Continued Medical Work up   Rigoberto Noel, LCSW 10/01/2015, 11:32 AM

## 2015-10-01 NOTE — Progress Notes (Signed)
  Echocardiogram 2D Echocardiogram has been performed.  Tye Savoy 10/01/2015, 3:26 PM

## 2015-10-01 NOTE — Progress Notes (Addendum)
[facility] PROGRESS NOTE  Jerrold Haskell ZOX:[id number] DOB: February 02, 1946 DOA: 09/30/2015 PCP: PROVIDER NOT IN SYSTEM  Assessment/Plan: Chart reviewed. Patient examined. D/w Mr. Diona Fanti.  Principal Problem:   Atrial fibrillation with RVR (HCC): remains in rate controlled a fib. On heparin and cardizem gtt. Awaiting echo. Free T4 slightly elevated, but normal TSH. Would repeat in a few months. I do not see a d-dimer drawn, mentioned in the H&P. Will order. Possible NoAC and outpatient cardioversion on a month. Await cardiology attending eval. [facility] 3 Active Problems:   MENTAL RETARDATION: lives in group home   Essential hypertension: controlled. Continue current   Chest pain: MI ruled out. D dimer pending   Diabetes mellitus type 2, controlled (HCC): controlled. Continue current   Schizophrenia [facility]): has been cooperative and appropriate   Smoker   Code Status:  full Family Communication:   Disposition Plan:  Group home once workup complete and final cardiology recs   HPI/Subjective: No dyspnea. No CP today.  Objective: Filed Vitals:   10/01/15 0320 10/01/15 0423  BP: 99/83   Pulse: 29   Temp:  98.6 F (37 C)  Resp:     No intake or output data in the 24 hours ending 10/01/15 0812 Filed Weights   09/30/15 2224 09/30/15 2311 10/01/15 0423  Weight: 92.171 kg (203 lb 3.2 oz) 91.853 kg (202 lb 8 oz) 92.171 kg (203 lb 3.2 oz)   Tele: a fib. Rate around 80  Exam:   General:  Cooperative. Alert and appropriate  Cardiovascular: irreg irreg without MGR  Respiratory: CTA without WRR  Abdomen: S, NT, ND  Ext: no CCE  Basic Metabolic Panel:  Recent Labs Lab 09/30/15 1850 10/01/15 0526  NA 145 144  K 3.8 3.6  CL 106 109  CO2 26 28  GLUCOSE 83 102*  BUN 9 10  CREATININE 1.05 1.10  CALCIUM 9.2 8.5*  MG 2.0  --   PHOS 3.5  --    Liver Function Tests:  Recent Labs Lab 10/01/15 0526  AST 22  ALT 45  ALKPHOS 46  BILITOT 1.0  PROT 5.6*  ALBUMIN  3.2*   No results for input(s): LIPASE, AMYLASE in the last 168 hours. No results for input(s): AMMONIA in the last 168 hours. CBC:  Recent Labs Lab 09/30/15 1850 10/01/15 0526  WBC 7.2 8.0  NEUTROABS  --  3.8  HGB 14.8 13.6  HCT 43.9 40.0  MCV 93.4 93.2  PLT 221 202   Cardiac Enzymes:  Recent Labs Lab 10/01/15 0004 10/01/15 0526  TROPONINI <0.03 <0.03   BNP (last 3 results)  Recent Labs  09/30/15 2056  BNP 201.2*    ProBNP (last 3 results) No results for input(s): PROBNP in the last 8760 hours.  CBG:  Recent Labs Lab 10/01/15 0736  GLUCAP 87    Recent Results (from the past 240 hour(s))  MRSA PCR Screening     Status: None   Collection Time: 09/30/15 11:22 PM  Result Value Ref Range Status   MRSA by PCR NEGATIVE NEGATIVE Final    Comment:        The GeneXpert MRSA Assay (FDA approved for NASAL specimens only), is one component of a comprehensive MRSA colonization surveillance program. It is not intended to diagnose MRSA infection nor to guide or monitor treatment for MRSA infections.      Studies: Dg Chest 2 View  09/30/2015  CLINICAL DATA:  Chest pain EXAM: CHEST  2 VIEW COMPARISON:  09/14/2013 chest radiograph.  FINDINGS: Stable cardiomediastinal silhouette with mild cardiomegaly. No pneumothorax. Trace left pleural effusion. Mild pulmonary edema. No acute consolidative airspace disease. IMPRESSION: Mild cardiomegaly and mild pulmonary edema, in keeping with mild congestive heart failure. Trace left pleural effusion. Electronically Signed   By: Delbert Phenix M.D.   On: 09/30/2015 20:03    Scheduled Meds: . aspirin  324 mg Oral Once  . brimonidine  1 drop Both Eyes BID  . cholecalciferol  1,000 Units Oral q morning - 10a  . cycloSPORINE  1 drop Both Eyes BID  . docusate sodium  100 mg Oral BID  . ferrous sulfate  325 mg Oral Q breakfast  . finasteride  5 mg Oral QPM  . furosemide  40 mg Oral q morning - 10a  . insulin aspart  0-9 Units  Subcutaneous TID WC  . insulin aspart  10 Units Subcutaneous TID WC  . insulin glargine  28 Units Subcutaneous QHS  . isosorbide mononitrate  20 mg Oral BID  . latanoprost  1 drop Both Eyes QHS  . Linaclotide  145 mcg Oral q morning - 10a  . lisinopril  10 mg Oral q morning - 10a  . metoprolol tartrate  12.5 mg Oral BID  . pantoprazole  40 mg Oral Daily  . risperiDONE  2 mg Oral QHS  . senna  2 tablet Oral QHS   Continuous Infusions: . diltiazem (CARDIZEM) infusion 10 mg/hr (10/01/15 0312)  . heparin 1,450 Units/hr (09/30/15 2234)    Time spent: 25 minutes  Levina Boyack L  [facility] [email or web address], password [facility] 10/01/2015, 8:12 AM  LOS: 1 day

## 2015-10-01 NOTE — Progress Notes (Signed)
ANTICOAGULATION CONSULT NOTE - Consult  Pharmacy Consult for Heparin Indication: atrial fibrillation  No Known Allergies  Patient Measurements: Height:  (175.3 cm) Weight: 203 lb 3.2 oz (92.171 kg) IBW/kg (Calculated) : 70.7 Heparin Dosing Weight:   Vital Signs: Temp: 98 F (36.7 C) (02/01 0816) Temp Source: Oral (02/01 0816) BP: 132/92 mmHg (02/01 0816) Pulse Rate: 82 (02/01 0816)  Labs:  Recent Labs  09/30/15 1850 10/01/15 0004 10/01/15 0526  HGB 14.8  --  13.6  HCT 43.9  --  40.0  PLT 221  --  202  HEPARINUNFRC  --   --  0.84*  CREATININE 1.05  --  1.10  TROPONINI  --  <0.03 <0.03    Estimated Creatinine Clearance: 73.1 mL/min (by C-G formula based on Cr of 1.1).   Medical History: Past Medical History  Diagnosis Date  . Diabetes mellitus   . Hypertension   . GERD (gastroesophageal reflux disease)   . Vitamin D deficiency   . Constipation   . Schizophrenia (HCC)   . Seizure (HCC)   . Edema   . Glaucoma     Medications:  Prescriptions prior to admission  Medication Sig Dispense Refill Last Dose  . acetaminophen (TYLENOL) 325 MG tablet Take 325-650 mg by mouth every 6 (six) hours as needed for mild pain or moderate pain.    unknown  . brimonidine (ALPHAGAN P) 0.1 % SOLN Place 1 drop into both eyes 2 (two) times daily.   09/30/2015 at Unknown time  . cholecalciferol (VITAMIN D) 1000 UNITS tablet Take 1,000 Units by mouth every morning.    09/30/2015 at Unknown time  . cloNIDine (CATAPRES) 0.1 MG tablet Take 0.1 mg by mouth as directed. Take one tablet by mouth every hour as needed for systolic blood pressure greater than 190 or diastolic blood pressure greater than 100. Not to exceed 0.6mg  in 24hrs.   unknown  . cycloSPORINE (RESTASIS) 0.05 % ophthalmic emulsion Place 1 drop into both eyes 2 (two) times daily.    09/30/2015 at Unknown time  . docusate sodium (COLACE) 100 MG capsule Take 100 mg by mouth 2 (two) times daily.   09/30/2015 at Unknown time  .  ferrous sulfate 325 (65 FE) MG tablet Take 325 mg by mouth daily with breakfast.   09/30/2015 at Unknown time  . finasteride (PROSCAR) 5 MG tablet Take 5 mg by mouth every evening.    09/30/2015 at Unknown time  . furosemide (LASIX) 40 MG tablet Take 40 mg by mouth every morning.    09/30/2015 at Unknown time  . insulin aspart (NOVOLOG FLEXPEN) 100 UNIT/ML FlexPen Inject 10 Units into the skin 3 (three) times daily with meals. Hold if bs is <80   09/30/2015 at Unknown time  . insulin glargine (LANTUS) 100 UNIT/ML injection Inject 28 Units into the skin at bedtime. Hold is FSBS is less than 75   09/29/2015 at Unknown time  . isosorbide mononitrate (ISMO,MONOKET) 20 MG tablet Take 20 mg by mouth 2 (two) times daily.   09/30/2015 at Unknown time  . latanoprost (XALATAN) 0.005 % ophthalmic solution Place 1 drop into both eyes at bedtime.   09/29/2015 at Unknown time  . Linaclotide (LINZESS) 145 MCG CAPS capsule Take 145 mcg by mouth every morning.    09/30/2015 at Unknown time  . lisinopril (PRINIVIL,ZESTRIL) 10 MG tablet Take 10 mg by mouth every morning.    09/30/2015 at Unknown time  . meloxicam (MOBIC) 15 MG tablet Take 15 mg  by mouth daily as needed for pain.   unknown  . metoprolol tartrate (LOPRESSOR) 25 MG tablet Take 12.5 mg by mouth 2 (two) times daily.   09/30/2015 at 800  . Nutritional Supplements (NUTRITIONAL DRINK PLUS) LIQD Take 90 mLs by mouth 3 (three) times daily.   09/30/2015 at Unknown time  . omeprazole (PRILOSEC) 20 MG capsule Take 40 mg by mouth every evening. 30 min prior to evening meal.   09/29/2015 at Unknown time  . risperiDONE (RISPERDAL) 2 MG tablet Take 2 mg by mouth at bedtime.   09/29/2015 at Unknown time  . senna (SENOKOT) 8.6 MG TABS Take 2 tablets by mouth at bedtime.   09/29/2015 at Unknown time  . traMADol (ULTRAM) 50 MG tablet Take 50 mg by mouth every 8 (eight) hours as needed for pain. Reported on 09/30/2015   unknown  . ciprofloxacin (CILOXAN) 0.3 % ophthalmic ointment Use 1  drop in each eye every 4 hours while awake for 5 days. (Patient not taking: Reported on 09/30/2015) 3.5 g 0 Not Taking at Unknown time  . lisinopril (PRINIVIL,ZESTRIL) 5 MG tablet Take 1 tablet (5 mg total) by mouth daily. (Patient not taking: Reported on 01/30/2015) 30 tablet 0 Not Taking at Unknown time  . lubiprostone (AMITIZA) 24 MCG capsule Take 1 capsule (24 mcg total) by mouth 2 (two) times daily with a meal. (Patient not taking: Reported on 01/30/2015) 30 capsule 0 Not Taking at Unknown time  . timolol (TIMOPTIC) 0.5 % ophthalmic solution Place 1 drop into both eyes every 12 (twelve) hours. (Patient not taking: Reported on 09/30/2015) 5 mL 0 Not Taking at Unknown time   Scheduled:  . aspirin  324 mg Oral Once  . brimonidine  1 drop Both Eyes BID  . cholecalciferol  1,000 Units Oral q morning - 10a  . cycloSPORINE  1 drop Both Eyes BID  . docusate sodium  100 mg Oral BID  . ferrous sulfate  325 mg Oral Q breakfast  . finasteride  5 mg Oral QPM  . furosemide  40 mg Oral q morning - 10a  . insulin aspart  0-9 Units Subcutaneous TID WC  . insulin aspart  10 Units Subcutaneous TID WC  . insulin glargine  28 Units Subcutaneous QHS  . isosorbide mononitrate  20 mg Oral BID  . latanoprost  1 drop Both Eyes QHS  . Linaclotide  145 mcg Oral q morning - 10a  . lisinopril  10 mg Oral q morning - 10a  . metoprolol tartrate  12.5 mg Oral BID  . pantoprazole  40 mg Oral Daily  . risperiDONE  2 mg Oral QHS  . senna  2 tablet Oral QHS   Infusions:  . diltiazem (CARDIZEM) infusion 10 mg/hr (10/01/15 0312)  . heparin 1,450 Units/hr (09/30/15 2234)    Assessment: 70yo male with history of mental retardation presents from SNF with substernal CP. Pharmacy is consulted to dose heparin for Afib.   Goal of Therapy:  Heparin level 0.3-0.7 units/ml Monitor platelets by anticoagulation protocol: Yes   Plan:  Decrease heparin infusion to1300 units/hr Check anti-Xa level in 6 hours and daily while on  heparin Continue to monitor H&H and platelets  Thank you for allowing [place] to participate in this patients care. Signe Colt, PharmD  10/01/2015,8:46 AM

## 2015-10-01 NOTE — Progress Notes (Signed)
Utilization review completed. Graves Nipp, RN, BSN. 

## 2015-10-01 NOTE — Progress Notes (Signed)
ANTICOAGULATION CONSULT NOTE - Consult  Pharmacy Consult for Heparin Indication: atrial fibrillation  No Known Allergies  Patient Measurements: Height: 5\' 9"  (175.3 cm) Weight: 203 lb 3.2 oz (92.171 kg) IBW/kg (Calculated) : 70.7 Heparin Dosing Weight:   Vital Signs: Temp: 97.8 F (36.6 C) (02/01 1654) Temp Source: Oral (02/01 1654) BP: 130/92 mmHg (02/01 1654) Pulse Rate: 62 (02/01 1654)  Labs:  Recent Labs  09/30/15 1850 10/01/15 0004 10/01/15 0526 10/01/15 1144 10/01/15 1635  HGB 14.8  --  13.6  --   --   HCT 43.9  --  40.0  --   --   PLT 221  --  202  --   --   HEPARINUNFRC  --   --  0.84*  --  1.44*  CREATININE 1.05  --  1.10  --   --   TROPONINI  --  <0.03 <0.03 <0.03  --     Estimated Creatinine Clearance: 73.1 mL/min (by C-G formula based on Cr of 1.1).   Medical History: Past Medical History  Diagnosis Date  . Hypertension   . GERD (gastroesophageal reflux disease)   . Vitamin D deficiency   . Constipation   . Schizophrenia (HCC)   . Seizure (HCC)   . Edema   . Glaucoma   . Mental retardation     Jordan Rowe 09/30/2015  . Atrial fibrillation with RVR (HCC)     Jordan Rowe 09/30/2015  . Type II diabetes mellitus (HCC)     Jordan Rowe 09/30/2015  . BPH (benign prostatic hypertrophy)     Jordan Rowe 09/30/2015    Medications:  Prescriptions prior to admission  Medication Sig Dispense Refill Last Dose  . acetaminophen (TYLENOL) 325 MG tablet Take 325-650 mg by mouth every 6 (six) hours as needed for mild pain or moderate pain.    unknown  . brimonidine (ALPHAGAN P) 0.1 % SOLN Place 1 drop into both eyes 2 (two) times daily.   09/30/2015 at Unknown time  . cholecalciferol (VITAMIN D) 1000 UNITS tablet Take 1,000 Units by mouth every morning.    09/30/2015 at Unknown time  . cloNIDine (CATAPRES) 0.1 MG tablet Take 0.1 mg by mouth as directed. Take one tablet by mouth every hour as needed for systolic blood pressure greater than 190 or diastolic blood pressure greater than  100. Not to exceed 0.6mg  in 24hrs.   unknown  . cycloSPORINE (RESTASIS) 0.05 % ophthalmic emulsion Place 1 drop into both eyes 2 (two) times daily.    09/30/2015 at Unknown time  . docusate sodium (COLACE) 100 MG capsule Take 100 mg by mouth 2 (two) times daily.   09/30/2015 at Unknown time  . ferrous sulfate 325 (65 FE) MG tablet Take 325 mg by mouth daily with breakfast.   09/30/2015 at Unknown time  . finasteride (PROSCAR) 5 MG tablet Take 5 mg by mouth every evening.    09/30/2015 at Unknown time  . furosemide (LASIX) 40 MG tablet Take 40 mg by mouth every morning.    09/30/2015 at Unknown time  . insulin aspart (NOVOLOG FLEXPEN) 100 UNIT/ML FlexPen Inject 10 Units into the skin 3 (three) times daily with meals. Hold if bs is <80   09/30/2015 at Unknown time  . insulin glargine (LANTUS) 100 UNIT/ML injection Inject 28 Units into the skin at bedtime. Hold is FSBS is less than 75   09/29/2015 at Unknown time  . isosorbide mononitrate (ISMO,MONOKET) 20 MG tablet Take 20 mg by mouth 2 (two) times daily.  09/30/2015 at Unknown time  . latanoprost (XALATAN) 0.005 % ophthalmic solution Place 1 drop into both eyes at bedtime.   09/29/2015 at Unknown time  . Linaclotide (LINZESS) 145 MCG CAPS capsule Take 145 mcg by mouth every morning.    09/30/2015 at Unknown time  . lisinopril (PRINIVIL,ZESTRIL) 10 MG tablet Take 10 mg by mouth every morning.    09/30/2015 at Unknown time  . meloxicam (MOBIC) 15 MG tablet Take 15 mg by mouth daily as needed for pain.   unknown  . metoprolol tartrate (LOPRESSOR) 25 MG tablet Take 12.5 mg by mouth 2 (two) times daily.   09/30/2015 at 800  . Nutritional Supplements (NUTRITIONAL DRINK PLUS) LIQD Take 90 mLs by mouth 3 (three) times daily.   09/30/2015 at Unknown time  . omeprazole (PRILOSEC) 20 MG capsule Take 40 mg by mouth every evening. 30 min prior to evening meal.   09/29/2015 at Unknown time  . risperiDONE (RISPERDAL) 2 MG tablet Take 2 mg by mouth at bedtime.   09/29/2015 at  Unknown time  . senna (SENOKOT) 8.6 MG TABS Take 2 tablets by mouth at bedtime.   09/29/2015 at Unknown time  . traMADol (ULTRAM) 50 MG tablet Take 50 mg by mouth every 8 (eight) hours as needed for pain. Reported on 09/30/2015   unknown  . ciprofloxacin (CILOXAN) 0.3 % ophthalmic ointment Use 1 drop in each eye every 4 hours while awake for 5 days. (Patient not taking: Reported on 09/30/2015) 3.5 g 0 Not Taking at Unknown time  . lisinopril (PRINIVIL,ZESTRIL) 5 MG tablet Take 1 tablet (5 mg total) by mouth daily. (Patient not taking: Reported on 01/30/2015) 30 tablet 0 Not Taking at Unknown time  . lubiprostone (AMITIZA) 24 MCG capsule Take 1 capsule (24 mcg total) by mouth 2 (two) times daily with a meal. (Patient not taking: Reported on 01/30/2015) 30 capsule 0 Not Taking at Unknown time  . timolol (TIMOPTIC) 0.5 % ophthalmic solution Place 1 drop into both eyes every 12 (twelve) hours. (Patient not taking: Reported on 09/30/2015) 5 mL 0 Not Taking at Unknown time   Scheduled:  . aspirin  324 mg Oral Once  . brimonidine  1 drop Both Eyes BID  . cholecalciferol  1,000 Units Oral q morning - 10a  . cycloSPORINE  1 drop Both Eyes BID  . diltiazem  30 mg Oral 4 times per day  . docusate sodium  100 mg Oral BID  . ferrous sulfate  325 mg Oral Q breakfast  . finasteride  5 mg Oral QPM  . furosemide  40 mg Oral q morning - 10a  . insulin aspart  0-9 Units Subcutaneous TID WC  . insulin aspart  10 Units Subcutaneous TID WC  . insulin glargine  28 Units Subcutaneous QHS  . isosorbide mononitrate  20 mg Oral BID  . latanoprost  1 drop Both Eyes QHS  . Linaclotide  145 mcg Oral q morning - 10a  . lisinopril  10 mg Oral q morning - 10a  . metoprolol tartrate  12.5 mg Oral BID  . pantoprazole  40 mg Oral Daily  . risperiDONE  2 mg Oral QHS  . senna  2 tablet Oral QHS   Infusions:  . heparin 1,300 Units/hr (10/01/15 1700)    Assessment: 70yo male with history of mental retardation presents from SNF  with substernal CP. Pharmacy is consulted to dose heparin for Afib. Heparin gtt started at 1,450 units/hr and first level was elevated at  0.84. Rate decreased and next level drawn even higher at 1.44. CBC stable, no s/s of bleed.  Goal of Therapy:  Heparin level 0.3-0.7 units/ml Monitor platelets by anticoagulation protocol: Yes   Plan:  Hold heparin gtt for 1 hr Then decrease heparin infusion to1100 units/hr Check 6 hr HL Monitor daily HL, CBC, s/s of bleed  Enzo Bi, PharmD, [facility] Clinical Pharmacist Pager [phone] 10/01/2015 5:54 PM

## 2015-10-01 NOTE — Care Management Note (Addendum)
Case Management Note  Patient Details  Name: Jordan Rowe MRN: [id number] Date of Birth: 05/15/1946  Subjective/Objective: Pt admitted for Atrial Fib and Plan is for Eliquis for home. Benefits check completed and stated pt has Medicaid- cost should be $3.00. Pt is from ALF [facility].   Action/Plan: CM will provide pt with the 30 day free card   Expected Discharge Date:                  Expected Discharge Plan: Assisted Living In-House Referral:  NA  Discharge planning Services  CM Consult, Medication Assistance  Post Acute Care Choice:  NA Choice offered to:  NA  DME Arranged:  N/A DME Agency:  NA  HH Arranged:  NA HH Agency:  NA  Status of Service:  Completed, signed off  Medicare Important Message Given:    Date Medicare IM Given:    Medicare IM give by:    Date Additional Medicare IM Given:    Additional Medicare Important Message give by:     If discussed at Long Length of Stay Meetings, dates discussed:    Additional Comments: No further needs from CM- Eliquis card in packet for ALF.  10-07-15 1143 Tomi Bamberger, RN, BSN [phone]  1015 10-02-15 Brandon Wiechman Graves-Bigelow, RN,BSN [phone] CM will provide CSW with the 30 day free Eliquis Card to place i packet for facility to utilize. No further needs from CM.   Gala Lewandowsky, RN 10/01/2015, 3:00 PM

## 2015-10-01 NOTE — Consult Note (Signed)
Reason for Consult:   AF Requesting Physician: [facility] Primary Cardiologist Dr Anne Fu (saw in 2011)  HPI:   70 y/o AA male with a history of schizophrenia and mental retardation. He was admitted through the ED yesterday 6 pm with AF with RVR, and chest pain. The pt saw Dr Anne Fu in 2011 and had a Myoview pre op that was low risk. He had an echo in 2014 when he was admitted with dehydration, EF 60%. He lives in a group home and says his medications are distributed to him. He developed chest pain yesterday afternoon and was taken to the ED. In The ED he was noted to be in AF with RVR, new for him. He feels better this am, still in AF, now with rate in the 80's. "CAD" listed in his history but I could find no documentation of that and the pt says he never saw a heart doctor but I suspect his history is unreliable.   PMHx:  Past Medical History  Diagnosis Date  . Diabetes mellitus   . Hypertension   . GERD (gastroesophageal reflux disease)   . Vitamin D deficiency   . Constipation   . Schizophrenia (HCC)   . Seizure (HCC)   . Edema   . Glaucoma     History reviewed. No pertinent past surgical history.  SOCHx:  reports that he has been smoking.  He does not have any smokeless tobacco history on file. He reports that he does not drink alcohol or use illicit drugs.  FAMHx: Family History  Problem Relation Age of Onset  . Hypertension Mother     ALLERGIES: No Known Allergies  ROS: Review of Systems: General: negative for chills, fever, night sweats or weight changes.  Cardiovascular: negative dyspnea on exertion, edema, orthopnea, palpitations, paroxysmal nocturnal dyspnea or shortness of breath HEENT: negative for any visual disturbances, blindness, glaucoma Dermatological: negative for rash Respiratory: negative for cough, hemoptysis, or wheezing Urologic: negative for hematuria or dysuria Abdominal: negative for nausea, vomiting, diarrhea, bright red blood  per rectum, melena, or hematemesis Neurologic: negative for visual changes, syncope, or dizziness Musculoskeletal: negative for back pain, joint pain, or swelling Psych: cooperative and appropriate All other systems reviewed and are otherwise negative except as noted above.   HOME MEDICATIONS: Prior to Admission medications   Medication Sig Start Date End Date Taking? Authorizing Provider  acetaminophen (TYLENOL) 325 MG tablet Take 325-650 mg by mouth every 6 (six) hours as needed for mild pain or moderate pain.    Yes Historical Provider, MD  brimonidine (ALPHAGAN P) 0.1 % SOLN Place 1 drop into both eyes 2 (two) times daily.   Yes Historical Provider, MD  cholecalciferol (VITAMIN D) 1000 UNITS tablet Take 1,000 Units by mouth every morning.    Yes Historical Provider, MD  cloNIDine (CATAPRES) 0.1 MG tablet Take 0.1 mg by mouth as directed. Take one tablet by mouth every hour as needed for systolic blood pressure greater than 190 or diastolic blood pressure greater than 100. Not to exceed 0.6mg  in 24hrs.   Yes Historical Provider, MD  cycloSPORINE (RESTASIS) 0.05 % ophthalmic emulsion Place 1 drop into both eyes 2 (two) times daily.    Yes Historical Provider, MD  docusate sodium (COLACE) 100 MG capsule Take 100 mg by mouth 2 (two) times daily.   Yes Historical Provider, MD  ferrous sulfate 325 (65 FE) MG tablet Take 325 mg by mouth daily with breakfast.   Yes  Historical Provider, MD  finasteride (PROSCAR) 5 MG tablet Take 5 mg by mouth every evening.    Yes Historical Provider, MD  furosemide (LASIX) 40 MG tablet Take 40 mg by mouth every morning.    Yes Historical Provider, MD  insulin aspart (NOVOLOG FLEXPEN) 100 UNIT/ML FlexPen Inject 10 Units into the skin 3 (three) times daily with meals. Hold if bs is <80   Yes Historical Provider, MD  insulin glargine (LANTUS) 100 UNIT/ML injection Inject 28 Units into the skin at bedtime. Hold is FSBS is less than 75   Yes Historical Provider, MD    isosorbide mononitrate (ISMO,MONOKET) 20 MG tablet Take 20 mg by mouth 2 (two) times daily.   Yes Historical Provider, MD  latanoprost (XALATAN) 0.005 % ophthalmic solution Place 1 drop into both eyes at bedtime.   Yes Historical Provider, MD  Linaclotide (LINZESS) 145 MCG CAPS capsule Take 145 mcg by mouth every morning.    Yes Historical Provider, MD  lisinopril (PRINIVIL,ZESTRIL) 10 MG tablet Take 10 mg by mouth every morning.    Yes Historical Provider, MD  meloxicam (MOBIC) 15 MG tablet Take 15 mg by mouth daily as needed for pain.   Yes Historical Provider, MD  metoprolol tartrate (LOPRESSOR) 25 MG tablet Take 12.5 mg by mouth 2 (two) times daily.   Yes Historical Provider, MD  Nutritional Supplements (NUTRITIONAL DRINK PLUS) LIQD Take 90 mLs by mouth 3 (three) times daily.   Yes Historical Provider, MD  omeprazole (PRILOSEC) 20 MG capsule Take 40 mg by mouth every evening. 30 min prior to evening meal.   Yes Historical Provider, MD  risperiDONE (RISPERDAL) 2 MG tablet Take 2 mg by mouth at bedtime.   Yes Historical Provider, MD  senna (SENOKOT) 8.6 MG TABS Take 2 tablets by mouth at bedtime.   Yes Historical Provider, MD  traMADol (ULTRAM) 50 MG tablet Take 50 mg by mouth every 8 (eight) hours as needed for pain. Reported on 09/30/2015   Yes Historical Provider, MD  ciprofloxacin (CILOXAN) 0.3 % ophthalmic ointment Use 1 drop in each eye every 4 hours while awake for 5 days. Patient not taking: Reported on 09/30/2015 02/09/15   Junius Finner, PA-C  lisinopril (PRINIVIL,ZESTRIL) 5 MG tablet Take 1 tablet (5 mg total) by mouth daily. Patient not taking: Reported on 01/30/2015 02/05/13   Laveda Norman, MD  lubiprostone (AMITIZA) 24 MCG capsule Take 1 capsule (24 mcg total) by mouth 2 (two) times daily with a meal. Patient not taking: Reported on 01/30/2015 09/15/13   Dahlia Client Muthersbaugh, PA-C  timolol (TIMOPTIC) 0.5 % ophthalmic solution Place 1 drop into both eyes every 12 (twelve) hours. Patient not  taking: Reported on 09/30/2015 01/30/15   Raeford Razor, MD    HOSPITAL MEDICATIONS: I have reviewed the patient's current medications.  VITALS: Blood pressure 99/83, pulse 29, temperature 98.6 F (37 C), temperature source Oral, resp. rate 22, height 5\' 9"  (1.753 m), weight 203 lb 3.2 oz (92.171 kg), SpO2 100 %.  PHYSICAL EXAM: General appearance: alert, cooperative, no distress and moderately obese Neck: no carotid bruit and no JVD Lungs: clear to auscultation bilaterally Heart: irregularly irregular rhythm Abdomen: soft, non-tender; bowel sounds normal; no masses,  no organomegaly and small umbilical hernia Extremities: extremities normal, atraumatic, no cyanosis or edema Pulses: 2+ and symmetric Skin: Skin color, texture, turgor normal. No rashes or lesions Neurologic: Grossly normal  LABS: Results for orders placed or performed during the hospital encounter of 09/30/15 (from the past 24  hour(s))  Basic metabolic panel     Status: None   Collection Time: 09/30/15  6:50 PM  Result Value Ref Range   Sodium 145 135 - 145 mmol/L   Potassium 3.8 3.5 - 5.1 mmol/L   Chloride 106 101 - 111 mmol/L   CO2 26 22 - 32 mmol/L   Glucose, Bld 83 65 - 99 mg/dL   BUN 9 6 - 20 mg/dL   Creatinine, Ser 4.09 0.61 - 1.24 mg/dL   Calcium 9.2 8.9 - 81.1 mg/dL   GFR calc non Af Amer >60 >60 mL/min   GFR calc Af Amer >60 >60 mL/min   Anion gap 13 5 - 15  CBC     Status: None   Collection Time: 09/30/15  6:50 PM  Result Value Ref Range   WBC 7.2 4.0 - 10.5 K/uL   RBC 4.70 4.22 - 5.81 MIL/uL   Hemoglobin 14.8 13.0 - 17.0 g/dL   HCT 91.4 78.2 - 95.6 %   MCV 93.4 78.0 - 100.0 fL   MCH 31.5 26.0 - 34.0 pg   MCHC 33.7 30.0 - 36.0 g/dL   RDW 21.3 08.6 - 57.8 %   Platelets 221 150 - 400 K/uL  Magnesium     Status: None   Collection Time: 09/30/15  6:50 PM  Result Value Ref Range   Magnesium 2.0 1.7 - 2.4 mg/dL  Phosphorus     Status: None   Collection Time: 09/30/15  6:50 PM  Result Value Ref  Range   Phosphorus 3.5 2.5 - 4.6 mg/dL  I-stat troponin, ED (not at [facility])     Status: None   Collection Time: 09/30/15  7:02 PM  Result Value Ref Range   Troponin i, poc 0.02 0.00 - 0.08 ng/mL   Comment 3          TSH     Status: None   Collection Time: 09/30/15  8:56 PM  Result Value Ref Range   TSH 2.092 0.350 - 4.500 uIU/mL  Brain natriuretic peptide     Status: Abnormal   Collection Time: 09/30/15  8:56 PM  Result Value Ref Range   B Natriuretic Peptide 201.2 (H) 0.0 - 100.0 pg/mL  MRSA PCR Screening     Status: None   Collection Time: 09/30/15 11:22 PM  Result Value Ref Range   MRSA by PCR NEGATIVE NEGATIVE  Troponin I (q 6hr x 3)     Status: None   Collection Time: 10/01/15 12:04 AM  Result Value Ref Range   Troponin I <0.03 <0.031 ng/mL  T4, free     Status: Abnormal   Collection Time: 10/01/15 12:04 AM  Result Value Ref Range   Free T4 1.20 (H) 0.61 - 1.12 ng/dL  Troponin I (q 6hr x 3)     Status: None   Collection Time: 10/01/15  5:26 AM  Result Value Ref Range   Troponin I <0.03 <0.031 ng/mL  Comprehensive metabolic panel     Status: Abnormal   Collection Time: 10/01/15  5:26 AM  Result Value Ref Range   Sodium 144 135 - 145 mmol/L   Potassium 3.6 3.5 - 5.1 mmol/L   Chloride 109 101 - 111 mmol/L   CO2 28 22 - 32 mmol/L   Glucose, Bld 102 (H) 65 - 99 mg/dL   BUN 10 6 - 20 mg/dL   Creatinine, Ser 4.69 0.61 - 1.24 mg/dL   Calcium 8.5 (L) 8.9 - 10.3 mg/dL   Total  Protein 5.6 (L) 6.5 - 8.1 g/dL   Albumin 3.2 (L) 3.5 - 5.0 g/dL   AST 22 15 - 41 U/L   ALT 45 17 - 63 U/L   Alkaline Phosphatase 46 38 - 126 U/L   Total Bilirubin 1.0 0.3 - 1.2 mg/dL   GFR calc non Af Amer >60 >60 mL/min   GFR calc Af Amer >60 >60 mL/min   Anion gap 7 5 - 15  CBC WITH DIFFERENTIAL     Status: None   Collection Time: 10/01/15  5:26 AM  Result Value Ref Range   WBC 8.0 4.0 - 10.5 K/uL   RBC 4.29 4.22 - 5.81 MIL/uL   Hemoglobin 13.6 13.0 - 17.0 g/dL   HCT 79.8 92.1 - 19.4 %    MCV 93.2 78.0 - 100.0 fL   MCH 31.7 26.0 - 34.0 pg   MCHC 34.0 30.0 - 36.0 g/dL   RDW 17.4 08.1 - 44.8 %   Platelets 202 150 - 400 K/uL   Neutrophils Relative % 47 %   Neutro Abs 3.8 1.7 - 7.7 K/uL   Lymphocytes Relative 42 %   Lymphs Abs 3.4 0.7 - 4.0 K/uL   Monocytes Relative 9 %   Monocytes Absolute 0.7 0.1 - 1.0 K/uL   Eosinophils Relative 2 %   Eosinophils Absolute 0.2 0.0 - 0.7 K/uL   Basophils Relative 0 %   Basophils Absolute 0.0 0.0 - 0.1 K/uL    EKG: AF with RVR, Qs 3, AVF. Telem: AF-80 with PVCs  IMAGING: Dg Chest 2 View  09/30/2015  CLINICAL DATA:  Chest pain EXAM: CHEST  2 VIEW COMPARISON:  09/14/2013 chest radiograph. FINDINGS: Stable cardiomediastinal silhouette with mild cardiomegaly. No pneumothorax. Trace left pleural effusion. Mild pulmonary edema. No acute consolidative airspace disease. IMPRESSION: Mild cardiomegaly and mild pulmonary edema, in keeping with mild congestive heart failure. Trace left pleural effusion. Electronically Signed   By: Delbert Phenix M.D.   On: 09/30/2015 20:03    IMPRESSION: Principal Problem:   Atrial fibrillation with RVR (HCC) Active Problems:   Chest pain   Essential hypertension   Diabetes mellitus type 2, controlled (HCC)   MENTAL RETARDATION   Schizophrenia (HCC)   Smoker   RECOMMENDATION: CHADs VASc= 3 for age, HTN, DM. Will review social situation but it sounds like he is in a supervised facility so a NOAC would be reasonable. ? Anticoagulate and rate control x 4 weeks then DCCV- will review with MD.   Time Spent Directly with Patient: [address] minutes  [place], [place]  [phone] beeper 10/01/2015, 7:24 AM    Patient seen and examined. Agree with assessment and plan.  Jordan Rowe is a 70 year old African-American male who has a history of mental retardation and schizophrenia, type 2 diabetes mellitus, and hypertension.  He had developed episodes of intermittent chest pain yesterday.  The patient is unaware of any  known cardiac arrhythmia.  Upon presentation to the emergency room he was found to be in atrial fibrillation with a VAC rapid ventricular response with rate of approximately 150.  He was started on heparin and intravenous Cardizem.  His ventricular rate has now slowed into the upper 70s.  His troponins have been negative.  His ECG has not shown any significant ST-T changes.  BMP was minimally increased at 201.  There is no documentation of CAD, but the patient has been on Ismo for nitrate therapy.  Upon questioning, the patient states that his medications are provided to  him at the facility where he lives.  I discussed with him the potential thromboembolic risk associated with atrial fibrillation.  I do feel he is a candidate to institute NOAC therapy with either Xarelto 20 mg daily or eliquis 5 mg twice a day.  A 2-D echo Doppler study will be obtained to assess systolic and diastolic function.  With his ventricular rate now controlled in the 70s on IV Cardizem it may be worthwhile to transition to oral Cardizem therapy as well as his beta blocker regimen.  Heparin can be discontinued once he receives his first dose of NOAC therapy.  Following 4 weeks of anticoagulation therapy, if he maintains atrial fibrillation DC cardioversion can be performed.   Lennette Bihari, MD, [facility] 10/01/2015 10:23 AM

## 2015-10-02 DIAGNOSIS — E11649 Type 2 diabetes mellitus with hypoglycemia without coma: Secondary | ICD-10-CM

## 2015-10-02 DIAGNOSIS — I429 Cardiomyopathy, unspecified: Secondary | ICD-10-CM

## 2015-10-02 DIAGNOSIS — Z794 Long term (current) use of insulin: Secondary | ICD-10-CM

## 2015-10-02 DIAGNOSIS — F209 Schizophrenia, unspecified: Secondary | ICD-10-CM

## 2015-10-02 LAB — GLUCOSE, CAPILLARY
GLUCOSE-CAPILLARY: 59 mg/dL — AB (ref 65–99)
Glucose-Capillary: 130 mg/dL — ABNORMAL HIGH (ref 65–99)
Glucose-Capillary: 114 mg/dL — ABNORMAL HIGH (ref 65–99)
Glucose-Capillary: 108 mg/dL — ABNORMAL HIGH (ref 65–99)
Glucose-Capillary: 107 mg/dL — ABNORMAL HIGH (ref 65–99)

## 2015-10-02 LAB — CBC
HEMATOCRIT: 39.4 % (ref 39.0–52.0)
Hemoglobin: 13.4 g/dL (ref 13.0–17.0)
MCH: 31.7 pg (ref 26.0–34.0)
MCHC: 34 g/dL (ref 30.0–36.0)
MCV: 93.1 fL (ref 78.0–100.0)
PLATELETS: 200 10*3/uL (ref 150–400)
RBC: 4.23 MIL/uL (ref 4.22–5.81)
RDW: 14.8 % (ref 11.5–15.5)
WBC: 7.6 10*3/uL (ref 4.0–10.5)

## 2015-10-02 LAB — HEPARIN LEVEL (UNFRACTIONATED)
HEPARIN UNFRACTIONATED: 0.76 [IU]/mL — AB (ref 0.30–0.70)
HEPARIN UNFRACTIONATED: 0.77 [IU]/mL — AB (ref 0.30–0.70)

## 2015-10-02 LAB — T3, FREE: T3, Free: 3.8 pg/mL (ref 2.0–4.4)

## 2015-10-02 MED ORDER — METOPROLOL TARTRATE 12.5 MG HALF TABLET
12.5000 mg | ORAL_TABLET | Freq: Once | ORAL | Status: AC
  Administered 2015-10-02: 12.5 mg via ORAL
  Filled 2015-10-02: qty 1

## 2015-10-02 MED ORDER — SPIRONOLACTONE 25 MG PO TABS
12.5000 mg | ORAL_TABLET | Freq: Two times a day (BID) | ORAL | Status: DC
  Administered 2015-10-02 – 2015-10-05 (×6): 12.5 mg via ORAL
  Filled 2015-10-02 (×6): qty 1

## 2015-10-02 MED ORDER — METOPROLOL TARTRATE 25 MG PO TABS: 25.0000 mg | ORAL_TABLET | Freq: Two times a day (BID) | ORAL | Status: DC

## 2015-10-02 MED ORDER — APIXABAN 5 MG PO TABS
5.0000 mg | ORAL_TABLET | Freq: Two times a day (BID) | ORAL | Status: DC
  Administered 2015-10-02 – 2015-10-07 (×11): 5 mg via ORAL
  Filled 2015-10-02 (×11): qty 1

## 2015-10-02 MED ORDER — INSULIN GLARGINE 100 UNIT/ML ~~LOC~~ SOLN
5.0000 [IU] | Freq: Every day | SUBCUTANEOUS | Status: DC
  Administered 2015-10-02: 5 [IU] via SUBCUTANEOUS
  Filled 2015-10-02: qty 0.05

## 2015-10-02 MED ORDER — INSULIN ASPART 100 UNIT/ML ~~LOC~~ SOLN: 5.0000 [IU] | Freq: Three times a day (TID) | SUBCUTANEOUS | Status: DC

## 2015-10-02 MED ORDER — CARVEDILOL 6.25 MG PO TABS
6.2500 mg | ORAL_TABLET | Freq: Two times a day (BID) | ORAL | Status: DC
  Administered 2015-10-02 – 2015-10-03 (×2): 6.25 mg via ORAL
  Filled 2015-10-02 (×2): qty 1

## 2015-10-02 NOTE — Progress Notes (Addendum)
Patient Name: Jordan Rowe Date of Encounter: 10/02/2015   SUBJECTIVE  Feeling well. No chest pain, sob or palpitations. Rate was in 140-160s this morning, given morning dose of metoprolol--> rate improved now to 80-100s.   CURRENT MEDS  . apixaban  5 mg Oral BID  . aspirin  324 mg Oral Once  . brimonidine  1 drop Both Eyes BID  . cholecalciferol  1,000 Units Oral q morning - 10a  . cycloSPORINE  1 drop Both Eyes BID  . diltiazem  30 mg Oral 4 times per day  . docusate sodium  100 mg Oral BID  . ferrous sulfate  325 mg Oral Q breakfast  . finasteride  5 mg Oral QPM  . furosemide  40 mg Oral q morning - 10a  . insulin aspart  0-9 Units Subcutaneous TID WC  . insulin aspart  5 Units Subcutaneous TID WC  . insulin glargine  5 Units Subcutaneous QHS  . isosorbide mononitrate  20 mg Oral BID  . latanoprost  1 drop Both Eyes QHS  . Linaclotide  145 mcg Oral q morning - 10a  . lisinopril  10 mg Oral q morning - 10a  . metoprolol tartrate  25 mg Oral BID  . pantoprazole  40 mg Oral Daily  . risperiDONE  2 mg Oral QHS  . senna  2 tablet Oral QHS     OBJECTIVE  Filed Vitals:   10/02/15 0537 10/02/15 0617 10/02/15 0758 10/02/15 0850  BP: 117/94 122/90 169/102 151/97  Pulse: 67  142   Temp:      TempSrc:      Resp: 23     Height:      Weight:      SpO2: 97%       Intake/Output Summary (Last 24 hours) at 10/02/15 0854 Last data filed at 10/02/15 0500  Gross per 24 hour  Intake    240 ml  Output    900 ml  Net   -660 ml   Filed Weights   09/30/15 2311 10/01/15 0423 10/02/15 0003  Weight: 202 lb 8 oz (91.853 kg) 203 lb 3.2 oz (92.171 kg) 200 lb 11.2 oz (91.037 kg)    PHYSICAL EXAM  General: Pleasant, NAD. Neuro: Alert and oriented X 3. Moves all extremities spontaneously. Psych: Normal affect. HEENT:  Normal  Neck: Supple without bruits or JVD. Lungs:  Resp regular and unlabored, CTA. Heart: Ir IR no s3, s4, or murmurs. Abdomen: Soft, non-tender,  non-distended, BS + x 4.  Extremities: No clubbing, cyanosis or edema. DP/PT/Radials 2+ and equal bilaterally.  Accessory Clinical Findings  CBC  Recent Labs  10/01/15 0526 10/02/15 0046  WBC 8.0 7.6  NEUTROABS 3.8  --   HGB 13.6 13.4  HCT 40.0 39.4  MCV 93.2 93.1  PLT 202 200   Basic Metabolic Panel  Recent Labs  09/30/15 1850 10/01/15 0526  NA 145 144  K 3.8 3.6  CL 106 109  CO2 26 28  GLUCOSE 83 102*  BUN 9 10  CREATININE 1.05 1.10  CALCIUM 9.2 8.5*  MG 2.0  --   PHOS 3.5  --    Liver Function Tests  Recent Labs  10/01/15 0526  AST 22  ALT 45  ALKPHOS 46  BILITOT 1.0  PROT 5.6*  ALBUMIN 3.2*   No results for input(s): LIPASE, AMYLASE in the last 72 hours. Cardiac Enzymes  Recent Labs  10/01/15 0004 10/01/15 0526 10/01/15 1144  TROPONINI <0.03 <0.03 <  0.03   BNP Invalid input(s): POCBNP D-Dimer  Recent Labs  10/01/15 1144  DDIMER 0.51*   Thyroid Function Tests  Recent Labs  09/30/15 2056 10/01/15 0526  TSH 2.092  --   T3FREE  --  3.8    TELE  Afib  Echo  LV EF: 20% -  25%  ------------------------------------------------------------------- Indications:   Atrial fibrillation - 427.31.  ------------------------------------------------------------------- History:  Risk factors: Hypertension. Diabetes mellitus.  ------------------------------------------------------------------- Study Conclusions  - Left ventricle: The cavity size was mildly dilated. Wall thickness was normal. Systolic function was severely reduced. The estimated ejection fraction was in the range of 20% to 25%. Diffuse hypokinesis. Features are consistent with a pseudonormal left ventricular filling pattern, with concomitant abnormal relaxation and increased filling pressure (grade 2 diastolic dysfunction). - Mitral valve: There was mild regurgitation. - Left atrium: The atrium was mildly dilated. - Right ventricle: Systolic function  was mildly to moderately reduced. - Right atrium: The atrium was mildly dilated.  Impressions:  - EF is markedly reduced when compared to prior study from 2014 (60%)   Radiology/Studies  Dg Chest 2 View  09/30/2015  CLINICAL DATA:  Chest pain EXAM: CHEST  2 VIEW COMPARISON:  09/14/2013 chest radiograph. FINDINGS: Stable cardiomediastinal silhouette with mild cardiomegaly. No pneumothorax. Trace left pleural effusion. Mild pulmonary edema. No acute consolidative airspace disease. IMPRESSION: Mild cardiomegaly and mild pulmonary edema, in keeping with mild congestive heart failure. Trace left pleural effusion. Electronically Signed   By: Delbert Phenix M.D.   On: 09/30/2015 20:03   Ct Angio Chest Pe W/cm &/or Wo Cm  10/01/2015  CLINICAL DATA:  Chest pain, shortness of breath, elevated D-dimer. EXAM: CT ANGIOGRAPHY CHEST WITH CONTRAST TECHNIQUE: Multidetector CT imaging of the chest was performed using the standard protocol during bolus administration of intravenous contrast. Multiplanar CT image reconstructions and MIPs were obtained to evaluate the vascular anatomy. CONTRAST:  100 cc Omnipaque 350. COMPARISON:  Chest radiograph 09/30/2015 FINDINGS: Mediastinum/Lymph Nodes: No pulmonary emboli or thoracic aortic dissection identified. There is right hilar lymphadenopathy measuring 1.9 mm. There is less prominent left hilar peribronchial thickening likely also representing lymphadenopathy. There is bilateral lower lobe peribronchial thickening. No evidence of mediastinal lymphadenopathy. The heart is enlarged. There is reflux of contrast to the hepatic veins, suggestive of right heart failure. Lungs/Pleura: Evaluation of the lungs limited by motion artifact. There is moderate right and smaller left pleural effusion, with associated compressive subsegmental atelectasis of the lung bases. Upper abdomen: No acute findings. Musculoskeletal: No chest wall mass or suspicious bone lesions identified. There is  bilateral gynecomastia. Review of the MIP images confirms the above findings. IMPRESSION: No evidence of significant pulmonary embolus, accounting for motion artifact. Right hilar lymphadenopathy. Less prominent left hilar lymphadenopathy. In the absence of evidence of malignancy this likely represents reactive lymphadenopathy. Bilateral lower lobe peribronchial thickening, likely inflammatory. Bilateral pleural effusions, right greater than left. Cardiomegaly, with evidence of right heart failure. Gynecomastia. Electronically Signed   By: Ted Mcalpine M.D.   On: 10/01/2015 17:25    ASSESSMENT AND PLAN  1. Atrial fibrillation with RVR (HCC) - CHADs VASc= 3 for age, HTN, DM. Off IV dilt. His rate was up this morning to 140-160s. Improved with morning dose or metoprolol 12.5mg . He is also on Cardizem  q 6 hours. Rate elevates with minimal movement in bed. Needs better rate controlled. Consider TEE/DCCV. Will give extra 12.5mg  now and increase to metoprolol  BID.  - Echo showed LV Ef of 20-25%, diffuse  hypokinesis, grade 2 DD, mild MR, mild dilated LA and RA. His EF was 60-65% on echo 01/2013.  - currently on IV heparin.   2. Acute systolic CHF - Echo as above. severely reduced EF compared to prior study. Unable to get good hx due to metal retardation. Diuresed negative .  - Continue BB, ACE, imdur. Will discuss plan with MD for ischemic evaluation.   3. Elevated d-dimer - CTA showed no PE with lymphadenopathy. Bilateral pleural effusion R>L.   4. HTN - Elevated at time.     MENTAL RETARDATION   Diabetes mellitus type 2, controlled (HCC)   Schizophrenia (HCC)   Smoker     Signed, Bhagat,Bhavinkumar PA-C Pager [phone] .  Patient seen and examined. Agree with assessment and plan. I/O -660 since admission. AF with previous rapid ventricular response, now improved rate control in the 80s.  ECG yesterday revealed atrial fibrillation at 150 bpm.  Q waves are present  inferiorly.  Will repeat ECG today. Echo data reviewed.  There is diffuse global hypokinesis with an ejection fraction of 20-25% with grade 2 diastolic dysfunction.  There is mild biatrial enlargement.  His exam does not reveal overt CHF.  With his LV dysfunction, I will change low-dose metoprolol to carvedilol and titrate as BP and HR allows; continue ACE inhibitor with lisinopril and initiate therapy with spironolactone for aldosterone blockade.   Eliquis was started yesterday for NOAC therapy. No bleeding. Will check BNP.  He ultimately will be scheduled for a noninvasive ischemic evaluation.  Lennette Bihari, MD, [facility] 10/02/2015 10:32 AM

## 2015-10-02 NOTE — Plan of Care (Signed)
Problem: Activity: Goal: Ability to tolerate increased activity will improve Outcome: Progressing Pt was able to get out of bed and sit in his chair for almost an hour, Heart rate increased minimally with activity. Pt in no distress.

## 2015-10-02 NOTE — Clinical Documentation Improvement (Signed)
Internal Medicine  Can the diagnosis of Atrial Fibrillation be further specified? Please document findings in next progress note NOT in BPA drop down box.    Chronic Atrial fibrillation  Paroxysmal Atrial fibrillation  Permanent Atrial fibrillation  Persistent Atrial fibrillation  Other  Clinically Undetermined  Document any associated diagnoses/conditions.  Supporting Information:  Please exercise your independent, professional judgment when responding. A specific answer is not anticipated or expected.  Thank You,  Shellee Milo RN, BSN, [facility] Health Information Management [place] [phone]

## 2015-10-02 NOTE — Progress Notes (Signed)
Patient during routine spiritual care rounds, and to complete a Spiritual Care Consult.  Chaplain,introduced self, and  inquired if patient was doing better since his admission to hospital, and he replies that he is.  Informed him that Chaplains make regular visits to patients to assure them that they are being spiritually supported through out their admission in the hospital .  He states he does have a faith community that supports him as well. He was appreciative of the visit. Chaplain Janell Quiet [phone]

## 2015-10-02 NOTE — Discharge Instructions (Signed)

## 2015-10-02 NOTE — Progress Notes (Signed)
ANTICOAGULATION CONSULT NOTE - Consult  Pharmacy Consult for Heparin Indication: atrial fibrillation  No Known Allergies  Patient Measurements: Height: 5\' 9"  (175.3 cm) Weight: 200 lb 11.2 oz (91.037 kg) IBW/kg (Calculated) : 70.7  Vital Signs: Temp: 98.2 F (36.8 C) (02/02 0003) BP: 169/102 mmHg (02/02 0758) Pulse Rate: 142 (02/02 0758)  Labs:  Recent Labs  09/30/15 1850 10/01/15 0004  10/01/15 0526 10/01/15 1144 10/01/15 1635 10/02/15 0046 10/02/15 0704  HGB 14.8  --   --  13.6  --   --  13.4  --   HCT 43.9  --   --  40.0  --   --  39.4  --   PLT 221  --   --  202  --   --  200  --   HEPARINUNFRC  --   --   < > 0.84*  --  1.44* 0.77* 0.76*  CREATININE 1.05  --   --  1.10  --   --   --   --   TROPONINI  --  <0.03  --  <0.03 <0.03  --   --   --   < > = values in this interval not displayed.  Estimated Creatinine Clearance: 72.6 mL/min (by C-G formula based on Cr of 1.1).  Scheduled:  . aspirin  324 mg Oral Once  . brimonidine  1 drop Both Eyes BID  . cholecalciferol  1,000 Units Oral q morning - 10a  . cycloSPORINE  1 drop Both Eyes BID  . diltiazem  30 mg Oral 4 times per day  . docusate sodium  100 mg Oral BID  . ferrous sulfate  325 mg Oral Q breakfast  . finasteride  5 mg Oral QPM  . furosemide  40 mg Oral q morning - 10a  . insulin aspart  0-9 Units Subcutaneous TID WC  . insulin aspart  10 Units Subcutaneous TID WC  . insulin glargine  28 Units Subcutaneous QHS  . isosorbide mononitrate  20 mg Oral BID  . latanoprost  1 drop Both Eyes QHS  . Linaclotide  145 mcg Oral q morning - 10a  . lisinopril  10 mg Oral q morning - 10a  . metoprolol tartrate  12.5 mg Oral BID  . pantoprazole  40 mg Oral Daily  . risperiDONE  2 mg Oral QHS  . senna  2 tablet Oral QHS   Infusions:  . heparin 1,000 Units/hr (10/02/15 0115)   Heparin Dosing Weight: 89.4 kg  Assessment: 70yo male with history of mental retardation presents from SNF with substernal CP. Pharmacy  is consulted to dose heparin for Afib. CBC stable, no s/s of bleed.  Goal of Therapy:  Heparin level 0.3-0.7 units/ml Monitor platelets by anticoagulation protocol: Yes   Plan:  Decrease heparin infusion to 900 units/hr Check 6 hr HL Monitor daily HL, CBC, s/s of bleed  Thank you for allowing [place] to participate in this patients care. Signe Colt, PharmD Pager: [phone]  10/02/2015 8:27 AM

## 2015-10-02 NOTE — Progress Notes (Signed)
Pt HR 140's in afib.  Gave pt 1000 dose of p.o. Metoprolol and notfied PA on call. Will continue to closely monitor.

## 2015-10-02 NOTE — Progress Notes (Signed)
[facility] PROGRESS NOTE  Jordan Rowe ZOX:[id number] DOB: [birth year] DOA: 09/30/2015 PCP: Pcp Not In System  Assessment/Plan:  Principal Problem:   Atrial fibrillation with RVR (HCC): duration unknown. Rate still with periods of RVR on po cardizem. Cardiology has added carvedilol. Now on eliquis. CHADSVASC 3. CTA chest without PE. Active Problems: Systolic dysfunction: echo with EF 20-25%. Already on ACE I, BB. No CHF on exam. Started on spironolactone   MENTAL RETARDATION: lives in ALF   Essential hypertension: controlled.    Chest pain: MI ruled out. CTA chest neg for PE   Diabetes mellitus type 2, controlled (HCC): hypoglycemic this am. Will decrease lantus and humalog dosing   Schizophrenia (HCC): has been cooperative and appropriate   Smoker   Code Status:  full Family Communication:   Disposition Plan:  Eventually, back to ALF   HPI/Subjective: No dyspnea. No CP today.  Objective: Filed Vitals:   10/02/15 1146 10/02/15 1150  BP: 112/85   Pulse:    Temp:  98.2 F (36.8 C)  Resp:      Intake/Output Summary (Last 24 hours) at 10/02/15 1253 Last data filed at 10/02/15 0500  Gross per 24 hour  Intake    240 ml  Output    600 ml  Net   -360 ml   Filed Weights   09/30/15 2311 10/01/15 0423 10/02/15 0003  Weight: 91.853 kg (202 lb 8 oz) 92.171 kg (203 lb 3.2 oz) 91.037 kg (200 lb 11.2 oz)   Tele: a fib  Exam:   General:  Cooperative. Alert and appropriate. In chair  Cardiovascular: irreg irreg without MGR  Respiratory: CTA without WRR  Abdomen: S, NT, ND  Ext: no CCE  Basic Metabolic Panel:  Recent Labs Lab 09/30/15 1850 10/01/15 0526  NA 145 144  K 3.8 3.6  CL 106 109  CO2 26 28  GLUCOSE 83 102*  BUN 9 10  CREATININE 1.05 1.10  CALCIUM 9.2 8.5*  MG 2.0  --   PHOS 3.5  --    Liver Function Tests:  Recent Labs Lab 10/01/15 0526  AST 22  ALT 45  ALKPHOS 46  BILITOT 1.0  PROT 5.6*  ALBUMIN 3.2*   No results for  input(s): LIPASE, AMYLASE in the last 168 hours. No results for input(s): AMMONIA in the last 168 hours. CBC:  Recent Labs Lab 09/30/15 1850 10/01/15 0526 10/02/15 0046  WBC 7.2 8.0 7.6  NEUTROABS  --  3.8  --   HGB 14.8 13.6 13.4  HCT 43.9 40.0 39.4  MCV 93.4 93.2 93.1  PLT 221 202 200   Cardiac Enzymes:  Recent Labs Lab 10/01/15 0004 10/01/15 0526 10/01/15 1144  TROPONINI <0.03 <0.03 <0.03   BNP (last 3 results)  Recent Labs  09/30/15 2056  BNP 201.2*    ProBNP (last 3 results) No results for input(s): PROBNP in the last 8760 hours.  CBG:  Recent Labs Lab 10/01/15 1657 10/01/15 2035 10/02/15 0748 10/02/15 0850 10/02/15 1121  GLUCAP 70 104* 59* 130* 114*    Recent Results (from the past 240 hour(s))  MRSA PCR Screening     Status: None   Collection Time: 09/30/15 11:22 PM  Result Value Ref Range Status   MRSA by PCR NEGATIVE NEGATIVE Final    Comment:        The GeneXpert MRSA Assay (FDA approved for NASAL specimens only), is one component of a comprehensive MRSA colonization surveillance program. It is not intended to diagnose MRSA infection  nor to guide or monitor treatment for MRSA infections.      Studies: Dg Chest 2 View  09/30/2015  CLINICAL DATA:  Chest pain EXAM: CHEST  2 VIEW COMPARISON:  09/14/2013 chest radiograph. FINDINGS: Stable cardiomediastinal silhouette with mild cardiomegaly. No pneumothorax. Trace left pleural effusion. Mild pulmonary edema. No acute consolidative airspace disease. IMPRESSION: Mild cardiomegaly and mild pulmonary edema, in keeping with mild congestive heart failure. Trace left pleural effusion. Electronically Signed   By: Delbert Phenix M.D.   On: 09/30/2015 20:03   Ct Angio Chest Pe W/cm &/or Wo Cm  10/01/2015  CLINICAL DATA:  Chest pain, shortness of breath, elevated D-dimer. EXAM: CT ANGIOGRAPHY CHEST WITH CONTRAST TECHNIQUE: Multidetector CT imaging of the chest was performed using the standard protocol  during bolus administration of intravenous contrast. Multiplanar CT image reconstructions and MIPs were obtained to evaluate the vascular anatomy. CONTRAST:  100 cc Omnipaque 350. COMPARISON:  Chest radiograph 09/30/2015 FINDINGS: Mediastinum/Lymph Nodes: No pulmonary emboli or thoracic aortic dissection identified. There is right hilar lymphadenopathy measuring 1.9 mm. There is less prominent left hilar peribronchial thickening likely also representing lymphadenopathy. There is bilateral lower lobe peribronchial thickening. No evidence of mediastinal lymphadenopathy. The heart is enlarged. There is reflux of contrast to the hepatic veins, suggestive of right heart failure. Lungs/Pleura: Evaluation of the lungs limited by motion artifact. There is moderate right and smaller left pleural effusion, with associated compressive subsegmental atelectasis of the lung bases. Upper abdomen: No acute findings. Musculoskeletal: No chest wall mass or suspicious bone lesions identified. There is bilateral gynecomastia. Review of the MIP images confirms the above findings. IMPRESSION: No evidence of significant pulmonary embolus, accounting for motion artifact. Right hilar lymphadenopathy. Less prominent left hilar lymphadenopathy. In the absence of evidence of malignancy this likely represents reactive lymphadenopathy. Bilateral lower lobe peribronchial thickening, likely inflammatory. Bilateral pleural effusions, right greater than left. Cardiomegaly, with evidence of right heart failure. Gynecomastia. Electronically Signed   By: Ted Mcalpine M.D.   On: 10/01/2015 17:25   Echo Left ventricle: The cavity size was mildly dilated. Wall thickness was normal. Systolic function was severely reduced. The estimated ejection fraction was in the range of 20% to 25%. Diffuse hypokinesis. Features are consistent with a pseudonormal left ventricular filling pattern, with concomitant abnormal relaxation and increased  filling pressure (grade 2 diastolic dysfunction). - Mitral valve: There was mild regurgitation. - Left atrium: The atrium was mildly dilated. - Right ventricle: Systolic function was mildly to moderately reduced. - Right atrium: The atrium was mildly dilated.  Impressions:  - EF is markedly reduced when compared to prior study from 2014  Scheduled Meds: . apixaban  5 mg Oral BID  . aspirin  324 mg Oral Once  . brimonidine  1 drop Both Eyes BID  . carvedilol  6.25 mg Oral BID WC  . cholecalciferol  1,000 Units Oral q morning - 10a  . cycloSPORINE  1 drop Both Eyes BID  . diltiazem  30 mg Oral 4 times per day  . docusate sodium  100 mg Oral BID  . ferrous sulfate  325 mg Oral Q breakfast  . finasteride  5 mg Oral QPM  . insulin aspart  0-9 Units Subcutaneous TID WC  . insulin aspart  5 Units Subcutaneous TID WC  . insulin glargine  5 Units Subcutaneous QHS  . isosorbide mononitrate  20 mg Oral BID  . latanoprost  1 drop Both Eyes QHS  . Linaclotide  145 mcg Oral q  morning - 10a  . lisinopril  10 mg Oral q morning - 10a  . pantoprazole  40 mg Oral Daily  . risperiDONE  2 mg Oral QHS  . senna  2 tablet Oral QHS  . spironolactone  12.5 mg Oral BID   Continuous Infusions:    Time spent: 25 minutes  Jaideep Pollack L  [facility] [email or web address], password [facility] 10/02/2015, 12:53 PM  LOS: 2 days

## 2015-10-02 NOTE — Progress Notes (Signed)
ANTICOAGULATION CONSULT NOTE - Follow Up Consult  Pharmacy Consult for heparin Indication: atrial fibrillation   Labs:  Recent Labs  09/30/15 1850 10/01/15 0004 10/01/15 0526 10/01/15 1144 10/01/15 1635 10/02/15 0046  HGB 14.8  --  13.6  --   --  13.4  HCT 43.9  --  40.0  --   --  39.4  PLT 221  --  202  --   --  200  HEPARINUNFRC  --   --  0.84*  --  1.44* 0.77*  CREATININE 1.05  --  1.10  --   --   --   TROPONINI  --  <0.03 <0.03 <0.03  --   --      Assessment: 70yo male remains supratherapeutic on heparin after rate change though closer to goal.  Goal of Therapy:  Heparin level 0.3-0.7 units/ml   Plan:  Will decrease heparin gtt by 1 unit/kg/hr to 1000 units/hr and check level in 6hr.  Vernard Gambles, PharmD, BCPS  10/02/2015,1:06 AM

## 2015-10-03 LAB — GLUCOSE, CAPILLARY
GLUCOSE-CAPILLARY: 68 mg/dL (ref 65–99)
GLUCOSE-CAPILLARY: 122 mg/dL — AB (ref 65–99)
GLUCOSE-CAPILLARY: 138 mg/dL — AB (ref 65–99)
Glucose-Capillary: 99 mg/dL (ref 65–99)

## 2015-10-03 LAB — BASIC METABOLIC PANEL
ANION GAP: 9 (ref 5–15)
BUN: 9 mg/dL (ref 6–20)
CALCIUM: 8.5 mg/dL — AB (ref 8.9–10.3)
CO2: 25 mmol/L (ref 22–32)
Chloride: 107 mmol/L (ref 101–111)
Creatinine, Ser: 0.96 mg/dL (ref 0.61–1.24)
GLUCOSE: 71 mg/dL (ref 65–99)
Potassium: 3.7 mmol/L (ref 3.5–5.1)
SODIUM: 141 mmol/L (ref 135–145)

## 2015-10-03 LAB — BRAIN NATRIURETIC PEPTIDE: B NATRIURETIC PEPTIDE 5: 295.5 pg/mL — AB (ref 0.0–100.0)

## 2015-10-03 LAB — MAGNESIUM: MAGNESIUM: 2 mg/dL (ref 1.7–2.4)

## 2015-10-03 MED ORDER — FUROSEMIDE 40 MG PO TABS
40.0000 mg | ORAL_TABLET | Freq: Every day | ORAL | Status: DC
  Administered 2015-10-03 – 2015-10-07 (×5): 40 mg via ORAL
  Filled 2015-10-03 (×5): qty 1

## 2015-10-03 MED ORDER — INSULIN GLARGINE 100 UNIT/ML ~~LOC~~ SOLN
3.0000 [IU] | Freq: Every day | SUBCUTANEOUS | Status: DC
  Administered 2015-10-03 – 2015-10-06 (×4): 3 [IU] via SUBCUTANEOUS
  Filled 2015-10-03 (×6): qty 0.03

## 2015-10-03 MED ORDER — CARVEDILOL 12.5 MG PO TABS
12.5000 mg | ORAL_TABLET | Freq: Two times a day (BID) | ORAL | Status: DC
  Administered 2015-10-03 – 2015-10-04 (×2): 12.5 mg via ORAL
  Filled 2015-10-03 (×2): qty 1

## 2015-10-03 MED ORDER — INSULIN ASPART 100 UNIT/ML ~~LOC~~ SOLN
3.0000 [IU] | Freq: Three times a day (TID) | SUBCUTANEOUS | Status: DC
  Administered 2015-10-03 – 2015-10-06 (×6): 3 [IU] via SUBCUTANEOUS

## 2015-10-03 MED ORDER — POTASSIUM CHLORIDE CRYS ER 20 MEQ PO TBCR
40.0000 meq | EXTENDED_RELEASE_TABLET | Freq: Once | ORAL | Status: AC
  Administered 2015-10-03: 40 meq via ORAL
  Filled 2015-10-03: qty 2

## 2015-10-03 NOTE — Progress Notes (Signed)
[facility] PROGRESS NOTE  Jordan Rowe WUJ:[id number] DOB: 04-Oct-1945 DOA: 09/30/2015 PCP: Pcp Not In System  Assessment/Plan:  Principal Problem:   Atrial fibrillation with RVR (HCC): duration unknown. Cardiology increasing coreg. Now on eliquis. CHADSVASC 3.  Active Problems: Systolic dysfunction: echo with EF 20-25%. Already on ACE I, BB. No CHF on exam. Started on spironolactone. Euvolemic. Per Social work, Patient will be unable to go back to ALF over the weekend, so may be a good time to do perfusion study. Discussed with Mr. Lisabeth Devoid.   MENTAL RETARDATION: lives in ALF   Essential hypertension: increasing coreg   Chest pain: MI ruled out. CTA chest neg for PE   Diabetes mellitus type 2, controlled (HCC): hypoglycemic again this am. Decreasing insulin further   Schizophrenia [facility]): has been cooperative and appropriate   Smoker   Code Status:  full Family Communication:   Disposition Plan:  Eventually, back to ALF   HPI/Subjective: No dyspnea. No CP. No complaints  Objective: Filed Vitals:   10/03/15 0837 10/03/15 1133  BP:  143/104  Pulse:    Temp: 97.7 F (36.5 C)   Resp:      Intake/Output Summary (Last 24 hours) at 10/03/15 1302 Last data filed at 10/03/15 1108  Gross per 24 hour  Intake    360 ml  Output   1720 ml  Net  -1360 ml   Filed Weights   10/01/15 0423 10/02/15 0003 10/03/15 0600  Weight: 92.171 kg (203 lb 3.2 oz) 91.037 kg (200 lb 11.2 oz) 92.08 kg (203 lb)   Tele: a fib  Exam:   General:  Cooperative. Alert and appropriate. Lying flat in bed  Cardiovascular: irreg irreg without MGR  Respiratory: CTA without WRR  Abdomen: S, NT, ND  Ext: no CCE  Basic Metabolic Panel:  Recent Labs Lab 09/30/15 1850 10/01/15 0526 10/03/15 0037 10/03/15 0420  NA 145 144  --  141  K 3.8 3.6  --  3.7  CL 106 109  --  107  CO2 26 28  --  25  GLUCOSE 83 102*  --  71  BUN 9 10  --  9  CREATININE 1.05 1.10  --  0.96  CALCIUM 9.2 8.5*  --   8.5*  MG 2.0  --  2.0  --   PHOS 3.5  --   --   --    Liver Function Tests:  Recent Labs Lab 10/01/15 0526  AST 22  ALT 45  ALKPHOS 46  BILITOT 1.0  PROT 5.6*  ALBUMIN 3.2*   No results for input(s): LIPASE, AMYLASE in the last 168 hours. No results for input(s): AMMONIA in the last 168 hours. CBC:  Recent Labs Lab 09/30/15 1850 10/01/15 0526 10/02/15 0046  WBC 7.2 8.0 7.6  NEUTROABS  --  3.8  --   HGB 14.8 13.6 13.4  HCT 43.9 40.0 39.4  MCV 93.4 93.2 93.1  PLT 221 202 200   Cardiac Enzymes:  Recent Labs Lab 10/01/15 0004 10/01/15 0526 10/01/15 1144  TROPONINI <0.03 <0.03 <0.03   BNP (last 3 results)  Recent Labs  09/30/15 2056 10/03/15 0420  BNP 201.2* 295.5*    ProBNP (last 3 results) No results for input(s): PROBNP in the last 8760 hours.  CBG:  Recent Labs Lab 10/02/15 1121 10/02/15 1620 10/02/15 2226 10/03/15 0741 10/03/15 1144  GLUCAP 114* 108* 107* 68 122*    Recent Results (from the past 240 hour(s))  MRSA PCR Screening  Status: None   Collection Time: 09/30/15 11:22 PM  Result Value Ref Range Status   MRSA by PCR NEGATIVE NEGATIVE Final    Comment:        The GeneXpert MRSA Assay (FDA approved for NASAL specimens only), is one component of a comprehensive MRSA colonization surveillance program. It is not intended to diagnose MRSA infection nor to guide or monitor treatment for MRSA infections.      Studies: Ct Angio Chest Pe W/cm &/or Wo Cm  10/01/2015  CLINICAL DATA:  Chest pain, shortness of breath, elevated D-dimer. EXAM: CT ANGIOGRAPHY CHEST WITH CONTRAST TECHNIQUE: Multidetector CT imaging of the chest was performed using the standard protocol during bolus administration of intravenous contrast. Multiplanar CT image reconstructions and MIPs were obtained to evaluate the vascular anatomy. CONTRAST:  100 cc Omnipaque 350. COMPARISON:  Chest radiograph 09/30/2015 FINDINGS: Mediastinum/Lymph Nodes: No pulmonary emboli or  thoracic aortic dissection identified. There is right hilar lymphadenopathy measuring 1.9 mm. There is less prominent left hilar peribronchial thickening likely also representing lymphadenopathy. There is bilateral lower lobe peribronchial thickening. No evidence of mediastinal lymphadenopathy. The heart is enlarged. There is reflux of contrast to the hepatic veins, suggestive of right heart failure. Lungs/Pleura: Evaluation of the lungs limited by motion artifact. There is moderate right and smaller left pleural effusion, with associated compressive subsegmental atelectasis of the lung bases. Upper abdomen: No acute findings. Musculoskeletal: No chest wall mass or suspicious bone lesions identified. There is bilateral gynecomastia. Review of the MIP images confirms the above findings. IMPRESSION: No evidence of significant pulmonary embolus, accounting for motion artifact. Right hilar lymphadenopathy. Less prominent left hilar lymphadenopathy. In the absence of evidence of malignancy this likely represents reactive lymphadenopathy. Bilateral lower lobe peribronchial thickening, likely inflammatory. Bilateral pleural effusions, right greater than left. Cardiomegaly, with evidence of right heart failure. Gynecomastia. Electronically Signed   By: Ted Mcalpine M.D.   On: 10/01/2015 17:25   Echo Left ventricle: The cavity size was mildly dilated. Wall thickness was normal. Systolic function was severely reduced. The estimated ejection fraction was in the range of 20% to 25%. Diffuse hypokinesis. Features are consistent with a pseudonormal left ventricular filling pattern, with concomitant abnormal relaxation and increased filling pressure (grade 2 diastolic dysfunction). - Mitral valve: There was mild regurgitation. - Left atrium: The atrium was mildly dilated. - Right ventricle: Systolic function was mildly to moderately reduced. - Right atrium: The atrium was mildly  dilated.  Impressions:  - EF is markedly reduced when compared to prior study from 2014  Scheduled Meds: . apixaban  5 mg Oral BID  . aspirin  324 mg Oral Once  . brimonidine  1 drop Both Eyes BID  . carvedilol  12.5 mg Oral BID WC  . cholecalciferol  1,000 Units Oral q morning - 10a  . cycloSPORINE  1 drop Both Eyes BID  . diltiazem  30 mg Oral 4 times per day  . docusate sodium  100 mg Oral BID  . ferrous sulfate  325 mg Oral Q breakfast  . finasteride  5 mg Oral QPM  . insulin aspart  0-9 Units Subcutaneous TID WC  . insulin aspart  3 Units Subcutaneous TID WC  . insulin glargine  3 Units Subcutaneous QHS  . isosorbide mononitrate  20 mg Oral BID  . latanoprost  1 drop Both Eyes QHS  . Linaclotide  145 mcg Oral q morning - 10a  . lisinopril  10 mg Oral q morning - 10a  .  pantoprazole  40 mg Oral Daily  . risperiDONE  2 mg Oral QHS  . senna  2 tablet Oral QHS  . spironolactone  12.5 mg Oral BID   Continuous Infusions:    Time spent: 25 minutes  Queen Abbett L  [facility] [email or web address], password [facility] 10/03/2015, 1:02 PM  LOS: 3 days

## 2015-10-03 NOTE — Clinical Social Work Note (Signed)
Spoke with facility [facility]. Facility states they will not be able to accept the patient back this weekend if they are not aware of the DC medications today. CSW has explained to facility that we are unable to provide definite list of DC medications at this time.    Roddie Mc MSW, [place], [place], [phone]

## 2015-10-03 NOTE — Progress Notes (Signed)
Patient had 15 beats SVT; strip saved in EPIC. Patient is currently sleeping. Notified Claiborne Billings NP on call for [facility]. Will continue to monitor patient.

## 2015-10-03 NOTE — Plan of Care (Signed)
Problem: Cardiac: Goal: Ability to achieve and maintain adequate cardiopulmonary perfusion will improve Outcome: Progressing Patient has maintained his heart rate below 100 for the majority of the shift. Patient's heart rate did increase to the 120s when he was using the bedside commode. Patient is still in atrial fibrillation; he is being treated with metoprolol and cardizem for rate control and eliquis for anticoagulation.

## 2015-10-03 NOTE — Progress Notes (Signed)
Patient Name: Jordan Rowe Date of Encounter: 10/03/2015     Principal Problem:   Atrial fibrillation with RVR [facility]) Active Problems:   MENTAL RETARDATION   Essential hypertension   Chest pain   Diabetes mellitus type 2, controlled (HCC)   Schizophrenia (HCC)   Smoker    SUBJECTIVE  Denies any CP or SOB.   CURRENT MEDS . apixaban  5 mg Oral BID  . aspirin  324 mg Oral Once  . brimonidine  1 drop Both Eyes BID  . carvedilol  6.25 mg Oral BID WC  . cholecalciferol  1,000 Units Oral q morning - 10a  . cycloSPORINE  1 drop Both Eyes BID  . diltiazem  30 mg Oral 4 times per day  . docusate sodium  100 mg Oral BID  . ferrous sulfate  325 mg Oral Q breakfast  . finasteride  5 mg Oral QPM  . insulin aspart  0-9 Units Subcutaneous TID WC  . insulin aspart  3 Units Subcutaneous TID WC  . insulin glargine  3 Units Subcutaneous QHS  . isosorbide mononitrate  20 mg Oral BID  . latanoprost  1 drop Both Eyes QHS  . Linaclotide  145 mcg Oral q morning - 10a  . lisinopril  10 mg Oral q morning - 10a  . pantoprazole  40 mg Oral Daily  . risperiDONE  2 mg Oral QHS  . senna  2 tablet Oral QHS  . spironolactone  12.5 mg Oral BID    OBJECTIVE  Filed Vitals:   10/03/15 0600 10/03/15 0832 10/03/15 0837 10/03/15 1133  BP:  143/113  143/104  Pulse:  114    Temp:   97.7 F (36.5 C)   TempSrc:   Axillary   Resp:      Height:      Weight: 203 lb (92.08 kg)     SpO2:        Intake/Output Summary (Last 24 hours) at 10/03/15 1148 Last data filed at 10/03/15 1108  Gross per 24 hour  Intake    360 ml  Output   1720 ml  Net  -1360 ml   Filed Weights   10/01/15 0423 10/02/15 0003 10/03/15 0600  Weight: 203 lb 3.2 oz (92.171 kg) 200 lb 11.2 oz (91.037 kg) 203 lb (92.08 kg)    PHYSICAL EXAM  General: Pleasant, NAD. Neuro: Alert and oriented X 3. Moves all extremities spontaneously. Psych: Normal affect. HEENT:  Normal  Neck: Supple without bruits or JVD. Lungs:  Resp  regular and unlabored, CTA. Heart: RRR no s3, s4, or murmurs. Abdomen: Soft, non-tender, non-distended, BS + x 4.  Extremities: No clubbing, cyanosis or edema. DP/PT/Radials 2+ and equal bilaterally.  Accessory Clinical Findings  CBC  Recent Labs  10/01/15 0526 10/02/15 0046  WBC 8.0 7.6  NEUTROABS 3.8  --   HGB 13.6 13.4  HCT 40.0 39.4  MCV 93.2 93.1  PLT 202 200   Basic Metabolic Panel  Recent Labs  09/30/15 1850 10/01/15 0526 10/03/15 0037 10/03/15 0420  NA 145 144  --  141  K 3.8 3.6  --  3.7  CL 106 109  --  107  CO2 26 28  --  25  GLUCOSE 83 102*  --  71  BUN 9 10  --  9  CREATININE 1.05 1.10  --  0.96  CALCIUM 9.2 8.5*  --  8.5*  MG 2.0  --  2.0  --   PHOS 3.5  --   --   --  Liver Function Tests  Recent Labs  10/01/15 0526  AST 22  ALT 45  ALKPHOS 46  BILITOT 1.0  PROT 5.6*  ALBUMIN 3.2*   Cardiac Enzymes  Recent Labs  10/01/15 0004 10/01/15 0526 10/01/15 1144  TROPONINI <0.03 <0.03 <0.03   BNP Invalid input(s): POCBNP D-Dimer  Recent Labs  10/01/15 1144  DDIMER 0.51*   Thyroid Function Tests  Recent Labs  09/30/15 2056 10/01/15 0526  TSH 2.092  --   T3FREE  --  3.8    TELE afib with HR 80-120s    ECG  No new EKG  Echocardiogram 10/01/2015  LV EF: 20% -  25%  ------------------------------------------------------------------- Indications:   Atrial fibrillation - 427.31.  ------------------------------------------------------------------- History:  Risk factors: Hypertension. Diabetes mellitus.  ------------------------------------------------------------------- Study Conclusions  - Left ventricle: The cavity size was mildly dilated. Wall thickness was normal. Systolic function was severely reduced. The estimated ejection fraction was in the range of 20% to 25%. Diffuse hypokinesis. Features are consistent with a pseudonormal left ventricular filling pattern, with concomitant  abnormal relaxation and increased filling pressure (grade 2 diastolic dysfunction). - Mitral valve: There was mild regurgitation. - Left atrium: The atrium was mildly dilated. - Right ventricle: Systolic function was mildly to moderately reduced. - Right atrium: The atrium was mildly dilated.  Impressions:  - EF is markedly reduced when compared to prior study from 2014 (60%)    Radiology/Studies  Dg Chest 2 View  09/30/2015  CLINICAL DATA:  Chest pain EXAM: CHEST  2 VIEW COMPARISON:  09/14/2013 chest radiograph. FINDINGS: Stable cardiomediastinal silhouette with mild cardiomegaly. No pneumothorax. Trace left pleural effusion. Mild pulmonary edema. No acute consolidative airspace disease. IMPRESSION: Mild cardiomegaly and mild pulmonary edema, in keeping with mild congestive heart failure. Trace left pleural effusion. Electronically Signed   By: Delbert Phenix M.D.   On: 09/30/2015 20:03   Ct Angio Chest Pe W/cm &/or Wo Cm  10/01/2015  CLINICAL DATA:  Chest pain, shortness of breath, elevated D-dimer. EXAM: CT ANGIOGRAPHY CHEST WITH CONTRAST TECHNIQUE: Multidetector CT imaging of the chest was performed using the standard protocol during bolus administration of intravenous contrast. Multiplanar CT image reconstructions and MIPs were obtained to evaluate the vascular anatomy. CONTRAST:  100 cc Omnipaque 350. COMPARISON:  Chest radiograph 09/30/2015 FINDINGS: Mediastinum/Lymph Nodes: No pulmonary emboli or thoracic aortic dissection identified. There is right hilar lymphadenopathy measuring 1.9 mm. There is less prominent left hilar peribronchial thickening likely also representing lymphadenopathy. There is bilateral lower lobe peribronchial thickening. No evidence of mediastinal lymphadenopathy. The heart is enlarged. There is reflux of contrast to the hepatic veins, suggestive of right heart failure. Lungs/Pleura: Evaluation of the lungs limited by motion artifact. There is moderate right  and smaller left pleural effusion, with associated compressive subsegmental atelectasis of the lung bases. Upper abdomen: No acute findings. Musculoskeletal: No chest wall mass or suspicious bone lesions identified. There is bilateral gynecomastia. Review of the MIP images confirms the above findings. IMPRESSION: No evidence of significant pulmonary embolus, accounting for motion artifact. Right hilar lymphadenopathy. Less prominent left hilar lymphadenopathy. In the absence of evidence of malignancy this likely represents reactive lymphadenopathy. Bilateral lower lobe peribronchial thickening, likely inflammatory. Bilateral pleural effusions, right greater than left. Cardiomegaly, with evidence of right heart failure. Gynecomastia. Electronically Signed   By: Ted Mcalpine M.D.   On: 10/01/2015 17:25    ASSESSMENT AND PLAN  1. Atrial fibrillation with RVR  - CHADs VASc= 3 for age, HTN, DM  - continue  eliquis, increase coreg to 12.5mg  BID. Lisinopril 10mg  daily. Started on spironolactone yesterday, CTA of chest showed gynecomastia, however if he complain of breast tenderness, can switch to eplirenone later. His HR is barely controlled, hesitant to stop the short acting diltiazem at this time, eventually, would like to increase his coreg to 25mg  BID and stop diltiazem given LV dysfunction.  May need to cut back on nitrate to create BP room.      2. Newly diagnosed LV dysfunction with acute systolic HF  - Echo 02/03/2013 EF 60-65%, no RWMA  - Echo 10/01/2015 EF 20-25%, grade 2 DD, mild MR  - possible etiology include tachy induced cardiomyopathy vs ischemic cardiomyopathy. Plan for lexiscan myoview which can be done as inpatient or outpatient.   - he is euvolemic on exam today, however given low EF, will discuss with MD to potentially resume his home dose of 40mg  daily lasix.   - given LV dysfunction, will need to continue coreg, ACEI, spironolactone  3. HTN  4. DM  5. Schizophrenia  6. Mental  retardation  7. Elevated d-dimer: CTA negative  Hassan Buckler Pager: [phone]   Patient seen and examined. Agree with assessment and plan.  Patient feels improved.  His rhythm shows atrial fibrillation with rates varying from 80s to 100.  Carvedilol will be increased.  He continues to be on lisinopril 10 mg and spironolactone was added yesterday.  We also resume furosemide today.  Plan is for him to be discharged to skilled nursing facility and apparently they cannot accept him until Monday.  He will ultimately need to be scheduled for noninvasive ischemic evaluation, which possibly can be done over the weekend, depending upon his stability.   Lennette Bihari, MD, [facility] 10/03/2015 3:48 PM

## 2015-10-03 NOTE — NC FL2 (Signed)
[place] MEDICAID FL2 LEVEL OF CARE SCREENING TOOL     IDENTIFICATION  Patient Name: Jordan Rowe Birthdate: [birth year] Sex: male Admission Date (Current Location): 09/30/2015  [facility] and [place] Number:  Producer, television/film/video and Address:  The . [facility], [address] [place], [place] [address]      Provider Number: [id number]  Attending Physician Name and Address:  Christiane Ha, MD  Relative Name and Phone Number:  Aurther Loft    Current Level of Care: Hospital Recommended Level of Care: Assisted Living Facility Prior Approval Number:    Date Approved/Denied:   PASRR Number:    Discharge Plan: Other (Comment) (ALF)    Current Diagnoses: Patient Active Problem List   Diagnosis Date Noted  . Smoker 10/01/2015  . Schizophrenia (HCC)   . New onset a-fib (HCC) 09/30/2015  . Atrial fibrillation with RVR (HCC) 09/30/2015  . Chest pain 09/30/2015  . Diabetes mellitus type 2, controlled (HCC) 09/30/2015  . Atrial fibrillation (HCC) 09/30/2015  . Pain in the chest   . 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 Diabetes mellitus (HCC) 02/02/2013  . Dehydration 02/02/2013  . Acute renal failure (HCC) 02/02/2013  . Hypoglycemia 02/02/2013  . MENTAL RETARDATION 02/12/2009  . Essential hypertension 02/12/2009  . GERD 02/12/2009  . CONSTIPATION 02/12/2009  . CHEST PAIN 02/12/2009  . NAUSEA AND VOMITING 02/12/2009    Orientation RESPIRATION BLADDER Height & Weight     Self, Time, Situation, Place  Normal Incontinent Weight: 92.08 kg (203 lb) Height:   (175.3 cm)  BEHAVIORAL SYMPTOMS/MOOD NEUROLOGICAL BOWEL NUTRITION STATUS   (NONE)  (NONE) Continent Diet (No salt added.)  AMBULATORY STATUS COMMUNICATION OF NEEDS Skin   Supervision Verbally Normal                       Personal Care Assistance Level of Assistance  Bathing, Feeding, Dressing Bathing Assistance: Limited assistance Feeding assistance: Limited assistance Dressing Assistance: Limited  assistance     Functional Limitations Info  Sight, Hearing, Speech Sight Info: Impaired Hearing Info: Impaired Speech Info: Impaired    SPECIAL CARE FACTORS FREQUENCY                       Contractures Contractures Info: Not present    Additional Factors Info  Code Status, Allergies, Psychotropic, Insulin Sliding Scale Code Status Info: Full Allergies Info: NKDA Psychotropic Info: Risperdal Insulin Sliding Scale Info: 3/day       Current Medications (10/03/2015):  This is the current hospital active medication list Current Facility-Administered Medications  Medication Dose Route Frequency Provider Last Rate Last Dose  . acetaminophen (TYLENOL) tablet 650 mg  650 mg Oral Q6H PRN Eduard Clos, MD       Or  . acetaminophen (TYLENOL) suppository 650 mg  650 mg Rectal Q6H PRN Eduard Clos, MD      . apixaban (ELIQUIS) tablet 5 mg  5 mg Oral BID Christiane Ha, MD   5 mg at 10/03/15 1127  . aspirin chewable tablet 324 mg  324 mg Oral Once Marijean Niemann, MD   Stopped at 09/30/15 2029  . brimonidine (ALPHAGAN) 0.15 % ophthalmic solution 1 drop  1 drop Both Eyes BID Eduard Clos, MD   1 drop at 10/03/15 1129  . carvedilol (COREG) tablet 12.5 mg  12.5 mg Oral BID WC Azalee Course, PA      . cholecalciferol (VITAMIN D) tablet 1,000 Units  1,000 Units Oral q morning - 10a Eduard Clos, MD   1,000 Units at 10/03/15 1128  . cycloSPORINE (RESTASIS) 0.05 % ophthalmic emulsion 1 drop  1 drop Both Eyes BID Eduard Clos, MD   1 drop at 10/03/15 1129  . diltiazem (CARDIZEM) tablet 30 mg  30 mg Oral 4 times per day Christiane Ha, MD   30 mg at 10/03/15 1133  . docusate sodium (COLACE) capsule 100 mg  100 mg Oral BID Eduard Clos, MD   100 mg at 10/03/15 1128  . ferrous sulfate tablet 325 mg  325 mg Oral Q breakfast Eduard Clos, MD   325 mg at 10/03/15 0600  . finasteride (PROSCAR) tablet 5 mg  5 mg Oral QPM Eduard Clos, MD   5 mg at  10/02/15 1725  . insulin aspart (novoLOG) injection 0-9 Units  0-9 Units Subcutaneous TID WC Eduard Clos, MD   1 Units at 10/03/15 1237  . insulin aspart (novoLOG) injection 3 Units  3 Units Subcutaneous TID WC Christiane Ha, MD   3 Units at 10/03/15 [phone]  . insulin glargine (LANTUS) injection 3 Units  3 Units Subcutaneous QHS Christiane Ha, MD      . isosorbide mononitrate (ISMO,MONOKET) tablet 20 mg  20 mg Oral BID Eduard Clos, MD   20 mg at 10/03/15 1139  . latanoprost (XALATAN) 0.005 % ophthalmic solution 1 drop  1 drop Both Eyes QHS Eduard Clos, MD   1 drop at 10/02/15 2123  . Linaclotide (LINZESS) capsule 145 mcg  145 mcg Oral q morning - 10a Eduard Clos, MD   145 mcg at 10/03/15 1128  . lisinopril (PRINIVIL,ZESTRIL) tablet 10 mg  10 mg Oral q morning - 10a Eduard Clos, MD   10 mg at 10/03/15 1130  . ondansetron (ZOFRAN) tablet 4 mg  4 mg Oral Q6H PRN Eduard Clos, MD       Or  . ondansetron [facility]) injection 4 mg  4 mg Intravenous Q6H PRN Eduard Clos, MD      . pantoprazole (PROTONIX) EC tablet 40 mg  40 mg Oral Daily Eduard Clos, MD   40 mg at 10/03/15 1128  . risperiDONE (RISPERDAL) tablet 2 mg  2 mg Oral QHS Eduard Clos, MD   2 mg at 10/02/15 2121  . senna (SENOKOT) tablet 17.2 mg  2 tablet Oral QHS Eduard Clos, MD   17.2 mg at 10/02/15 2121  . spironolactone (ALDACTONE) tablet 12.5 mg  12.5 mg Oral BID Lennette Bihari, MD   12.5 mg at 10/03/15 7741  . traMADol (ULTRAM) tablet 50 mg  50 mg Oral Q8H PRN Eduard Clos, MD         Discharge Medications: Please see discharge summary for a list of discharge medications.  Relevant Imaging Results:  Relevant Lab Results:   Additional Information    Roddie Mc MSW, [place], [place], [phone]

## 2015-10-03 NOTE — Progress Notes (Signed)
Patient has referral to [facility] -- appt made for 10/08/15 - if needing to changed [phone].  Thanks - Radio producer with [facility]

## 2015-10-03 NOTE — Care Management Important Message (Signed)
Important Message  Patient Details  Name: Jordan Rowe MRN: [id number] Date of Birth: March 19, 1946   Medicare Important Message Given:  Yes    Jeena Arnett Abena 10/03/2015, 11:30 AM

## 2015-10-04 LAB — BASIC METABOLIC PANEL
Anion gap: 8 (ref 5–15)
BUN: 9 mg/dL (ref 6–20)
CALCIUM: 8.9 mg/dL (ref 8.9–10.3)
CO2: 24 mmol/L (ref 22–32)
CREATININE: 0.95 mg/dL (ref 0.61–1.24)
Chloride: 108 mmol/L (ref 101–111)
GFR calc Af Amer: >60 mL/min (ref >60)
GLUCOSE: 109 mg/dL — AB (ref 65–99)
POTASSIUM: 4 mmol/L (ref 3.5–5.1)
SODIUM: 140 mmol/L (ref 135–145)

## 2015-10-04 LAB — BRAIN NATRIURETIC PEPTIDE
B NATRIURETIC PEPTIDE 5: 388.2 pg/mL — AB (ref 0.0–100.0)
B Natriuretic Peptide: 428.5 pg/mL — ABNORMAL HIGH (ref 0.0–100.0)

## 2015-10-04 LAB — GLUCOSE, CAPILLARY
GLUCOSE-CAPILLARY: 167 mg/dL — AB (ref 65–99)
GLUCOSE-CAPILLARY: 138 mg/dL — AB (ref 65–99)
Glucose-Capillary: 93 mg/dL (ref 65–99)
Glucose-Capillary: 106 mg/dL — ABNORMAL HIGH (ref 65–99)

## 2015-10-04 MED ORDER — CARVEDILOL 25 MG PO TABS
25.0000 mg | ORAL_TABLET | Freq: Two times a day (BID) | ORAL | Status: DC
  Administered 2015-10-04 – 2015-10-07 (×6): 25 mg via ORAL
  Filled 2015-10-04 (×6): qty 1

## 2015-10-04 NOTE — Progress Notes (Signed)
[facility] PROGRESS NOTE  Jordan Rowe MEQ:[id number] DOB: March 17, 1946 DOA: 09/30/2015 PCP: Pcp Not In System  Assessment/Plan:  Principal Problem:   Atrial fibrillation with RVR (HCC): duration unknown. Cardiology increasing coreg further, stopping cardizem. on eliquis. CHADSVASC 3.  Active Problems: Systolic dysfunction: echo with EF 20-25%. Already on ACE I, BB. No CHF on exam. Started on spironolactone. Euvolemic.    MENTAL RETARDATION: lives in ALF   Essential hypertension: increasing coreg   Chest pain: MI ruled out. CTA chest neg for PE   Diabetes mellitus type 2, controlled (HCC): hypoglycemic again this am. Decreasing insulin further   Schizophrenia [facility]): has been cooperative and appropriate   Smoker   Code Status:  full Family Communication:   Disposition Plan:  Eventually, back to ALF   HPI/Subjective: No dyspnea. No CP. No complaints  Objective: Filed Vitals:   10/04/15 0424 10/04/15 0823  BP: 124/70 144/110  Pulse:  114  Temp: 98.9 F (37.2 C)   Resp: 21     Intake/Output Summary (Last 24 hours) at 10/04/15 1018 Last data filed at 10/04/15 0837  Gross per 24 hour  Intake    960 ml  Output   2195 ml  Net  -1235 ml   Filed Weights   10/02/15 0003 10/03/15 0600 10/04/15 0700  Weight: 91.037 kg (200 lb 11.2 oz) 92.08 kg (203 lb) 90.311 kg (199 lb 1.6 oz)   Tele: a fib  Exam:   General:  Cooperative. Alert and appropriate. Lying flat in bed  Cardiovascular: irreg irreg without MGR  Respiratory: CTA without WRR  Abdomen: S, NT, ND  Ext: no CCE  Basic Metabolic Panel:  Recent Labs Lab 09/30/15 1850 10/01/15 0526 10/03/15 0037 10/03/15 0420 10/04/15 0426  NA 145 144  --  141 140  K 3.8 3.6  --  3.7 4.0  CL 106 109  --  107 108  CO2 26 28  --  25 24  GLUCOSE 83 102*  --  71 109*  BUN 9 10  --  9 9  CREATININE 1.05 1.10  --  0.96 0.95  CALCIUM 9.2 8.5*  --  8.5* 8.9  MG 2.0  --  2.0  --   --   PHOS 3.5  --   --   --   --     Liver Function Tests:  Recent Labs Lab 10/01/15 0526  AST 22  ALT 45  ALKPHOS 46  BILITOT 1.0  PROT 5.6*  ALBUMIN 3.2*   No results for input(s): LIPASE, AMYLASE in the last 168 hours. No results for input(s): AMMONIA in the last 168 hours. CBC:  Recent Labs Lab 09/30/15 1850 10/01/15 0526 10/02/15 0046  WBC 7.2 8.0 7.6  NEUTROABS  --  3.8  --   HGB 14.8 13.6 13.4  HCT 43.9 40.0 39.4  MCV 93.4 93.2 93.1  PLT 221 202 200   Cardiac Enzymes:  Recent Labs Lab 10/01/15 0004 10/01/15 0526 10/01/15 1144  TROPONINI <0.03 <0.03 <0.03   BNP (last 3 results)  Recent Labs  09/30/15 2056 10/03/15 0420 10/04/15 0420  BNP 201.2* 295.5* 388.2*    ProBNP (last 3 results) No results for input(s): PROBNP in the last 8760 hours.  CBG:  Recent Labs Lab 10/03/15 0741 10/03/15 1144 10/03/15 1637 10/03/15 2134 10/04/15 0723  GLUCAP 68 122* 99 138* 93    Recent Results (from the past 240 hour(s))  MRSA PCR Screening     Status: None   Collection Time:  09/30/15 11:22 PM  Result Value Ref Range Status   MRSA by PCR NEGATIVE NEGATIVE Final    Comment:        The GeneXpert MRSA Assay (FDA approved for NASAL specimens only), is one component of a comprehensive MRSA colonization surveillance program. It is not intended to diagnose MRSA infection nor to guide or monitor treatment for MRSA infections.      Studies: No results found. Echo Left ventricle: The cavity size was mildly dilated. Wall thickness was normal. Systolic function was severely reduced. The estimated ejection fraction was in the range of 20% to 25%. Diffuse hypokinesis. Features are consistent with a pseudonormal left ventricular filling pattern, with concomitant abnormal relaxation and increased filling pressure (grade 2 diastolic dysfunction). - Mitral valve: There was mild regurgitation. - Left atrium: The atrium was mildly dilated. - Right ventricle: Systolic function  was mildly to moderately reduced. - Right atrium: The atrium was mildly dilated.  Impressions:  - EF is markedly reduced when compared to prior study from 2014  Scheduled Meds: . apixaban  5 mg Oral BID  . aspirin  324 mg Oral Once  . brimonidine  1 drop Both Eyes BID  . carvedilol  25 mg Oral BID WC  . cholecalciferol  1,000 Units Oral q morning - 10a  . cycloSPORINE  1 drop Both Eyes BID  . docusate sodium  100 mg Oral BID  . ferrous sulfate  325 mg Oral Q breakfast  . finasteride  5 mg Oral QPM  . furosemide  40 mg Oral Daily  . insulin aspart  0-9 Units Subcutaneous TID WC  . insulin aspart  3 Units Subcutaneous TID WC  . insulin glargine  3 Units Subcutaneous QHS  . isosorbide mononitrate  20 mg Oral BID  . latanoprost  1 drop Both Eyes QHS  . Linaclotide  145 mcg Oral q morning - 10a  . lisinopril  10 mg Oral q morning - 10a  . pantoprazole  40 mg Oral Daily  . risperiDONE  2 mg Oral QHS  . senna  2 tablet Oral QHS  . spironolactone  12.5 mg Oral BID   Continuous Infusions:    Time spent: 25 minutes  Labrandon Knoch L  [facility] [email or web address], password [facility] 10/04/2015, 10:18 AM  LOS: 4 days

## 2015-10-04 NOTE — Progress Notes (Signed)
Patient Name: Aydn Bolivar Date of Encounter: 10/04/2015     Principal Problem:   Atrial fibrillation with RVR (HCC) Active Problems:   MENTAL RETARDATION   Essential hypertension   Chest pain   Diabetes mellitus type 2, controlled (HCC)   Schizophrenia (HCC)   Smoker    SUBJECTIVE  Denies any CP or SOB.   CURRENT MEDS . apixaban  5 mg Oral BID  . aspirin  324 mg Oral Once  . brimonidine  1 drop Both Eyes BID  . carvedilol  12.5 mg Oral BID WC  . cholecalciferol  1,000 Units Oral q morning - 10a  . cycloSPORINE  1 drop Both Eyes BID  . diltiazem  30 mg Oral 4 times per day  . docusate sodium  100 mg Oral BID  . ferrous sulfate  325 mg Oral Q breakfast  . finasteride  5 mg Oral QPM  . furosemide  40 mg Oral Daily  . insulin aspart  0-9 Units Subcutaneous TID WC  . insulin aspart  3 Units Subcutaneous TID WC  . insulin glargine  3 Units Subcutaneous QHS  . isosorbide mononitrate  20 mg Oral BID  . latanoprost  1 drop Both Eyes QHS  . Linaclotide  145 mcg Oral q morning - 10a  . lisinopril  10 mg Oral q morning - 10a  . pantoprazole  40 mg Oral Daily  . risperiDONE  2 mg Oral QHS  . senna  2 tablet Oral QHS  . spironolactone  12.5 mg Oral BID    OBJECTIVE  Filed Vitals:   10/04/15 0100 10/04/15 0424 10/04/15 0700 10/04/15 0823  BP: 125/89 124/70  144/110  Pulse:    114  Temp: 98.5 F (36.9 C) 98.9 F (37.2 C)    TempSrc: Oral Oral    Resp: 19 21    Height:      Weight:   199 lb 1.6 oz (90.311 kg)   SpO2: 95% 98%      Intake/Output Summary (Last 24 hours) at 10/04/15 0958 Last data filed at 10/04/15 0837  Gross per 24 hour  Intake    960 ml  Output   2195 ml  Net  -1235 ml   I/O since admission:  -2735  Filed Weights   10/02/15 0003 10/03/15 0600 10/04/15 0700  Weight: 200 lb 11.2 oz (91.037 kg) 203 lb (92.08 kg) 199 lb 1.6 oz (90.311 kg)    PHYSICAL EXAM  General: Pleasant, NAD. Neuro: Alert and oriented X 3. Moves all extremities  spontaneously. Psych: Normal affect. HEENT:  Normal  Neck: Supple without bruits or JVD. Lungs:  Resp regular and unlabored, CTA. Heart: RRR no s3, s4, or murmurs. Abdomen: Soft, non-tender, non-distended, BS + x 4.  Extremities: No clubbing, cyanosis or edema. DP/PT/Radials 2+ and equal bilaterally.  Accessory Clinical Findings  CBC  Recent Labs  10/02/15 0046  WBC 7.6  HGB 13.4  HCT 39.4  MCV 93.1  PLT 200   Basic Metabolic Panel  Recent Labs  10/03/15 0037 10/03/15 0420 10/04/15 0426  NA  --  141 140  K  --  3.7 4.0  CL  --  107 108  CO2  --  25 24  GLUCOSE  --  71 109*  BUN  --  9 9  CREATININE  --  0.96 0.95  CALCIUM  --  8.5* 8.9  MG 2.0  --   --    Liver Function Tests No results for input(s):  AST, ALT, ALKPHOS, BILITOT, PROT, ALBUMIN in the last 72 hours. Cardiac Enzymes  Recent Labs  10/01/15 1144  TROPONINI <0.03   BNP Invalid input(s): POCBNP D-Dimer  Recent Labs  10/01/15 1144  DDIMER 0.51*   Thyroid Function Tests No results for input(s): TSH, T4TOTAL, T3FREE, THYROIDAB in the last 72 hours.  Invalid input(s): FREET3  TELE afib with HR 80-120s    ECG  AF at 147 from admission;  Will repeat ECG  Echocardiogram 10/01/2015  LV EF: 20% -  25%  ------------------------------------------------------------------- Indications:   Atrial fibrillation - 427.31.  ------------------------------------------------------------------- History:  Risk factors: Hypertension. Diabetes mellitus.  ------------------------------------------------------------------- Study Conclusions  - Left ventricle: The cavity size was mildly dilated. Wall thickness was normal. Systolic function was severely reduced. The estimated ejection fraction was in the range of 20% to 25%. Diffuse hypokinesis. Features are consistent with a pseudonormal left ventricular filling pattern, with concomitant abnormal relaxation and increased filling  pressure (grade 2 diastolic dysfunction). - Mitral valve: There was mild regurgitation. - Left atrium: The atrium was mildly dilated. - Right ventricle: Systolic function was mildly to moderately reduced. - Right atrium: The atrium was mildly dilated.  Impressions:  - EF is markedly reduced when compared to prior study from 2014 (60%)    Radiology/Studies  Dg Chest 2 View  09/30/2015  CLINICAL DATA:  Chest pain EXAM: CHEST  2 VIEW COMPARISON:  09/14/2013 chest radiograph. FINDINGS: Stable cardiomediastinal silhouette with mild cardiomegaly. No pneumothorax. Trace left pleural effusion. Mild pulmonary edema. No acute consolidative airspace disease. IMPRESSION: Mild cardiomegaly and mild pulmonary edema, in keeping with mild congestive heart failure. Trace left pleural effusion. Electronically Signed   By: Delbert Phenix M.D.   On: 09/30/2015 20:03   Ct Angio Chest Pe W/cm &/or Wo Cm  10/01/2015  CLINICAL DATA:  Chest pain, shortness of breath, elevated D-dimer. EXAM: CT ANGIOGRAPHY CHEST WITH CONTRAST TECHNIQUE: Multidetector CT imaging of the chest was performed using the standard protocol during bolus administration of intravenous contrast. Multiplanar CT image reconstructions and MIPs were obtained to evaluate the vascular anatomy. CONTRAST:  100 cc Omnipaque 350. COMPARISON:  Chest radiograph 09/30/2015 FINDINGS: Mediastinum/Lymph Nodes: No pulmonary emboli or thoracic aortic dissection identified. There is right hilar lymphadenopathy measuring 1.9 mm. There is less prominent left hilar peribronchial thickening likely also representing lymphadenopathy. There is bilateral lower lobe peribronchial thickening. No evidence of mediastinal lymphadenopathy. The heart is enlarged. There is reflux of contrast to the hepatic veins, suggestive of right heart failure. Lungs/Pleura: Evaluation of the lungs limited by motion artifact. There is moderate right and smaller left pleural effusion, with  associated compressive subsegmental atelectasis of the lung bases. Upper abdomen: No acute findings. Musculoskeletal: No chest wall mass or suspicious bone lesions identified. There is bilateral gynecomastia. Review of the MIP images confirms the above findings. IMPRESSION: No evidence of significant pulmonary embolus, accounting for motion artifact. Right hilar lymphadenopathy. Less prominent left hilar lymphadenopathy. In the absence of evidence of malignancy this likely represents reactive lymphadenopathy. Bilateral lower lobe peribronchial thickening, likely inflammatory. Bilateral pleural effusions, right greater than left. Cardiomegaly, with evidence of right heart failure. Gynecomastia. Electronically Signed   By: Ted Mcalpine M.D.   On: 10/01/2015 17:25    ASSESSMENT AND PLAN  1. Atrial fibrillation with RVR  - CHADs VASc= 3 for age, HTN, DM  - Now on eliquis,  coreg to 12.5mg  BID. Lisinopril 10mg  daily and spironolactone 12.5 mg bid.  CTA of chest showed gynecomastia, however if  he complain of breast tenderness, can switch to eplirenone later. HR improved today but still in 90 - 100.  Will further titrate coreg to 25 mg bid and dc cardizem with LV dysfunction.     2. Newly diagnosed LV dysfunction with acute systolic HF  - Echo 02/03/2013 EF 60-65%, no RWMA  - Echo 10/01/2015 EF 20-25%, grade 2 DD, mild MR  - possible etiology include tachy induced cardiomyopathy vs ischemic cardiomyopathy. Plan for lexiscan myoview which can be done as inpatient or outpatient.   - given LV dysfunction, continue coreg, ACEI, spironolactone, and furosemide was resumed yesterday.  On chronic nitrates.   Good diuresis with -2735 since admission.  3. HTN  4. DM  5. Schizophrenia  6. Mental retardation  7. Elevated d-dimer: CTA negative   Lennette Bihari, MD, [facility] 10/04/2015 9:58 AM

## 2015-10-05 DIAGNOSIS — I4729 Other ventricular tachycardia: Secondary | ICD-10-CM

## 2015-10-05 DIAGNOSIS — I472 Ventricular tachycardia: Secondary | ICD-10-CM

## 2015-10-05 DIAGNOSIS — I429 Cardiomyopathy, unspecified: Secondary | ICD-10-CM | POA: Insufficient documentation

## 2015-10-05 LAB — BASIC METABOLIC PANEL
Anion gap: 9 (ref 5–15)
BUN: 10 mg/dL (ref 6–20)
CALCIUM: 8.6 mg/dL — AB (ref 8.9–10.3)
CO2: 24 mmol/L (ref 22–32)
Chloride: 106 mmol/L (ref 101–111)
Creatinine, Ser: 1.03 mg/dL (ref 0.61–1.24)
GFR calc Af Amer: >60 mL/min (ref >60)
GLUCOSE: 89 mg/dL (ref 65–99)
Potassium: 4 mmol/L (ref 3.5–5.1)
Sodium: 139 mmol/L (ref 135–145)

## 2015-10-05 LAB — GLUCOSE, CAPILLARY
Glucose-Capillary: 96 mg/dL (ref 65–99)
Glucose-Capillary: 136 mg/dL — ABNORMAL HIGH (ref 65–99)
Glucose-Capillary: 132 mg/dL — ABNORMAL HIGH (ref 65–99)
Glucose-Capillary: 113 mg/dL — ABNORMAL HIGH (ref 65–99)

## 2015-10-05 MED ORDER — SPIRONOLACTONE 25 MG PO TABS
25.0000 mg | ORAL_TABLET | Freq: Two times a day (BID) | ORAL | Status: DC
  Administered 2015-10-05 – 2015-10-07 (×4): 25 mg via ORAL
  Filled 2015-10-05 (×4): qty 1

## 2015-10-05 NOTE — Progress Notes (Signed)
At shift change pt bp 142/104. Day shift Rn gave Isosorbide to pt. Night shift RN will cont to monitor

## 2015-10-05 NOTE — Progress Notes (Signed)
Pt up to the chair most of the day. Pt verbalizing that he wants to go home tomorrow. RN educated pt about Stress test in the am

## 2015-10-05 NOTE — Progress Notes (Signed)
[facility] PROGRESS NOTE  Jordan Rowe OFH:[id number] DOB: [birth year] DOA: 09/30/2015 PCP: Pcp Not In System  Assessment/Plan:  Principal Problem:   Atrial fibrillation with RVR (HCC): duration unknown. On Coreg. Stopped Cardizem. on eliquis. CHADSVASC 3.  Active Problems: NSVT: Electrolytes okay. Asymptomatic. Per cardiology Systolic dysfunction: echo with EF 20-25%. Already on ACE I, BB. No CHF on exam. Started on spironolactone.    MENTAL RETARDATION: lives in ALF   Essential hypertension: increasing coreg   Chest pain: MI ruled out. CTA chest neg for PE   Diabetes mellitus type 2, controlled (HCC): Blood glucoses stable without further hypoglycemia   Schizophrenia (HCC): has been cooperative and appropriate   Smoker   Code Status:  full Family Communication:   Disposition Plan:  Eventually, back to ALF   HPI/Subjective: No complaints  Objective: Filed Vitals:   10/05/15 0104 10/05/15 0600  BP: 143/98 129/96  Pulse:    Temp: 98.6 F (37 C) 98.6 F (37 C)  Resp: 18 19    Intake/Output Summary (Last 24 hours) at 10/05/15 1042 Last data filed at 10/05/15 0929  Gross per 24 hour  Intake    540 ml  Output    750 ml  Net   -210 ml   Filed Weights   10/03/15 0600 10/04/15 0700 10/05/15 0600  Weight: 92.08 kg (203 lb) 90.311 kg (199 lb 1.6 oz) 89.994 kg (198 lb 6.4 oz)   Tele: a fib  Exam:   General:  Talking on the phone. Appears comfortable. Breathing nonlabored.  Cardiovascular: irreg irreg without MGR  Respiratory: CTA without WRR  Abdomen: S, NT, ND  Ext: no CCE  Basic Metabolic Panel:  Recent Labs Lab 09/30/15 1850 10/01/15 0526 10/03/15 0037 10/03/15 0420 10/04/15 0426 10/05/15 0441  NA 145 144  --  141 140 139  K 3.8 3.6  --  3.7 4.0 4.0  CL 106 109  --  107 108 106  CO2 26 28  --  25 24 24   GLUCOSE 83 102*  --  71 109* 89  BUN 9 10  --  9 9 10   CREATININE 1.05 1.10  --  0.96 0.95 1.03  CALCIUM 9.2 8.5*  --  8.5* 8.9 8.6*   MG 2.0  --  2.0  --   --   --   PHOS 3.5  --   --   --   --   --    Liver Function Tests:  Recent Labs Lab 10/01/15 0526  AST 22  ALT 45  ALKPHOS 46  BILITOT 1.0  PROT 5.6*  ALBUMIN 3.2*   No results for input(s): LIPASE, AMYLASE in the last 168 hours. No results for input(s): AMMONIA in the last 168 hours. CBC:  Recent Labs Lab 09/30/15 1850 10/01/15 0526 10/02/15 0046  WBC 7.2 8.0 7.6  NEUTROABS  --  3.8  --   HGB 14.8 13.6 13.4  HCT 43.9 40.0 39.4  MCV 93.4 93.2 93.1  PLT 221 202 200   Cardiac Enzymes:  Recent Labs Lab 10/01/15 0004 10/01/15 0526 10/01/15 1144  TROPONINI <0.03 <0.03 <0.03   BNP (last 3 results)  Recent Labs  10/03/15 0420 10/04/15 0420 10/04/15 1036  BNP 295.5* 388.2* 428.5*    ProBNP (last 3 results) No results for input(s): PROBNP in the last 8760 hours.  CBG:  Recent Labs Lab 10/04/15 0723 10/04/15 1130 10/04/15 1650 10/04/15 2120 10/05/15 0752  GLUCAP 93 167* 106* 138* 96    Recent Results (  from the past 240 hour(s))  MRSA PCR Screening     Status: None   Collection Time: 09/30/15 11:22 PM  Result Value Ref Range Status   MRSA by PCR NEGATIVE NEGATIVE Final    Comment:        The GeneXpert MRSA Assay (FDA approved for NASAL specimens only), is one component of a comprehensive MRSA colonization surveillance program. It is not intended to diagnose MRSA infection nor to guide or monitor treatment for MRSA infections.      Studies: No results found. Echo Left ventricle: The cavity size was mildly dilated. Wall thickness was normal. Systolic function was severely reduced. The estimated ejection fraction was in the range of 20% to 25%. Diffuse hypokinesis. Features are consistent with a pseudonormal left ventricular filling pattern, with concomitant abnormal relaxation and increased filling pressure (grade 2 diastolic dysfunction). - Mitral valve: There was mild regurgitation. - Left atrium:  The atrium was mildly dilated. - Right ventricle: Systolic function was mildly to moderately reduced. - Right atrium: The atrium was mildly dilated.  Impressions:  - EF is markedly reduced when compared to prior study from 2014  Scheduled Meds: . apixaban  5 mg Oral BID  . aspirin  324 mg Oral Once  . brimonidine  1 drop Both Eyes BID  . carvedilol  25 mg Oral BID WC  . cholecalciferol  1,000 Units Oral q morning - 10a  . cycloSPORINE  1 drop Both Eyes BID  . docusate sodium  100 mg Oral BID  . ferrous sulfate  325 mg Oral Q breakfast  . finasteride  5 mg Oral QPM  . furosemide  40 mg Oral Daily  . insulin aspart  0-9 Units Subcutaneous TID WC  . insulin aspart  3 Units Subcutaneous TID WC  . insulin glargine  3 Units Subcutaneous QHS  . isosorbide mononitrate  20 mg Oral BID  . latanoprost  1 drop Both Eyes QHS  . Linaclotide  145 mcg Oral q morning - 10a  . lisinopril  10 mg Oral q morning - 10a  . pantoprazole  40 mg Oral Daily  . risperiDONE  2 mg Oral QHS  . senna  2 tablet Oral QHS  . spironolactone  12.5 mg Oral BID   Continuous Infusions:    Time spent: 15 minutes  Jordan Rowe L  [facility] [email or web address], password [facility] 10/05/2015, 10:42 AM  LOS: 5 days

## 2015-10-05 NOTE — Progress Notes (Signed)
Patient Name: Jordan Rowe Date of Encounter: 10/05/2015     Principal Problem:   Atrial fibrillation with RVR [facility]) Active Problems:   MENTAL RETARDATION   Essential hypertension   Chest pain   Diabetes mellitus type 2, controlled (HCC)   Schizophrenia (HCC)   Smoker   NSVT (nonsustained ventricular tachycardia) (HCC)    SUBJECTIVE  Denies any CP or SOB.   CURRENT MEDS . apixaban  5 mg Oral BID  . aspirin  324 mg Oral Once  . brimonidine  1 drop Both Eyes BID  . carvedilol  25 mg Oral BID WC  . cholecalciferol  1,000 Units Oral q morning - 10a  . cycloSPORINE  1 drop Both Eyes BID  . docusate sodium  100 mg Oral BID  . ferrous sulfate  325 mg Oral Q breakfast  . finasteride  5 mg Oral QPM  . furosemide  40 mg Oral Daily  . insulin aspart  0-9 Units Subcutaneous TID WC  . insulin aspart  3 Units Subcutaneous TID WC  . insulin glargine  3 Units Subcutaneous QHS  . isosorbide mononitrate  20 mg Oral BID  . latanoprost  1 drop Both Eyes QHS  . Linaclotide  145 mcg Oral q morning - 10a  . lisinopril  10 mg Oral q morning - 10a  . pantoprazole  40 mg Oral Daily  . risperiDONE  2 mg Oral QHS  . senna  2 tablet Oral QHS  . spironolactone  12.5 mg Oral BID    OBJECTIVE  Filed Vitals:   10/04/15 2025 10/04/15 2031 10/05/15 0104 10/05/15 0600  BP:  144/101 143/98 129/96  Pulse: 119     Temp: 98.1 F (36.7 C)  98.6 F (37 C) 98.6 F (37 C)  TempSrc: Oral  Oral Oral  Resp: Height:      Weight:    198 lb 6.4 oz (89.994 kg)  SpO2: 100%  95% 93%    Intake/Output Summary (Last 24 hours) at 10/05/15 1104 Last data filed at 10/05/15 0929  Gross per 24 hour  Intake    540 ml  Output    750 ml  Net   -210 ml   I/O since admission:  -2945  Filed Weights   10/03/15 0600 10/04/15 0700 10/05/15 0600  Weight: 203 lb (92.08 kg) 199 lb 1.6 oz (90.311 kg) 198 lb 6.4 oz (89.994 kg)    PHYSICAL EXAM  General: Pleasant, NAD. Neuro: Alert and oriented X  3. Moves all extremities spontaneously. Psych: Normal affect. HEENT:  Normal  Neck: Supple without bruits or JVD. Lungs:  Resp regular and unlabored, CTA. Heart: RRR no s3, s4, or murmurs. Abdomen: Soft, non-tender, non-distended, BS + x 4.  Extremities: No clubbing, cyanosis or edema. DP/PT/Radials 2+ and equal bilaterally.  Accessory Clinical Findings  CBC No results for input(s): WBC, NEUTROABS, HGB, HCT, MCV, PLT in the last 72 hours. Basic Metabolic Panel  Recent Labs  10/03/15 0037  10/04/15 0426 10/05/15 0441  NA  --   < > 140 139  K  --   < > 4.0 4.0  CL  --   < > 108 106  CO2  --   < > 24 24  GLUCOSE  --   < > 109* 89  BUN  --   < > 9 10  CREATININE  --   < > 0.95 1.03  CALCIUM  --   < > 8.9 8.6*  MG 2.0  --   --   --   < > = values in this interval not displayed. Liver Function Tests No results for input(s): AST, ALT, ALKPHOS, BILITOT, PROT, ALBUMIN in the last 72 hours. Cardiac Enzymes No results for input(s): CKTOTAL, CKMB, CKMBINDEX, TROPONINI in the last 72 hours. BNP Invalid input(s): POCBNP D-Dimer No results for input(s): DDIMER in the last 72 hours. Thyroid Function Tests No results for input(s): TSH, T4TOTAL, T3FREE, THYROIDAB in the last 72 hours.  Invalid input(s): FREET3  TELE afib with HR 80-120s    ECG  AF at 147 from admission;  Will repeat ECG  Echocardiogram 10/01/2015  LV EF: 20% -  25%  ------------------------------------------------------------------- Indications:   Atrial fibrillation - 427.31.  ------------------------------------------------------------------- History:  Risk factors: Hypertension. Diabetes mellitus.  ------------------------------------------------------------------- Study Conclusions  - Left ventricle: The cavity size was mildly dilated. Wall thickness was normal. Systolic function was severely reduced. The estimated ejection fraction was in the range of 20% to 25%. Diffuse hypokinesis.  Features are consistent with a pseudonormal left ventricular filling pattern, with concomitant abnormal relaxation and increased filling pressure (grade 2 diastolic dysfunction). - Mitral valve: There was mild regurgitation. - Left atrium: The atrium was mildly dilated. - Right ventricle: Systolic function was mildly to moderately reduced. - Right atrium: The atrium was mildly dilated.  Impressions:  - EF is markedly reduced when compared to prior study from 2014 (60%)    Radiology/Studies  Dg Chest 2 View  09/30/2015  CLINICAL DATA:  Chest pain EXAM: CHEST  2 VIEW COMPARISON:  09/14/2013 chest radiograph. FINDINGS: Stable cardiomediastinal silhouette with mild cardiomegaly. No pneumothorax. Trace left pleural effusion. Mild pulmonary edema. No acute consolidative airspace disease. IMPRESSION: Mild cardiomegaly and mild pulmonary edema, in keeping with mild congestive heart failure. Trace left pleural effusion. Electronically Signed   By: Delbert Phenix M.D.   On: 09/30/2015 20:03   Ct Angio Chest Pe W/cm &/or Wo Cm  10/01/2015  CLINICAL DATA:  Chest pain, shortness of breath, elevated D-dimer. EXAM: CT ANGIOGRAPHY CHEST WITH CONTRAST TECHNIQUE: Multidetector CT imaging of the chest was performed using the standard protocol during bolus administration of intravenous contrast. Multiplanar CT image reconstructions and MIPs were obtained to evaluate the vascular anatomy. CONTRAST:  100 cc Omnipaque 350. COMPARISON:  Chest radiograph 09/30/2015 FINDINGS: Mediastinum/Lymph Nodes: No pulmonary emboli or thoracic aortic dissection identified. There is right hilar lymphadenopathy measuring 1.9 mm. There is less prominent left hilar peribronchial thickening likely also representing lymphadenopathy. There is bilateral lower lobe peribronchial thickening. No evidence of mediastinal lymphadenopathy. The heart is enlarged. There is reflux of contrast to the hepatic veins, suggestive of right heart  failure. Lungs/Pleura: Evaluation of the lungs limited by motion artifact. There is moderate right and smaller left pleural effusion, with associated compressive subsegmental atelectasis of the lung bases. Upper abdomen: No acute findings. Musculoskeletal: No chest wall mass or suspicious bone lesions identified. There is bilateral gynecomastia. Review of the MIP images confirms the above findings. IMPRESSION: No evidence of significant pulmonary embolus, accounting for motion artifact. Right hilar lymphadenopathy. Less prominent left hilar lymphadenopathy. In the absence of evidence of malignancy this likely represents reactive lymphadenopathy. Bilateral lower lobe peribronchial thickening, likely inflammatory. Bilateral pleural effusions, right greater than left. Cardiomegaly, with evidence of right heart failure. Gynecomastia. Electronically Signed   By: Ted Mcalpine M.D.   On: 10/01/2015 17:25    ASSESSMENT AND PLAN  1. Atrial fibrillation with RVR  - CHADs VASc= 3 for  age, HTN, DM  - Now on eliquis,  coreg to 12.5mg  BID. Lisinopril 10mg  daily and spironolactone 12.5 mg bid.  CTA of chest showed gynecomastia, however if he complain of breast tenderness, can switch to eplirenone later. HR improved today but still increases intermittently to 110.  Now on coreg to 25 mg bid increased yesterday, but not yet steady state.  2. Newly diagnosed LV dysfunction with acute systolic HF  - Echo 02/03/2013 EF 60-65%, no RWMA  - Echo 10/01/2015 EF 20-25%, grade 2 DD, mild MR  - possible etiology include tachy induced NICM vs ischemic cardiomyopathy. With  LV dysfunction, continue coreg, ACEI, spironolactone, and furosemide was resumed.  On chronic nitrates.  Consider addition of hydralazine.  Good diuresis with -2945 since admission. Will increase spironolactone to 25 mg bid.  F/U BNP. Since patient lives in a facility; will try to arrange for lexiscan cardiolite scan tomorrow as inpatient.   3. HTN  4.  DM  5. Schizophrenia  6. Mental retardation  7. Elevated d-dimer: CTA negative   Lennette Bihari, MD, [facility] 10/05/2015 11:04 AM

## 2015-10-05 NOTE — Progress Notes (Signed)
Pt up to chair, pt bathed, linens changed. Pt states he has no pain at this time, HR is 90s-100, pt is resting. Will continue to monitor. Cecille Rubin, RN

## 2015-10-05 NOTE — Progress Notes (Signed)
Patient had a 13 beat run of Vtach. Patient asymptomatic; currently sleeping. Notified T. Claiborne Billings NP. Will continue to closely monitor.

## 2015-10-05 NOTE — Plan of Care (Signed)
Problem: Education: Goal: Knowledge of disease or condition will improve Outcome: Progressing Pt slowly understanding A-Fib, will continue to monitor. Cecille Rubin, RN

## 2015-10-06 ENCOUNTER — Inpatient Hospital Stay (HOSPITAL_COMMUNITY): Payer: Medicare Other

## 2015-10-06 ENCOUNTER — Encounter (HOSPITAL_COMMUNITY): Payer: Medicare Other

## 2015-10-06 DIAGNOSIS — R079 Chest pain, unspecified: Secondary | ICD-10-CM

## 2015-10-06 LAB — NM MYOCAR MULTI W/SPECT W/WALL MOTION / EF
LV dias vol: 140 mL
LV sys vol: 113 mL
Peak HR: 133 {beats}/min
RATE: 1.1
Rest HR: 115 {beats}/min
SDS: 3
SRS: 5
SSS: 8
TID: 0.35

## 2015-10-06 LAB — BASIC METABOLIC PANEL
ANION GAP: 12 (ref 5–15)
BUN: 12 mg/dL (ref 6–20)
CHLORIDE: 105 mmol/L (ref 101–111)
CO2: 23 mmol/L (ref 22–32)
Calcium: 8.8 mg/dL — ABNORMAL LOW (ref 8.9–10.3)
Creatinine, Ser: 1.01 mg/dL (ref 0.61–1.24)
GFR calc non Af Amer: >60 mL/min (ref >60)
GLUCOSE: 79 mg/dL (ref 65–99)
POTASSIUM: 3.8 mmol/L (ref 3.5–5.1)
Sodium: 140 mmol/L (ref 135–145)

## 2015-10-06 LAB — GLUCOSE, CAPILLARY
GLUCOSE-CAPILLARY: 85 mg/dL (ref 65–99)
Glucose-Capillary: 158 mg/dL — ABNORMAL HIGH (ref 65–99)
Glucose-Capillary: 86 mg/dL (ref 65–99)

## 2015-10-06 LAB — BRAIN NATRIURETIC PEPTIDE: B Natriuretic Peptide: 472 pg/mL — ABNORMAL HIGH (ref 0.0–100.0)

## 2015-10-06 MED ORDER — REGADENOSON 0.4 MG/5ML IV SOLN
INTRAVENOUS | Status: AC
  Filled 2015-10-06: qty 5

## 2015-10-06 MED ORDER — REGADENOSON 0.4 MG/5ML IV SOLN
0.4000 mg | Freq: Once | INTRAVENOUS | Status: AC
  Administered 2015-10-06: 0.4 mg via INTRAVENOUS

## 2015-10-06 MED ORDER — TECHNETIUM TC 99M SESTAMIBI GENERIC - CARDIOLITE
30.0000 | Freq: Once | INTRAVENOUS | Status: AC | PRN
  Administered 2015-10-06: 30 via INTRAVENOUS

## 2015-10-06 MED ORDER — AMIODARONE HCL 200 MG PO TABS
400.0000 mg | ORAL_TABLET | Freq: Two times a day (BID) | ORAL | Status: DC
  Administered 2015-10-06 – 2015-10-07 (×3): 400 mg via ORAL
  Filled 2015-10-06 (×3): qty 2

## 2015-10-06 MED ORDER — LISINOPRIL 20 MG PO TABS
20.0000 mg | ORAL_TABLET | Freq: Every morning | ORAL | Status: DC
  Administered 2015-10-07: 20 mg via ORAL
  Filled 2015-10-06: qty 1

## 2015-10-06 MED ORDER — TECHNETIUM TC 99M SESTAMIBI GENERIC - CARDIOLITE
10.0000 | Freq: Once | INTRAVENOUS | Status: AC | PRN
  Administered 2015-10-06: 10 via INTRAVENOUS

## 2015-10-06 NOTE — Progress Notes (Signed)
Pt back from stress test resting comfortable. Pt hr continues to range to 110's. First dose of amiodarone given this am. Will continue to monitor. Cecille Rubin, RN

## 2015-10-06 NOTE — Plan of Care (Signed)
Problem: Education: Goal: Understanding of medication regimen will improve Outcome: Progressing Pt continues to be educated on new medications, and medication adjustments.

## 2015-10-06 NOTE — Progress Notes (Signed)
Lexiscan completed.  Ailynn Gow, PAC

## 2015-10-06 NOTE — Plan of Care (Signed)
Problem: Education: Goal: Knowledge of [facility] Education information/materials will improve Outcome: Progressing Reviewed safety issues and policies

## 2015-10-06 NOTE — Progress Notes (Signed)
Report received via [place] RN in patient's room using [employer], reviewed chart, orders, labs, tests, VS, meds and patient's general condition, assumed care of patient.

## 2015-10-06 NOTE — Progress Notes (Addendum)
    Subjective:  Denies further chest pain, palpitations, or shortness of breath.  Objective:  Vital Signs in the last 24 hours: Temp:  [97.6 F (36.4 C)-98.5 F (36.9 C)] 98.5 F (36.9 C) (02/06 0400) Pulse Rate:  [90-113] 113 (02/06 0744) Resp:  [19-28] 23 (02/06 0745) BP: (115-150)/(86-115) 148/115 mmHg (02/06 0745) SpO2:  [95 %-100 %] 96 % (02/06 0744) Weight:  [88.542 kg (195 lb 3.2 oz)] 88.542 kg (195 lb 3.2 oz) (02/06 0400)  Intake/Output from previous day: 02/05 0701 - 02/06 0700 In: 1140 [P.O.:1140] Out: 800 [Urine:800]  Physical Exam: Pt is alert and oriented, NAD HEENT: normal Neck: JVP - normal Lungs: CTA bilaterally CV: Irregularly irregular, tachycardic, no murmur Abd: soft, NT, Positive BS, no hepatomegaly Ext: no C/C/E Skin: warm/dry no rash   Lab Results: No results for input(s): WBC, HGB, PLT in the last 72 hours.  Recent Labs  10/05/15 0441 10/06/15 0603  NA 139 140  K 4.0 3.8  CL 106 105  CO2 24 23  GLUCOSE 89 79  BUN 10 12  CREATININE 1.03 1.01   No results for input(s): TROPONINI in the last 72 hours.  Invalid input(s): CK, MB  Cardiac Studies: 2D Echo: Study Conclusions  - Left ventricle: The cavity size was mildly dilated. Wall thickness was normal. Systolic function was severely reduced. The estimated ejection fraction was in the range of 20% to 25%. Diffuse hypokinesis. Features are consistent with a pseudonormal left ventricular filling pattern, with concomitant abnormal relaxation and increased filling pressure (grade 2 diastolic dysfunction). - Mitral valve: There was mild regurgitation. - Left atrium: The atrium was mildly dilated. - Right ventricle: Systolic function was mildly to moderately reduced. - Right atrium: The atrium was mildly dilated.  Impressions:  - EF is markedly reduced when compared to prior study from 2014 (60%)  Tele: Atrial fibrillation, heart rate ranging between 101 130  bpm  Assessment/Plan:  1. Atrial fibrillation with rapid ventricular response 2. Acute systolic heart failure with LVEF 20-25% 3. Hypertension with heart failure 4. Type 2 diabetes 5. Schizophrenia  Chart reviewed. I have reviewed notes of Dr. Tresa Endo and also have looked over this patient's data carefully. He has new LV dysfunction in the setting of atrial fibrillation with RVR. 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 His atrial fibrillation remains uncontrolled on carvedilol at goal dose. He is appropriately anticoagulated with Eliquis. I think we should add amiodarone for rate control of his atrial fibrillation in the setting of heart failure and severe LV dysfunction. Amiodarone 400 mg twice daily 2 weeks then 200 mg daily. Will await results of his nuclear scan, but I would only pursue cardiac catheterization if his study is  demonstrates significant ischemia. By definition the nuclear scan will be interpreted as "high risk" based on severe LV dysfunction. Recommend increase lisinopril to 20 mg to better control hypertension. Will follow-up after his nuclear scan is completed.  Tonny Bollman, M.D. 10/06/2015, 9:39 AM

## 2015-10-06 NOTE — Progress Notes (Signed)
[facility] PROGRESS NOTE  Jordan Rowe:[id number] DOB: [birth year] DOA: 09/30/2015 PCP: Pcp Not In System  Assessment/Plan:  Principal Problem:   Atrial fibrillation with RVR (HCC): duration unknown. On Coreg. Cardiology starting amiodarone. on eliquis. CHADSVASC 3.  Active Problems: NSVT: Electrolytes okay. Asymptomatic. Per cardiology Systolic dysfunction: echo with EF 20-25%. Already on ACE I, BB. No CHF on exam. Started on spironolactone. For myoview today.   MENTAL RETARDATION: lives in ALF   Essential hypertension: adjusting meds   Chest pain: MI ruled out. CTA chest neg for PE   Diabetes mellitus type 2, controlled (HCC): Blood glucoses stable without further hypoglycemia   Schizophrenia (HCC): has been cooperative and appropriate   Smoker   Code Status:  full Family Communication:   Disposition Plan:  Eventually, back to ALF   HPI/Subjective: No complaints  Objective: Filed Vitals:   10/06/15 1310 10/06/15 1312  BP: 136/113   Pulse: 112 121  Temp:    Resp:      Intake/Output Summary (Last 24 hours) at 10/06/15 1456 Last data filed at 10/06/15 1433  Gross per 24 hour  Intake    360 ml  Output   1400 ml  Net  -1040 ml   Filed Weights   10/04/15 0700 10/05/15 0600 10/06/15 0400  Weight: 90.311 kg (199 lb 1.6 oz) 89.994 kg (198 lb 6.4 oz) 88.542 kg (195 lb 3.2 oz)   Tele: a fib  Exam:   General:  A and o  Cardiovascular: irreg irreg without MGR  Respiratory: CTA without WRR  Abdomen: S, NT, ND  Ext: no CCE  Basic Metabolic Panel:  Recent Labs Lab 09/30/15 1850 10/01/15 0526 10/03/15 0037 10/03/15 0420 10/04/15 0426 10/05/15 0441 10/06/15 0603  NA 145 144  --  141 140 139 140  K 3.8 3.6  --  3.7 4.0 4.0 3.8  CL 106 109  --  107 108 106 105  CO2 26 28  --  GLUCOSE 83 102*  --  71 109* 89 79  BUN 9 10  --  CREATININE 1.05 1.10  --  0.96 0.95 1.03 1.01  CALCIUM 9.2 8.5*  --  8.5* 8.9 8.6* 8.8*  MG  2.0  --  2.0  --   --   --   --   PHOS 3.5  --   --   --   --   --   --    Liver Function Tests:  Recent Labs Lab 10/01/15 0526  AST 22  ALT 45  ALKPHOS 46  BILITOT 1.0  PROT 5.6*  ALBUMIN 3.2*   No results for input(s): LIPASE, AMYLASE in the last 168 hours. No results for input(s): AMMONIA in the last 168 hours. CBC:  Recent Labs Lab 09/30/15 1850 10/01/15 0526 10/02/15 0046  WBC 7.2 8.0 7.6  NEUTROABS  --  3.8  --   HGB 14.8 13.6 13.4  HCT 43.9 40.0 39.4  MCV 93.4 93.2 93.1  PLT 221 202 200   Cardiac Enzymes:  Recent Labs Lab 10/01/15 0004 10/01/15 0526 10/01/15 1144  TROPONINI <0.03 <0.03 <0.03   BNP (last 3 results)  Recent Labs  10/04/15 0420 10/04/15 1036 10/06/15 0603  BNP 388.2* 428.5* 472.0*    ProBNP (last 3 results) No results for input(s): PROBNP in the last 8760 hours.  CBG:  Recent Labs Lab 10/05/15 0752 10/05/15 1137 10/05/15 1625 10/05/15 2034 10/06/15 0743  GLUCAP 96 136*  132* 113* 85    Recent Results (from the past 240 hour(s))  MRSA PCR Screening     Status: None   Collection Time: 09/30/15 11:22 PM  Result Value Ref Range Status   MRSA by PCR NEGATIVE NEGATIVE Final    Comment:        The GeneXpert MRSA Assay (FDA approved for NASAL specimens only), is one component of a comprehensive MRSA colonization surveillance program. It is not intended to diagnose MRSA infection nor to guide or monitor treatment for MRSA infections.      Studies: No results found. Echo Left ventricle: The cavity size was mildly dilated. Wall thickness was normal. Systolic function was severely reduced. The estimated ejection fraction was in the range of 20% to 25%. Diffuse hypokinesis. Features are consistent with a pseudonormal left ventricular filling pattern, with concomitant abnormal relaxation and increased filling pressure (grade 2 diastolic dysfunction). - Mitral valve: There was mild regurgitation. - Left  atrium: The atrium was mildly dilated. - Right ventricle: Systolic function was mildly to moderately reduced. - Right atrium: The atrium was mildly dilated.  Impressions:  - EF is markedly reduced when compared to prior study from 2014  Scheduled Meds: . amiodarone  400 mg Oral BID  . apixaban  5 mg Oral BID  . aspirin  324 mg Oral Once  . brimonidine  1 drop Both Eyes BID  . carvedilol  25 mg Oral BID WC  . cholecalciferol  1,000 Units Oral q morning - 10a  . cycloSPORINE  1 drop Both Eyes BID  . docusate sodium  100 mg Oral BID  . ferrous sulfate  325 mg Oral Q breakfast  . finasteride  5 mg Oral QPM  . furosemide  40 mg Oral Daily  . insulin aspart  0-9 Units Subcutaneous TID WC  . insulin aspart  3 Units Subcutaneous TID WC  . insulin glargine  3 Units Subcutaneous QHS  . isosorbide mononitrate  20 mg Oral BID  . latanoprost  1 drop Both Eyes QHS  . Linaclotide  145 mcg Oral q morning - 10a  . [START ON 10/07/2015] lisinopril  20 mg Oral q morning - 10a  . pantoprazole  40 mg Oral Daily  . regadenoson      . risperiDONE  2 mg Oral QHS  . senna  2 tablet Oral QHS  . spironolactone  25 mg Oral BID   Continuous Infusions:    Time spent: 15 minutes  Aleyza Salmi L  [facility] [email or web address], password [facility] 10/06/2015, 2:56 PM  LOS: 6 days

## 2015-10-06 NOTE — Progress Notes (Deleted)
Patient made NPO for surgery in the am, chlorahexadine bath done and all bedding and gown changed, will continue to monitor patient, VSS, pain is relieved with Oxy and he's sleeping at this time after having his Restoril earlier.

## 2015-10-06 NOTE — Clinical Social Work Note (Signed)
BSW intern following patient for discharge needs. Per RNCM patient will likely discharge tomorrow.   Jenita Seashore BSW Intern, [phone]

## 2015-10-06 NOTE — Progress Notes (Signed)
Report received via [place] RN in patient's room using [employer], reviewed orders, labs, VS, meds and patient's general condition. Patient will be NPO after midnight for a Stress test in the am, will continue to monitor.

## 2015-10-06 NOTE — Care Management Important Message (Signed)
Important Message  Patient Details  Name: Jordan Rowe MRN: [id number] Date of Birth: 20-Nov-1945   Medicare Important Message Given:  Yes    Rayvon Char 10/06/2015, 12:36 PMImportant Message  Patient Details  Name: Jordan Rowe MRN: [id number] Date of Birth: 17-Apr-1946   Medicare Important Message Given:  Yes    Shandora Koogler, [place] 10/06/2015, 12:36 PM

## 2015-10-07 DIAGNOSIS — I5021 Acute systolic (congestive) heart failure: Secondary | ICD-10-CM

## 2015-10-07 LAB — GLUCOSE, CAPILLARY
Glucose-Capillary: 74 mg/dL (ref 65–99)
Glucose-Capillary: 122 mg/dL — ABNORMAL HIGH (ref 65–99)

## 2015-10-07 MED ORDER — APIXABAN 5 MG PO TABS: 5.0000 mg | ORAL_TABLET | Freq: Two times a day (BID) | ORAL | Status: DC

## 2015-10-07 MED ORDER — CARVEDILOL 25 MG PO TABS: 25.0000 mg | ORAL_TABLET | Freq: Two times a day (BID) | ORAL | Status: DC

## 2015-10-07 MED ORDER — INSULIN GLARGINE 100 UNIT/ML ~~LOC~~ SOLN: 5.0000 [IU] | Freq: Every day | SUBCUTANEOUS | Status: AC

## 2015-10-07 MED ORDER — HYDRALAZINE HCL 25 MG PO TABS
25.0000 mg | ORAL_TABLET | Freq: Three times a day (TID) | ORAL | Status: DC
  Administered 2015-10-07: 25 mg via ORAL
  Filled 2015-10-07: qty 1

## 2015-10-07 MED ORDER — HYDRALAZINE HCL 25 MG PO TABS: 25.0000 mg | ORAL_TABLET | Freq: Three times a day (TID) | ORAL | Status: DC

## 2015-10-07 MED ORDER — SPIRONOLACTONE 25 MG PO TABS: 25.0000 mg | ORAL_TABLET | Freq: Every day | ORAL | Status: DC

## 2015-10-07 MED ORDER — AMIODARONE HCL 400 MG PO TABS: ORAL_TABLET | ORAL | Status: DC

## 2015-10-07 MED ORDER — LISINOPRIL 20 MG PO TABS: 20.0000 mg | ORAL_TABLET | Freq: Every day | ORAL | Status: DC

## 2015-10-07 NOTE — Clinical Social Work Note (Signed)
Per MD, patient is ready to discharge back to [facility]F. RN, Patient/family, and facility have been notified of patient discharge. RN given number for report. D/C packet on chart. Ambulance transport requested for patient. BSW Intern signing off.  Jenita Seashore BSW Intern, [phone]

## 2015-10-07 NOTE — Progress Notes (Signed)
Report received via [place] RN in patient's room using SBAR format, updated on new orders, test results, VS and today's events, assumed care of patient.

## 2015-10-07 NOTE — Plan of Care (Signed)
Problem: Education: Goal: Knowledge of [facility] Education information/materials will improve Outcome: Progressing Reviewed importance of using his call light and  Not to get up without assistance and he was  able to show me how to call me using his call light, instructed that the bed alarm would be on throughout the night and that it would go off to remind him not to get OOB without call for assistance and he verbalized understanding, will continue to reinforce with him.

## 2015-10-07 NOTE — Progress Notes (Signed)
    Subjective:  Feels okay this morning. Denies chest pain, heart palpitations, or shortness of breath  Objective:  Vital Signs in the last 24 hours: Temp:  [97.8 F (36.6 C)-98.6 F (37 C)] 98.6 F (37 C) (02/07 0500) Pulse Rate:  [55-121] 64 (02/07 0500) Resp:  [16-19] 16 (02/06 1949) BP: (121-168)/(87-115) 121/100 mmHg (02/07 0500) SpO2:  [100 %] 100 % (02/07 0500) Weight:  [88.633 kg (195 lb 6.4 oz)] 88.633 kg (195 lb 6.4 oz) (02/07 0500)  Intake/Output from previous day: 02/06 0701 - 02/07 0700 In: 600 [P.O.:600] Out: 875 [Urine:875]  Physical Exam: Pt is awake and alert, NAD HEENT: normal Neck: JVP - normal, carotids 2+= without bruits Lungs: CTA bilaterally CV: Irregularly irregular without murmur or gallop Abd: soft, NT, Positive BS, no hepatomegaly Ext: no C/C/E, distal pulses intact and equal Skin: warm/dry no rash   Lab Results: No results for input(s): WBC, HGB, PLT in the last 72 hours.  Recent Labs  10/05/15 0441 10/06/15 0603  NA 139 140  K 4.0 3.8  CL 106 105  CO2 24 23  GLUCOSE 89 79  BUN 10 12  CREATININE 1.03 1.01   No results for input(s): TROPONINI in the last 72 hours.  Invalid input(s): CK, MB  Cardiac Studies: Myoview Scan: Study Result      There was no ST segment deviation noted during stress.  No T wave inversion was noted during stress.  Findings consistent with prior myocardial infarction.  This is a high risk study.  The left ventricular ejection fraction is severely decreased (<30%).  Small mid and basal inferior wall infarct no ischemia. Global hypokinesis EF 20% No ischemia      Tele: Atrial fibrillation heart rate 80s to 100s  Assessment/Plan:  1. Atrial fibrillation with rapid ventricular response 2. Acute systolic heart failure with LVEF 20-25% 3. Hypertension with heart failure, uncontrolled 4. Type 2 diabetes 5. Schizophrenia  Heart rate is better controlled today. Recommendations as per  yesterday's note. Amiodarone 400 mg twice daily 2 weeks then 200 mg daily. I have reviewed his nuclear scan which shows infarct without ischemia. I think continued medical management of heart failure as indicated. He's on a good program with lisinopril, carvedilol, and spironolactone. However, blood pressure remains uncontrolled. With severe LV dysfunction, seems prudent to add hydralazine 25 mg 3 times daily. Will arrange cardiology follow-up at discharge.   Tonny Bollman, M.D. 10/07/2015, 8:00 AM

## 2015-10-07 NOTE — Discharge Summary (Signed)
Physician Discharge Summary  Jordan Rowe ZOX:[id number] DOB: 1945/10/09 DOA: 09/30/2015  PCP: Pcp Not In System  Admit date: 09/30/2015 Discharge date: 10/07/2015  Time spent: greater than 30 minutes  Recommendations for Outpatient Follow-up:  1. Monitor weights daily 2. Monitor CBG daily 3. Monitor BMET at follow up visit   Discharge Diagnoses:  Principal Problem:   Atrial fibrillation with RVR (HCC) Active Problems:   MENTAL RETARDATION   Essential hypertension   Chest pain   Diabetes mellitus type 2, controlled (HCC)   Schizophrenia (HCC)   Smoker   NSVT (nonsustained ventricular tachycardia) (HCC)   Cardiomyopathy (HCC)   Discharge Condition: stable  Diet recommendation: carbohydrate modified. Heart healthy  Filed Weights   10/05/15 0600 10/06/15 0400 10/07/15 0500  Weight: 89.994 kg (198 lb 6.4 oz) 88.542 kg (195 lb 3.2 oz) 88.633 kg (195 lb 6.4 oz)    History of present illness:  70 y.o. male with history of schizophrenia and mental retarded patient, diabetes mellitus and hypertension was brought from the nursing home after patient was complaining of chest pain. Patient states he has been having chest pain off and on since yesterday. Patient is not exactly able to characterize the chest pain. Patient states the chest pain was in the anterior chest wall. Sometimes associated with shortness of breath. Denies any palpitations. In the ER patient was found to be in A. fib with RVR which is new diagnosis for the patient. Chest x-ray shows congestion. Patient has been started on Cardizem infusion along with heparin and admitted for A. fib with RVR. Patient is presently chest pain-free. Has had one episode of nausea vomiting but abdomen is benign.   Hospital Course:  Admitted to telemetry. Cardiology consulted.   Atrial fibrillation with RVR (HCC): duration unknown. Weaned of cardizem gtt. Started on coreg, which has been uptitrated for heartate and blood pressure. Cardiology  also started amiodarone oral load. Will need 400 mg by mouth twice a day then for a milligrams daily. Heparin drip stopped and patient started on eliquis.. CHADSVASC 3.   Systolic dysfunction: echo with EF 20-25%. New diagnosis. was on ACE inhibitor prior to admission. Lisinopril increased to 20 mg a day. No CHF on exam.  home Lasix dose was continued. Started on spironolactone. Myoview showed no ischemia. Cleared by cardiology for discharge with outpatient follow-up.  NSVT: Electrolytes normal, asymptomatic   MENTAL RETARDATION: lives in ALF. Will go back.    Essential hypertension: blood pressure was high and antihypertensives were adjusted. Also started on hydralazine. Will need outpatient follow-up.    Chest pain: MI ruled out. CTA chest neg for PE   Diabetes mellitus type 2, controlled [facility]): patient had several episodes of hypoglycemia, so Lantus dose was decreased. Have stopped his NovoLog meal coverage. Will need continued monitoring.   Schizophrenia [facility]): has been cooperative and appropriate   Smoker  Procedures:  none  Consultations:  cardiology  Discharge Exam: Filed Vitals:   10/07/15 0500 10/07/15 0818  BP: 121/100 133/93  Pulse: 64 95  Temp: 98.6 F (37 C) 98.6 F (37 C)  Resp:  18    General: a and o Cardiovascular: irreg irreg Respiratory: CTA Ext no CCE  Discharge Instructions   Discharge Instructions    Activity as tolerated - No restrictions    Complete by:  As directed      Amb referral to AFIB Clinic    Complete by:  As directed      Diet - low sodium heart healthy  Complete by:  As directed      Diet Carb Modified    Complete by:  As directed             Medication List    STOP taking these medications        ciprofloxacin 0.3 % ophthalmic ointment  Commonly known as:  CILOXAN     cloNIDine 0.1 MG tablet  Commonly known as:  CATAPRES     lubiprostone 24 MCG capsule  Commonly known as:  AMITIZA     metoprolol  tartrate 25 MG tablet  Commonly known as:  LOPRESSOR     NOVOLOG FLEXPEN 100 UNIT/ML FlexPen  Generic drug:  insulin aspart     NUTRITIONAL DRINK PLUS Liqd     timolol 0.5 % ophthalmic solution  Commonly known as:  TIMOPTIC      TAKE these medications        acetaminophen 325 MG tablet  Commonly known as:  TYLENOL  Take 325-650 mg by mouth every 6 (six) hours as needed for mild pain or moderate pain.     amiodarone 400 MG tablet  Commonly known as:  PACERONE  1 tab po bid for 2 weeks then once daily     apixaban 5 MG Tabs tablet  Commonly known as:  ELIQUIS  Take 1 tablet (5 mg total) by mouth 2 (two) times daily.     brimonidine 0.1 % Soln  Commonly known as:  ALPHAGAN P  Place 1 drop into both eyes 2 (two) times daily.     carvedilol 25 MG tablet  Commonly known as:  COREG  Take 1 tablet (25 mg total) by mouth 2 (two) times daily with a meal.     cholecalciferol 1000 units tablet  Commonly known as:  VITAMIN D  Take 1,000 Units by mouth every morning.     cycloSPORINE 0.05 % ophthalmic emulsion  Commonly known as:  RESTASIS  Place 1 drop into both eyes 2 (two) times daily.     docusate sodium 100 MG capsule  Commonly known as:  COLACE  Take 100 mg by mouth 2 (two) times daily.     ferrous sulfate 325 (65 FE) MG tablet  Take 325 mg by mouth daily with breakfast.     finasteride 5 MG tablet  Commonly known as:  PROSCAR  Take 5 mg by mouth every evening.     furosemide 40 MG tablet  Commonly known as:  LASIX  Take 40 mg by mouth every morning.     hydrALAZINE 25 MG tablet  Commonly known as:  APRESOLINE  Take 1 tablet (25 mg total) by mouth 3 (three) times daily.     insulin glargine 100 UNIT/ML injection  Commonly known as:  LANTUS  Inject 0.05 mLs (5 Units total) into the skin at bedtime. Hold is FSBS is less than 75     isosorbide mononitrate 20 MG tablet  Commonly known as:  ISMO,MONOKET  Take 20 mg by mouth 2 (two) times daily.     latanoprost  0.005 % ophthalmic solution  Commonly known as:  XALATAN  Place 1 drop into both eyes at bedtime.     Linaclotide 145 MCG Caps capsule  Commonly known as:  LINZESS  Take 145 mcg by mouth every morning.     lisinopril 20 MG tablet  Commonly known as:  PRINIVIL,ZESTRIL  Take 1 tablet (20 mg total) by mouth daily.     meloxicam 15 MG tablet  Commonly  known as:  MOBIC  Take 15 mg by mouth daily as needed for pain.     omeprazole 20 MG capsule  Commonly known as:  PRILOSEC  Take 40 mg by mouth every evening. 30 min prior to evening meal.     risperiDONE 2 MG tablet  Commonly known as:  RISPERDAL  Take 2 mg by mouth at bedtime.     senna 8.6 MG Tabs tablet  Commonly known as:  SENOKOT  Take 2 tablets by mouth at bedtime.     spironolactone 25 MG tablet  Commonly known as:  ALDACTONE  Take 1 tablet (25 mg total) by mouth daily.     traMADol 50 MG tablet  Commonly known as:  ULTRAM  Take 50 mg by mouth every 8 (eight) hours as needed for pain. Reported on 09/30/2015       No Known Allergies Follow-up Information    Follow up with [facility]. Go on 10/08/2015.   Specialty:  Cardiology   Why:  Appt with [facility] at 2:30pm on 10/08/15 [facility] [phone]   Contact information:   [address] [id number] mc [place] [address] [phone]       The results of significant diagnostics from this hospitalization (including imaging, microbiology, ancillary and laboratory) are listed below for reference.    Significant Diagnostic Studies: Dg Chest 2 View  09/30/2015  CLINICAL DATA:  Chest pain EXAM: CHEST  2 VIEW COMPARISON:  09/14/2013 chest radiograph. FINDINGS: Stable cardiomediastinal silhouette with mild cardiomegaly. No pneumothorax. Trace left pleural effusion. Mild pulmonary edema. No acute consolidative airspace disease. IMPRESSION: Mild cardiomegaly and mild pulmonary edema, in keeping  with mild congestive heart failure. Trace left pleural effusion. Electronically Signed   By: Delbert Phenix M.D.   On: 09/30/2015 20:03   Ct Angio Chest Pe W/cm &/or Wo Cm  10/01/2015  CLINICAL DATA:  Chest pain, shortness of breath, elevated D-dimer. EXAM: CT ANGIOGRAPHY CHEST WITH CONTRAST TECHNIQUE: Multidetector CT imaging of the chest was performed using the standard protocol during bolus administration of intravenous contrast. Multiplanar CT image reconstructions and MIPs were obtained to evaluate the vascular anatomy. CONTRAST:  100 cc Omnipaque 350. COMPARISON:  Chest radiograph 09/30/2015 FINDINGS: Mediastinum/Lymph Nodes: No pulmonary emboli or thoracic aortic dissection identified. There is right hilar lymphadenopathy measuring 1.9 mm. There is less prominent left hilar peribronchial thickening likely also representing lymphadenopathy. There is bilateral lower lobe peribronchial thickening. No evidence of mediastinal lymphadenopathy. The heart is enlarged. There is reflux of contrast to the hepatic veins, suggestive of right heart failure. Lungs/Pleura: Evaluation of the lungs limited by motion artifact. There is moderate right and smaller left pleural effusion, with associated compressive subsegmental atelectasis of the lung bases. Upper abdomen: No acute findings. Musculoskeletal: No chest wall mass or suspicious bone lesions identified. There is bilateral gynecomastia. Review of the MIP images confirms the above findings. IMPRESSION: No evidence of significant pulmonary embolus, accounting for motion artifact. Right hilar lymphadenopathy. Less prominent left hilar lymphadenopathy. In the absence of evidence of malignancy this likely represents reactive lymphadenopathy. Bilateral lower lobe peribronchial thickening, likely inflammatory. Bilateral pleural effusions, right greater than left. Cardiomegaly, with evidence of right heart failure. Gynecomastia. Electronically Signed   By: Ted Mcalpine  M.D.   On: 10/01/2015 17:25   Nm Myocar Multi W/spect W/wall Motion / Ef  10/06/2015   There was no ST segment deviation noted during stress.  No T wave inversion was  noted during stress.  Findings consistent with prior myocardial infarction.  This is a high risk study.  The left ventricular ejection fraction is severely decreased (<30%).  Small mid and basal inferior wall infarct no ischemia.  Global hypokinesis EF 20%  No ischemia   echo Left ventricle: The cavity size was mildly dilated. Wall thickness was normal. Systolic function was severely reduced. The estimated ejection fraction was in the range of 20% to 25%. Diffuse hypokinesis. Features are consistent with a pseudonormal left ventricular filling pattern, with concomitant abnormal relaxation and increased filling pressure (grade 2 diastolic dysfunction). - Mitral valve: There was mild regurgitation. - Left atrium: The atrium was mildly dilated. - Right ventricle: Systolic function was mildly to moderately reduced. - Right atrium: The atrium was mildly dilated.  Impressions:  - EF is markedly reduced when compared to prior study from 2014 (60%)  Microbiology: Recent Results (from the past 240 hour(s))  MRSA PCR Screening     Status: None   Collection Time: 09/30/15 11:22 PM  Result Value Ref Range Status   MRSA by PCR NEGATIVE NEGATIVE Final    Comment:        The GeneXpert MRSA Assay (FDA approved for NASAL specimens only), is one component of a comprehensive MRSA colonization surveillance program. It is not intended to diagnose MRSA infection nor to guide or monitor treatment for MRSA infections.      Labs: Basic Metabolic Panel:  Recent Labs Lab 09/30/15 1850 10/01/15 1610 10/03/15 0037 10/03/15 0420 10/04/15 0426 10/05/15 0441 10/06/15 0603  NA 145 144  --  141 140 139 140  K 3.8 3.6  --  3.7 4.0 4.0 3.8  CL 106 109  --  107 108 106 105  CO2 26 28  --  GLUCOSE  83 102*  --  71 109* 89 79  BUN 9 10  --  CREATININE 1.05 1.10  --  0.96 0.95 1.03 1.01  CALCIUM 9.2 8.5*  --  8.5* 8.9 8.6* 8.8*  MG 2.0  --  2.0  --   --   --   --   PHOS 3.5  --   --   --   --   --   --    Liver Function Tests:  Recent Labs Lab 10/01/15 0526  AST 22  ALT 45  ALKPHOS 46  BILITOT 1.0  PROT 5.6*  ALBUMIN 3.2*   No results for input(s): LIPASE, AMYLASE in the last 168 hours. No results for input(s): AMMONIA in the last 168 hours. CBC:  Recent Labs Lab 09/30/15 1850 10/01/15 0526 10/02/15 0046  WBC 7.2 8.0 7.6  NEUTROABS  --  3.8  --   HGB 14.8 13.6 13.4  HCT 43.9 40.0 39.4  MCV 93.4 93.2 93.1  PLT 221 202 200   Cardiac Enzymes:  Recent Labs Lab 10/01/15 0004 10/01/15 0526 10/01/15 1144  TROPONINI <0.03 <0.03 <0.03   BNP: BNP (last 3 results)  Recent Labs  10/04/15 0420 10/04/15 1036 10/06/15 0603  BNP 388.2* 428.5* 472.0*    ProBNP (last 3 results) No results for input(s): PROBNP in the last 8760 hours.  CBG:  Recent Labs Lab 10/05/15 2034 10/06/15 0743 10/06/15 1608 10/06/15 2134 10/07/15 0737  GLUCAP 113* 85 158* 86 74       Signed:  Christianne Zacher L MD [facility] 10/07/2015, 10:25 AM

## 2015-10-07 NOTE — Progress Notes (Signed)
Report called to [employer] logistics/support/administrative officer at [facility]. Pt ready for discharge via [facility]. VSS. Belongings with pt.

## 2015-10-08 ENCOUNTER — Inpatient Hospital Stay (HOSPITAL_COMMUNITY)
Admit: 2015-10-08 | Discharge: 2015-10-08 | Disposition: A | Discharge status: Home / Self Care | Payer: Medicare Other | Attending: Nurse Practitioner | Admitting: Nurse Practitioner

## 2015-10-30 ENCOUNTER — Encounter (HOSPITAL_COMMUNITY): Payer: Self-pay | Admitting: Nurse Practitioner

## 2015-10-30 ENCOUNTER — Ambulatory Visit (HOSPITAL_COMMUNITY)
Admission: RE | Admit: 2015-10-30 | Discharge: 2015-10-30 | Disposition: A | Discharge status: Home / Self Care | Payer: Medicare Other | Source: Ambulatory Visit | Attending: Nurse Practitioner | Admitting: Nurse Practitioner

## 2015-10-30 VITALS — BP 116/76 | HR 49 | Ht 69.0 in | Wt 191.8 lb

## 2015-10-30 DIAGNOSIS — Z79899 Other long term (current) drug therapy: Secondary | ICD-10-CM | POA: Insufficient documentation

## 2015-10-30 DIAGNOSIS — Z7902 Long term (current) use of antithrombotics/antiplatelets: Secondary | ICD-10-CM | POA: Diagnosis not present

## 2015-10-30 DIAGNOSIS — Z794 Long term (current) use of insulin: Secondary | ICD-10-CM | POA: Diagnosis not present

## 2015-10-30 DIAGNOSIS — K219 Gastro-esophageal reflux disease without esophagitis: Secondary | ICD-10-CM | POA: Diagnosis not present

## 2015-10-30 DIAGNOSIS — I480 Paroxysmal atrial fibrillation: Secondary | ICD-10-CM

## 2015-10-30 DIAGNOSIS — I10 Essential (primary) hypertension: Secondary | ICD-10-CM | POA: Insufficient documentation

## 2015-10-30 DIAGNOSIS — I4891 Unspecified atrial fibrillation: Secondary | ICD-10-CM | POA: Diagnosis not present

## 2015-10-30 DIAGNOSIS — Z8249 Family history of ischemic heart disease and other diseases of the circulatory system: Secondary | ICD-10-CM | POA: Diagnosis not present

## 2015-10-30 DIAGNOSIS — F17200 Nicotine dependence, unspecified, uncomplicated: Secondary | ICD-10-CM | POA: Insufficient documentation

## 2015-10-30 DIAGNOSIS — F79 Unspecified intellectual disabilities: Secondary | ICD-10-CM | POA: Diagnosis not present

## 2015-10-30 DIAGNOSIS — I519 Heart disease, unspecified: Secondary | ICD-10-CM | POA: Insufficient documentation

## 2015-10-30 DIAGNOSIS — H409 Unspecified glaucoma: Secondary | ICD-10-CM | POA: Diagnosis not present

## 2015-10-30 DIAGNOSIS — F209 Schizophrenia, unspecified: Secondary | ICD-10-CM | POA: Diagnosis not present

## 2015-10-30 DIAGNOSIS — E559 Vitamin D deficiency, unspecified: Secondary | ICD-10-CM | POA: Diagnosis not present

## 2015-10-30 DIAGNOSIS — E119 Type 2 diabetes mellitus without complications: Secondary | ICD-10-CM | POA: Diagnosis not present

## 2015-10-30 LAB — CBC
HEMATOCRIT: 47.1 % (ref 39.0–52.0)
HEMOGLOBIN: 16 g/dL (ref 13.0–17.0)
MCH: 31.7 pg (ref 26.0–34.0)
MCHC: 34 g/dL (ref 30.0–36.0)
MCV: 93.5 fL (ref 78.0–100.0)
Platelets: 184 10*3/uL (ref 150–400)
RBC: 5.04 MIL/uL (ref 4.22–5.81)
RDW: 13.7 % (ref 11.5–15.5)
WBC: 4.8 10*3/uL (ref 4.0–10.5)

## 2015-10-30 LAB — COMPREHENSIVE METABOLIC PANEL
ALK PHOS: 57 U/L (ref 38–126)
ALT: 27 U/L (ref 17–63)
ANION GAP: 11 (ref 5–15)
AST: 19 U/L (ref 15–41)
Albumin: 3.6 g/dL (ref 3.5–5.0)
BILIRUBIN TOTAL: 0.6 mg/dL (ref 0.3–1.2)
BUN: 10 mg/dL (ref 6–20)
CALCIUM: 9.1 mg/dL (ref 8.9–10.3)
CO2: 23 mmol/L (ref 22–32)
CREATININE: 1.23 mg/dL (ref 0.61–1.24)
Chloride: 104 mmol/L (ref 101–111)
GFR, EST NON AFRICAN AMERICAN: 59 mL/min — AB (ref >60)
Glucose, Bld: 184 mg/dL — ABNORMAL HIGH (ref 65–99)
Potassium: 4 mmol/L (ref 3.5–5.1)
SODIUM: 138 mmol/L (ref 135–145)
TOTAL PROTEIN: 6.3 g/dL — AB (ref 6.5–8.1)

## 2015-10-30 LAB — TSH: TSH: 0.685 u[IU]/mL (ref 0.350–4.500)

## 2015-10-30 MED ORDER — AMIODARONE HCL 200 MG PO TABS: 200.0000 mg | ORAL_TABLET | Freq: Every day | ORAL | Status: DC

## 2015-10-30 NOTE — Progress Notes (Signed)
Patient ID: Jordan Rowe, male   DOB: [birth year], 70 y.o.   MRN: [id number]     Primary Care Physician: Pcp Not In System Referring Physician: [facility] f/u   Jordan Rowe is a 70 y.o. male with a h/o 70 y.o. male with history of schizophrenia and mental retarded patient, diabetes mellitus and hypertension was brought from the nursing home after patient was complaining of chest pain. Patient states he has been having chest pain off and on since yesterday. Patient is not exactly able to characterize the chest pain. Patient states the chest pain was in the anterior chest wall. Sometimes associated with shortness of breath. Denies any palpitations. In the ER patient was found to be in A. fib with RVR which is new diagnosis for the patient. Chest x-ray shows congestion. Patient has been started on Cardizem infusion along with heparin and admitted for A. fib with RVR. Patient is presently chest pain-free. Has had one episode of nausea vomiting but abdomen is benign  Atrial fibrillation with RVR (HCC): duration unknown. Weaned of cardizem gtt. Started on coreg, which has been uptitrated for heartate and blood pressure. Cardiology also started amiodarone oral load. Will need 400 mg by mouth twice a day then for a milligrams daily. Heparin drip stopped and patient started on eliquis.. CHADSVASC 3.   Systolic dysfunction: echo with EF 20-25%. New diagnosis. was on ACE inhibitor prior to admission. Lisinopril increased to 20 mg a day.blood pressure was high and antihypertensives were adjusted. Also started on hydralazine No CHF on exam.Home Lasix dose was continued. Started on spironolactone. Myoview showed no ischemia. Cleared by cardiology for discharge with outpatient follow-up.  He is brought in to the afib clinic for f/u but is without attendant. Pt can give simple answers to questions when asked but no complex answers and definitely is not a good historian.   Today, he denies symptoms of palpitations,  chest pain, shortness of breath, orthopnea, PND, lower extremity edema, dizziness, presyncope, syncope, or neurologic sequela. The patient is tolerating medications without difficulties and is otherwise without complaint today.   Past Medical History  Diagnosis Date  . Hypertension   . GERD (gastroesophageal reflux disease)   . Vitamin D deficiency   . Constipation   . Schizophrenia (HCC)   . Seizure (HCC)   . Edema   . Glaucoma   . Mental retardation     Jordan Rowe 09/30/2015  . Atrial fibrillation with RVR (HCC)     Jordan Rowe 09/30/2015  . Type II diabetes mellitus (HCC)     Jordan Rowe 09/30/2015  . BPH (benign prostatic hypertrophy)     Jordan Rowe 09/30/2015   Past Surgical History  Procedure Laterality Date  . Multiple tooth extractions    . Cataract extraction Left 12/2002    Jordan Rowe 01/12/2011  . Cataract extraction w/ intraocular lens implant Right 01/2011    Jordan Rowe 02/18/2011    Current Outpatient Prescriptions  Medication Sig Dispense Refill  . acetaminophen (TYLENOL) 325 MG tablet Take 325-650 mg by mouth every 6 (six) hours as needed for mild pain or moderate pain.     Marland Kitchen amiodarone (PACERONE) 200 MG tablet Take 1 tablet (200 mg total) by mouth daily.    Marland Kitchen apixaban (ELIQUIS) 5 MG TABS tablet Take 1 tablet (5 mg total) by mouth 2 (two) times daily. 60 tablet 0  . brimonidine (ALPHAGAN P) 0.1 % SOLN Place 1 drop into both eyes 2 (two) times daily.    . carvedilol (COREG) 25 MG tablet Take 1  tablet (25 mg total) by mouth 2 (two) times daily with a meal. 60 tablet 1  . cholecalciferol (VITAMIN D) 1000 UNITS tablet Take 1,000 Units by mouth every morning.     . cycloSPORINE (RESTASIS) 0.05 % ophthalmic emulsion Place 1 drop into both eyes 2 (two) times daily.     Marland Kitchen docusate sodium (COLACE) 100 MG capsule Take 100 mg by mouth 2 (two) times daily.    . ferrous sulfate 325 (65 FE) MG tablet Take 325 mg by mouth daily with breakfast.    . finasteride (PROSCAR) 5 MG tablet Take 5 mg by mouth every  evening.     . furosemide (LASIX) 40 MG tablet Take 40 mg by mouth every morning.     . hydrALAZINE (APRESOLINE) 25 MG tablet Take 1 tablet (25 mg total) by mouth 3 (three) times daily. 90 tablet 1  . insulin glargine (LANTUS) 100 UNIT/ML injection Inject 0.05 mLs (5 Units total) into the skin at bedtime. Hold is FSBS is less than 75 10 mL 11  . isosorbide mononitrate (ISMO,MONOKET) 20 MG tablet Take 20 mg by mouth 2 (two) times daily.    Marland Kitchen latanoprost (XALATAN) 0.005 % ophthalmic solution Place 1 drop into both eyes at bedtime.    . Linaclotide (LINZESS) 145 MCG CAPS capsule Take 145 mcg by mouth every morning.     Marland Kitchen lisinopril (PRINIVIL,ZESTRIL) 20 MG tablet Take 1 tablet (20 mg total) by mouth daily. 30 tablet 1  . meloxicam (MOBIC) 15 MG tablet Take 15 mg by mouth daily as needed for pain.    Marland Kitchen omeprazole (PRILOSEC) 20 MG capsule Take 40 mg by mouth every evening. 30 min prior to evening meal.    . risperiDONE (RISPERDAL) 2 MG tablet Take 2 mg by mouth at bedtime.    . senna (SENOKOT) 8.6 MG TABS Take 2 tablets by mouth at bedtime.    Marland Kitchen spironolactone (ALDACTONE) 25 MG tablet Take 1 tablet (25 mg total) by mouth daily. 30 tablet 1  . traMADol (ULTRAM) 50 MG tablet Take 50 mg by mouth every 8 (eight) hours as needed for pain. Reported on 09/30/2015     No current facility-administered medications for this encounter.    No Known Allergies  Social History   Social History  . Marital Status: Divorced    Spouse Name: N/A  . Number of Children: N/A  . Years of Education: N/A   Occupational History  . Not on file.   Social History Main Topics  . Smoking status: Current Every Day Smoker  . Smokeless tobacco: Not on file  . Alcohol Use: No  . Drug Use: No  . Sexual Activity: Not on file   Other Topics Concern  . Not on file   Social History Narrative    Family History  Problem Relation Age of Onset  . Hypertension Mother     ROS- All systems are reviewed and negative except  as per the HPI above  Physical Exam: Filed Vitals:   10/30/15 1340  BP: 116/76  Pulse: 49  Height:  (1.753 m)  Weight: 191 lb 12.8 oz (87 kg)    GEN- The patient is well appearing, alert and oriented x 3 today.   Head- normocephalic, atraumatic Eyes-  Sclera clear, conjunctiva pink Ears- hearing intact Oropharynx- clear Neck- supple, no JVP Lymph- no cervical lymphadenopathy Lungs- Clear to ausculation bilaterally, normal work of breathing Heart- Regular rate and rhythm, no murmurs, rubs or gallops, PMI not laterally  displaced GI- soft, NT, ND, + BS Extremities- no clubbing, cyanosis, or edema MS- no significant deformity or atrophy Skin- no rash or lesion Psych- euthymic mood, full affect Neuro- strength and sensation are intact  EKG-Unusual P axis, possible ectopic atrial  Bradycardia, pr int 122 ms, qrs int 82 bpm, qtc 446 ms Epic records reviewed  Assessment and Plan: 1. New onset afib Appears in be in a regular rhythm Amiodarone at 200 mg a day Pt is not symptomatic with HR of 50 Tsh, liver panel  2. LV dysfunction Continue spironolactone, Lisinopril, carvedilol, lasix Fluid level stable bmet  3. HTN Stable  4. Chadsvasc score of 3 Continue eliquis 5 mg bid cbc  F/u in one month  Zaydenn Balaguer [facility] [address] [place], [place] [address] [phone]

## 2015-12-03 ENCOUNTER — Encounter (HOSPITAL_COMMUNITY): Payer: Self-pay | Admitting: Nurse Practitioner

## 2015-12-03 ENCOUNTER — Ambulatory Visit (HOSPITAL_COMMUNITY)
Admission: RE | Admit: 2015-12-03 | Discharge: 2015-12-03 | Disposition: A | Discharge status: Home / Self Care | Payer: Medicare Other | Source: Ambulatory Visit | Attending: Nurse Practitioner | Admitting: Nurse Practitioner

## 2015-12-03 VITALS — BP 124/78 | HR 49 | Ht 69.0 in | Wt 187.4 lb

## 2015-12-03 DIAGNOSIS — E559 Vitamin D deficiency, unspecified: Secondary | ICD-10-CM | POA: Insufficient documentation

## 2015-12-03 DIAGNOSIS — I4891 Unspecified atrial fibrillation: Secondary | ICD-10-CM | POA: Diagnosis present

## 2015-12-03 DIAGNOSIS — H409 Unspecified glaucoma: Secondary | ICD-10-CM | POA: Insufficient documentation

## 2015-12-03 DIAGNOSIS — I5189 Other ill-defined heart diseases: Secondary | ICD-10-CM | POA: Diagnosis not present

## 2015-12-03 DIAGNOSIS — F79 Unspecified intellectual disabilities: Secondary | ICD-10-CM | POA: Diagnosis not present

## 2015-12-03 DIAGNOSIS — E119 Type 2 diabetes mellitus without complications: Secondary | ICD-10-CM | POA: Diagnosis not present

## 2015-12-03 DIAGNOSIS — F209 Schizophrenia, unspecified: Secondary | ICD-10-CM | POA: Insufficient documentation

## 2015-12-03 DIAGNOSIS — Z79899 Other long term (current) drug therapy: Secondary | ICD-10-CM | POA: Diagnosis not present

## 2015-12-03 DIAGNOSIS — I480 Paroxysmal atrial fibrillation: Secondary | ICD-10-CM

## 2015-12-03 DIAGNOSIS — K219 Gastro-esophageal reflux disease without esophagitis: Secondary | ICD-10-CM | POA: Diagnosis not present

## 2015-12-03 DIAGNOSIS — Z794 Long term (current) use of insulin: Secondary | ICD-10-CM | POA: Insufficient documentation

## 2015-12-03 DIAGNOSIS — Z8249 Family history of ischemic heart disease and other diseases of the circulatory system: Secondary | ICD-10-CM | POA: Insufficient documentation

## 2015-12-03 DIAGNOSIS — I10 Essential (primary) hypertension: Secondary | ICD-10-CM | POA: Diagnosis not present

## 2015-12-03 DIAGNOSIS — F17200 Nicotine dependence, unspecified, uncomplicated: Secondary | ICD-10-CM | POA: Diagnosis not present

## 2015-12-03 DIAGNOSIS — Z7901 Long term (current) use of anticoagulants: Secondary | ICD-10-CM | POA: Diagnosis not present

## 2015-12-03 NOTE — Progress Notes (Signed)
Patient ID: Jordan Rowe, male   DOB: 15-Apr-1946, 70 y.o.   MRN: [id number]     Primary Care Physician: Pcp Not In System Referring Physician: [facility] f/u   Jordan Rowe is a 70 y.o. male with a h/o 70 y.o. male with history of schizophrenia and mental retardation, diabetes mellitus and hypertension was brought from the nursing home after patient was complaining of chest pain. Patient stated he has been having chest pain off and on for 2 days. Patient was not exactly able to characterize the chest pain. Patient stated the chest pain was in the anterior chest wall. Sometime associated with shortness of breath. Denies any palpitations. In the ER patient was found to be in A. fib with RVR which was a new diagnosis for the patient. Chest x-ray showed congestion. Patient was  started on Cardizem infusion along with heparin and admitted for A. fib with RVR. Patient was  chest pain-free on exam. Has had one episode of nausea vomiting but abdomen is benign  Atrial fibrillation with RVR (HCC): duration unknown. Weaned of cardizem gtt. Started on coreg, which was uptitrated for heartate and blood pressure. Cardiology also started amiodarone oral load.  Heparin drip stopped and patient started on eliquis.. CHADSVASC 3.   Systolic dysfunction: echo with EF 20-25%. New diagnosis, was on ACE inhibitor prior to admission. Lisinopril increased to 20 mg a day. Blood pressure was high and antihypertensives were adjusted. Also started on hydralazine No CHF on exam.Home lasix dose was continued. Started on spironolactone. Myoview showed no ischemia. Cleared by cardiology for discharge with outpatient follow-up.  F/u in  afib clinic 3/2,t is without attendant. Pt can give simple answers to questions when asked but no complex answers and definitely is not a good historian.   F/u in afib clinic 4/4. He is unattended. He denies any chest pain or shortness of breath. Answers "no"  to any irregular heart beat. Has a heart  rate around 50 but tolerates, same as last visit and again answers "no" to any tendency to being lightheaded, presyncopal, dizzy. Overall no complaints. Denies "no" to any bleeding issues.  Today, he denies symptoms of palpitations, chest pain, shortness of breath, orthopnea, PND, lower extremity edema, dizziness, presyncope, syncope, or neurologic sequela. The patient is tolerating medications without difficulties and is otherwise without complaint today.   Past Medical History  Diagnosis Date  . Hypertension   . GERD (gastroesophageal reflux disease)   . Vitamin D deficiency   . Constipation   . Schizophrenia (HCC)   . Seizure (HCC)   . Edema   . Glaucoma   . Mental retardation     [name] 09/30/2015  . Atrial fibrillation with RVR (HCC)     [name] 09/30/2015  . Type II diabetes mellitus (HCC)     [name] 09/30/2015  . BPH (benign prostatic hypertrophy)     [name] 09/30/2015   Past Surgical History  Procedure Laterality Date  . Multiple tooth extractions    . Cataract extraction Left 12/2002    [name] 01/12/2011  . Cataract extraction w/ intraocular lens implant Right 01/2011    [name] 02/18/2011    Current Outpatient Prescriptions  Medication Sig Dispense Refill  . acetaminophen (TYLENOL) 325 MG tablet Take 325-650 mg by mouth every 6 (six) hours as needed for mild pain or moderate pain.     Marland Kitchen amiodarone (PACERONE) 200 MG tablet Take 1 tablet (200 mg total) by mouth daily.    Marland Kitchen apixaban (ELIQUIS) 5 MG TABS tablet  Take 1 tablet (5 mg total) by mouth 2 (two) times daily. 60 tablet 0  . brimonidine (ALPHAGAN P) 0.1 % SOLN Place 1 drop into both eyes 2 (two) times daily.    . carvedilol (COREG) 25 MG tablet Take 1 tablet (25 mg total) by mouth 2 (two) times daily with a meal. 60 tablet 1  . cholecalciferol (VITAMIN D) 1000 UNITS tablet Take 1,000 Units by mouth every morning.     . cycloSPORINE (RESTASIS) 0.05 % ophthalmic emulsion Place 1 drop into both eyes 2 (two) times daily.     Marland Kitchen  docusate sodium (COLACE) 100 MG capsule Take 100 mg by mouth 2 (two) times daily.    . ferrous sulfate 325 (65 FE) MG tablet Take 325 mg by mouth daily with breakfast.    . finasteride (PROSCAR) 5 MG tablet Take 5 mg by mouth every evening.     . furosemide (LASIX) 40 MG tablet Take 20 mg by mouth every morning.     . hydrALAZINE (APRESOLINE) 25 MG tablet Take 1 tablet (25 mg total) by mouth 3 (three) times daily. 90 tablet 1  . insulin glargine (LANTUS) 100 UNIT/ML injection Inject 0.05 mLs (5 Units total) into the skin at bedtime. Hold is FSBS is less than 75 10 mL 11  . isosorbide mononitrate (ISMO,MONOKET) 20 MG tablet Take 20 mg by mouth 2 (two) times daily.    Marland Kitchen latanoprost (XALATAN) 0.005 % ophthalmic solution Place 1 drop into both eyes at bedtime.    . Linaclotide (LINZESS) 145 MCG CAPS capsule Take 145 mcg by mouth every morning.     Marland Kitchen lisinopril (PRINIVIL,ZESTRIL) 20 MG tablet Take 1 tablet (20 mg total) by mouth daily. 30 tablet 1  . meloxicam (MOBIC) 15 MG tablet Take 15 mg by mouth daily as needed for pain.    Marland Kitchen omeprazole (PRILOSEC) 20 MG capsule Take 40 mg by mouth every evening. 30 min prior to evening meal.    . risperiDONE (RISPERDAL) 2 MG tablet Take 2 mg by mouth at bedtime.    . senna (SENOKOT) 8.6 MG TABS Take 2 tablets by mouth at bedtime.    Marland Kitchen spironolactone (ALDACTONE) 25 MG tablet Take 1 tablet (25 mg total) by mouth daily. 30 tablet 1  . traMADol (ULTRAM) 50 MG tablet Take 50 mg by mouth every 8 (eight) hours as needed for pain. Reported on 09/30/2015     No current facility-administered medications for this encounter.    No Known Allergies  Social History   Social History  . Marital Status: Divorced    Spouse Name: N/A  . Number of Children: N/A  . Years of Education: N/A   Occupational History  . Not on file.   Social History Main Topics  . Smoking status: Current Every Day Smoker  . Smokeless tobacco: Not on file  . Alcohol Use: No  . Drug Use: No    . Sexual Activity: Not on file   Other Topics Concern  . Not on file   Social History Narrative    Family History  Problem Relation Age of Onset  . Hypertension Mother     ROS- All systems are reviewed and negative except as per the HPI above  Physical Exam: Filed Vitals:   12/03/15 1346  BP: 124/78  Pulse: 49  Height: 5\' 9"  (1.753 m)  Weight: 187 lb 6.4 oz (85.004 kg)    GEN- The patient is well appearing, alert and oriented x 3 today.  Head- normocephalic, atraumatic Eyes-  Sclera clear, conjunctiva pink Ears- hearing intact Oropharynx- clear Neck- supple, no JVP Lymph- no cervical lymphadenopathy Lungs- Clear to ausculation bilaterally, normal work of breathing Heart- Regular rate and rhythm, no murmurs, rubs or gallops, PMI not laterally displaced GI- soft, NT, ND, + BS Extremities- no clubbing, cyanosis, or edema MS- no significant deformity or atrophy Skin- no rash or lesion Psych- euthymic mood, full affect Neuro- strength and sensation are intact  EKG-Sinus brady at 49 bpm, Pr int 146 ms, qrs int 82 ms, qtc 460 ms Epic records reviewed  Assessment and Plan: 1.  afib Appears in be in a regular rhythm Amiodarone at 200 mg a day Pt is not symptomatic with HR of 50 Tsh, CMET panel drawn on last visit and stable for amiodarone   2. LV dysfunction Continue spironolactone, Lisinopril, carvedilol, lasix Fluid level stable, weight done 4 lbs from last visit  3. HTN Stable  4. Chadsvasc score of 3 Continue eliquis 5 mg bid   F/u in two months Consider repeating echo on f/u for improvement in EF with SR  Aamna Mallozzi C. Matthew Folks [facility] [address] [place], [place] [address] [phone]

## 2016-01-18 ENCOUNTER — Encounter (HOSPITAL_COMMUNITY): Payer: Self-pay | Admitting: *Deleted

## 2016-01-18 ENCOUNTER — Emergency Department (HOSPITAL_COMMUNITY)
Admission: EM | Admit: 2016-01-18 | Discharge: 2016-01-18 | Disposition: A | Discharge status: Home / Self Care | Payer: Medicare Other | Attending: Emergency Medicine | Admitting: Emergency Medicine

## 2016-01-18 ENCOUNTER — Emergency Department (HOSPITAL_COMMUNITY): Payer: Medicare Other

## 2016-01-18 DIAGNOSIS — W1830XA Fall on same level, unspecified, initial encounter: Secondary | ICD-10-CM | POA: Insufficient documentation

## 2016-01-18 DIAGNOSIS — Y999 Unspecified external cause status: Secondary | ICD-10-CM | POA: Insufficient documentation

## 2016-01-18 DIAGNOSIS — E119 Type 2 diabetes mellitus without complications: Secondary | ICD-10-CM | POA: Diagnosis not present

## 2016-01-18 DIAGNOSIS — Z794 Long term (current) use of insulin: Secondary | ICD-10-CM | POA: Diagnosis not present

## 2016-01-18 DIAGNOSIS — Y929 Unspecified place or not applicable: Secondary | ICD-10-CM | POA: Diagnosis not present

## 2016-01-18 DIAGNOSIS — Z79899 Other long term (current) drug therapy: Secondary | ICD-10-CM | POA: Insufficient documentation

## 2016-01-18 DIAGNOSIS — I4891 Unspecified atrial fibrillation: Secondary | ICD-10-CM | POA: Insufficient documentation

## 2016-01-18 DIAGNOSIS — F17200 Nicotine dependence, unspecified, uncomplicated: Secondary | ICD-10-CM | POA: Insufficient documentation

## 2016-01-18 DIAGNOSIS — I10 Essential (primary) hypertension: Secondary | ICD-10-CM | POA: Insufficient documentation

## 2016-01-18 DIAGNOSIS — Z7901 Long term (current) use of anticoagulants: Secondary | ICD-10-CM | POA: Insufficient documentation

## 2016-01-18 DIAGNOSIS — W19XXXA Unspecified fall, initial encounter: Secondary | ICD-10-CM

## 2016-01-18 DIAGNOSIS — S52502A Unspecified fracture of the lower end of left radius, initial encounter for closed fracture: Secondary | ICD-10-CM | POA: Insufficient documentation

## 2016-01-18 DIAGNOSIS — S6992XA Unspecified injury of left wrist, hand and finger(s), initial encounter: Secondary | ICD-10-CM | POA: Diagnosis present

## 2016-01-18 DIAGNOSIS — Y939 Activity, unspecified: Secondary | ICD-10-CM | POA: Diagnosis not present

## 2016-01-18 MED ORDER — ACETAMINOPHEN 325 MG PO TABS
650.0000 mg | ORAL_TABLET | Freq: Once | ORAL | Status: AC
  Administered 2016-01-18: 650 mg via ORAL
  Filled 2016-01-18: qty 2

## 2016-01-18 MED ORDER — HYDROCODONE-ACETAMINOPHEN 5-325 MG PO TABS: 1.0000 | ORAL_TABLET | Freq: Four times a day (QID) | ORAL | Status: DC | PRN

## 2016-01-18 NOTE — ED Notes (Addendum)
Pt in via [facility] from [facility], pt c/o L wrist pain onset yesterday post fall while ambulating with walker, declined eval yesterday, c/o of increased 10/120 pain today, present to ED with immobilizer in place, swelling present, pt able to wiggle fingers, extremity warm to touch, A&O x4, pt denies hitting head & -LOC

## 2016-01-18 NOTE — Progress Notes (Signed)
Orthopedic Tech Progress Note Patient Details:  Jordan Rowe 29-Sep-1945 [id number]  Ortho Devices Type of Ortho Device: Ace wrap, Arm sling, Sugartong splint Ortho Device/Splint Interventions: Application   Saul Fordyce 01/18/2016, 12:25 PM

## 2016-01-18 NOTE — ED Provider Notes (Signed)
CSN: [id number]     Arrival date & time 01/18/16  0902 History   First MD Initiated Contact with Patient 01/18/16 [phone]     Chief Complaint  Patient presents with  . Fall  . Wrist Pain     (Consider location/radiation/quality/duration/timing/severity/associated sxs/prior Treatment) HPI   Jordan Rowe is a 70 y.o. male with PMH significant for hypertension, GERD schizophrenia, seizure, mental retardation, atrial fibrillation on Eliquis, diabetes, and BPH who presents via EMS from [facility] with fall. Patient reports he was ambulating with his walker yesterday when it got out from under him causing him to fall forward landing on his left wrist. He is now complaining of nonradiating, constant, moderate left wrist pain that is worse with movement. He denies LOC, head injury, numbness, weakness. He denies any preceding dizziness, shortness of breath, chest pain, or syncope. No medications prior to arrival.  Past Medical History  Diagnosis Date  . Hypertension   . GERD (gastroesophageal reflux disease)   . Vitamin D deficiency   . Constipation   . Schizophrenia (HCC)   . Seizure (HCC)   . Edema   . Glaucoma   . Mental retardation     [name] 09/30/2015  . Atrial fibrillation with RVR (HCC)     [name] 09/30/2015  . Type II diabetes mellitus (HCC)     [name] 09/30/2015  . BPH (benign prostatic hypertrophy)     [name] 09/30/2015   Past Surgical History  Procedure Laterality Date  . Multiple tooth extractions    . Cataract extraction Left 12/2002    [name] 01/12/2011  . Cataract extraction w/ intraocular lens implant Right 01/2011    [name] 02/18/2011   Family History  Problem Relation Age of Onset  . Hypertension Mother    Social History  Substance Use Topics  . Smoking status: Current Every Day Smoker  . Smokeless tobacco: None  . Alcohol Use: No    Review of Systems All other systems negative unless otherwise stated in HPI    Allergies  Review of patient's allergies  indicates no known allergies.  Home Medications   Prior to Admission medications   Medication Sig Start Date End Date Taking? Authorizing Provider  acetaminophen (TYLENOL) 325 MG tablet Take 325-650 mg by mouth every 6 (six) hours as needed for mild pain or moderate pain.    Yes Historical Provider, MD  amiodarone (PACERONE) 200 MG tablet Take 1 tablet (200 mg total) by mouth daily. 10/30/15  Yes Newman Nip, NP  apixaban (ELIQUIS) 5 MG TABS tablet Take 1 tablet (5 mg total) by mouth 2 (two) times daily. 10/07/15  Yes Christiane Ha, MD  brinzolamide (AZOPT) 1 % ophthalmic suspension Place 1 drop into both eyes 2 (two) times daily.   Yes Historical Provider, MD  carvedilol (COREG) 25 MG tablet Take 1 tablet (25 mg total) by mouth 2 (two) times daily with a meal. 10/07/15  Yes Christiane Ha, MD  cholecalciferol (VITAMIN D) 1000 UNITS tablet Take 1,000 Units by mouth every morning.    Yes Historical Provider, MD  cycloSPORINE (RESTASIS) 0.05 % ophthalmic emulsion Place 1 drop into both eyes 2 (two) times daily.    Yes Historical Provider, MD  docusate sodium (COLACE) 100 MG capsule Take 100 mg by mouth 2 (two) times daily.   Yes Historical Provider, MD  ferrous sulfate 325 (65 FE) MG tablet Take 325 mg by mouth daily with breakfast.   Yes Historical Provider, MD  finasteride (PROSCAR) 5 MG  tablet Take 5 mg by mouth every evening.    Yes Historical Provider, MD  furosemide (LASIX) 20 MG tablet Take 20 mg by mouth daily.   Yes Historical Provider, MD  hydrALAZINE (APRESOLINE) 25 MG tablet Take 1 tablet (25 mg total) by mouth 3 (three) times daily. 10/07/15  Yes Christiane Ha, MD  insulin glargine (LANTUS) 100 UNIT/ML injection Inject 0.05 mLs (5 Units total) into the skin at bedtime. Hold is FSBS is less than 75 10/07/15  Yes Christiane Ha, MD  isosorbide mononitrate (ISMO,MONOKET) 20 MG tablet Take 20 mg by mouth 2 (two) times daily.   Yes Historical Provider, MD  latanoprost (XALATAN)  0.005 % ophthalmic solution Place 1 drop into both eyes at bedtime.   Yes Historical Provider, MD  Linaclotide (LINZESS) 145 MCG CAPS capsule Take 145 mcg by mouth every morning.    Yes Historical Provider, MD  lisinopril (PRINIVIL,ZESTRIL) 20 MG tablet Take 1 tablet (20 mg total) by mouth daily. 10/07/15  Yes Christiane Ha, MD  meloxicam (MOBIC) 15 MG tablet Take 15 mg by mouth daily as needed for pain.   Yes Historical Provider, MD  omeprazole (PRILOSEC) 20 MG capsule Take 40 mg by mouth every evening. 30 min prior to evening meal.   Yes Historical Provider, MD  Polyvinyl Alcohol-Povidone (REFRESH OP) Place 1 drop into both eyes 2 (two) times daily.   Yes Historical Provider, MD  risperiDONE (RISPERDAL) 2 MG tablet Take 2 mg by mouth at bedtime.   Yes Historical Provider, MD  senna (SENOKOT) 8.6 MG TABS Take 2 tablets by mouth at bedtime.   Yes Historical Provider, MD  spironolactone (ALDACTONE) 25 MG tablet Take 1 tablet (25 mg total) by mouth daily. 10/07/15  Yes Christiane Ha, MD  traMADol (ULTRAM) 50 MG tablet Take 50 mg by mouth every 8 (eight) hours as needed for pain. Reported on 09/30/2015   Yes Historical Provider, MD   BP 124/72 mmHg  Pulse 50  Temp(Src) 97.8 F (36.6 C) (Oral)  Resp 18  SpO2 100% Physical Exam  Constitutional: He is oriented to person, place, and time. He appears well-developed and well-nourished.  Non-toxic appearance. He does not have a sickly appearance. He does not appear ill.  HENT:  Head: Normocephalic and atraumatic.  Mouth/Throat: Oropharynx is clear and moist.  No signs of trauma.  Eyes: Conjunctivae are normal.  Neck: Normal range of motion. Neck supple.  No cervical midline tenderness. No spinous process tenderness. No step-offs or crepitus.  Cardiovascular: Normal rate and regular rhythm.   Pulses:      Radial pulses are 2+ on the right side, and 2+ on the left side.  Capillary refill brisk.  Pulmonary/Chest: Effort normal and breath sounds  normal. No accessory muscle usage or stridor. No respiratory distress. He has no wheezes. He has no rhonchi. He has no rales.  Abdominal: Soft. Bowel sounds are normal. He exhibits no distension. There is no tenderness.  Musculoskeletal: Normal range of motion. He exhibits tenderness.  Left wrist with moderate swelling. No bruising or erythema. Tender to palpation over dorsal aspect of distal radius and ulna. He does have anatomical snuffbox tenderness. Diffuse tenderness over the metacarpals. Full passive range of motion with pain. Decreased active range of motion due to pain. Compartment is soft and compressible.  Lymphadenopathy:    He has no cervical adenopathy.  Neurological: He is alert and oriented to person, place, and time.  Decreased strength of left wrist due to  pain. 4 out of 5 grip strength, decreased due to pain. 5 out of 5 grip strength and wrist strength on the left. Sensation intact bilaterally.  Skin: Skin is warm and dry.  Psychiatric: He has a normal mood and affect. His behavior is normal.    ED Course  Procedures (including critical care time) Labs Review Labs Reviewed - No data to display  Imaging Review Dg Wrist Complete Left  01/18/2016  CLINICAL DATA:  Fall with left radial wrist pain EXAM: LEFT WRIST - COMPLETE 3+ VIEW COMPARISON:  None. FINDINGS: Nondisplaced intra-articular fracture of the radial aspect of the distal left radius with surrounding soft tissue swelling. No additional fracture. No dislocation or suspicious focal osseous lesion. No appreciable arthropathy. No radiopaque foreign body. IMPRESSION: Nondisplaced intra-articular left distal radius fracture. Electronically Signed   By: Delbert Phenix M.D.   On: 01/18/2016 10:33   Dg Hand Complete Left  01/18/2016  CLINICAL DATA:  Left hand/wrist pain after fall EXAM: LEFT HAND - COMPLETE 3+ VIEW COMPARISON:  None. FINDINGS: No fracture, dislocation or suspicious focal osseous lesion in the left hand. 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 No radiopaque foreign body. IMPRESSION: No fracture or malalignment in the left hand. Please see the separate concurrent left wrist radiograph report for the findings in the left wrist. Electronically Signed   By: Delbert Phenix M.D.   On: 01/18/2016 10:35   I have personally reviewed and evaluated these images and lab results as part of my medical decision-making.   EKG Interpretation None      MDM   Final diagnoses:  Distal radius fracture, left, closed, initial encounter  Fall, initial encounter   Patient presents with mechanical fall. No head injury or loss of consciousness. No preceding chest pain, shortness of breath, syncope, dizziness, or lightheadedness. VSS, NAD. On exam, moderate swelling. Compartment soft and compressible. TTP over distal radius and ulna. He does have anatomical snuff box tenderness as well as diffuse tenderness over the metacarpals. Neurovascularly intact. Will obtain plain films of the left wrist and hand. Tylenol for pain.  Plain films remarkable for nondisplaced intra-articular left distal radius fracture.  Will consult hand surgery, appreciate Dr. Izora Ribas. Will place in sugar tong splint.  Follow up with Dr. Debby Bud office.   Discussed return precautions.  Patient agrees and acknowledges the above plan for discharge.   Case has been discussed with Dr. Rosalia Hammers who agrees with the above plan for discharge.      Cheri Fowler, PA-C 01/18/16 4503  Margarita Grizzle, MD 01/18/16 [phone]

## 2016-01-18 NOTE — ED Notes (Signed)
Ortho Tech paged for splint

## 2016-01-18 NOTE — Discharge Instructions (Signed)
Your xray showed a non-displaced distal radial fracture.  We placed you in a splint.  Please follow up with hand surgery within the the next 3-5 days.  Return to the ED if you experience increased swelling, color changes, or numbness.  Radial Fracture A radial fracture is a break in the radius bone, which is the long bone of the forearm that is on the same side as your thumb. Your forearm is the part of your arm that is between your elbow and your wrist. It is made up of two bones: the radius and the ulna. Most radial fractures occur near the wrist (distal radialfracture) or near the elbow (radial head fracture). A distal radial fracture is the most common type of broken arm. This fracture usually occurs about an inch above the wrist. Fractures of the middle part of the bone are less common. CAUSES  Falling with your arm outstretched is the most common cause of a radial fracture. Other causes include:  Car accidents.  Bike accidents.  A direct blow to the middle part of the radius. RISK FACTORS  You may be at greater risk for a distal radial fracture if you are 40 years of age or older.  You may be at greater risk for a radial head fracture if you are:  Male.  74-49 years old.  You may be at a greater risk for all types of radial fractures if you have a condition that causes your bones to be weak or thin (osteoporosis). SIGNS AND SYMPTOMS A radial fracture causes pain immediately after the injury. Other signs and symptoms include:  An abnormal bend or bump in your arm (deformity).  Swelling.  Bruising.  Numbness or tingling.  Tenderness.  Limited movement. DIAGNOSIS  Your health care provider may diagnose a radial fracture based on:  Your symptoms.  Your medical history, including any recent injury.  A physical exam. Your health care provider will look for any deformity and feel for tenderness over the break. Your health care provider will also check whether the bone  is out of place.  An X-ray exam to confirm the diagnosis and learn more about the type of fracture. TREATMENT The goals of treatment are to get the bone in proper position for healing and to keep it from moving so it will heal over time. Your treatment will depend on many factors, especially the type of fracture that you have.  If the fractured bone:  Is in the correct position (nondisplaced), you may only need to wear a cast or a splint.  Has a slightly displaced fracture, you may need to have the bones moved back into place manually (closed reduction) before the splint or cast is put on.  You may have a temporary splint before you have a plaster cast. The splint allows room for some swelling. After a few days, a cast can replace the splint.  You may have to wear the cast for about 6 weeks or as directed by your health care provider.  The cast may be changed after about 3 weeks or as directed by your health care provider.  After your cast is taken off, you may need physical therapy to regain full movement in your wrist or elbow.  You may need emergency surgery if you have:  A fractured bone that is out of position (displaced).  A fracture with multiple fragments (comminuted fracture).  A fracture that breaks the skin (open fracture). This type of fracture may require surgical wires, plates,  or screws to hold the bone in place.  You may have X-rays every couple of weeks to check on your healing. HOME CARE INSTRUCTIONS  Keep the injured arm above the level of your heart while you are sitting or lying down. This helps to reduce swelling and pain.  Apply ice to the injured area:  Put ice in a plastic bag.  Place a towel between your skin and the bag.  Leave the ice on for 20 minutes, 2-3 times per day.  Move your fingers often to avoid stiffness and to minimize swelling.  If you have a plaster or fiberglass cast:  Do not try to scratch the skin under the cast using sharp or  pointed objects.  Check the skin around the cast every day. You may put lotion on any red or sore areas.  Keep your cast dry and clean.  If you have a plaster splint:  Wear the splint as directed.  Loosen the elastic around the splint if your fingers become numb and tingle, or if they turn cold and blue.  Do not put pressure on any part of your cast until it is fully hardened. Rest your cast only on a pillow for the first 24 hours.  Protect your cast or splint while bathing or showering, as directed by your health care provider. Do not put your cast or splint into water.  Take medicines only as directed by your health care provider.  Return to activities, such as sports, as directed by your health care provider. Ask your health care provider what activities are safe for you.  Keep all follow-up visits as directed by your health care provider. This is important. SEEK MEDICAL CARE IF:  Your pain medicine is not helping.  Your cast gets damaged or it breaks.  Your cast becomes loose.  Your cast gets wet.  You have more severe pain or swelling than you did before the cast.  You have severe pain when stretching your fingers.  You continue to have pain or stiffness in your elbow or your wrist after your cast is taken off. SEEK IMMEDIATE MEDICAL CARE IF:  You cannot move your fingers.  You lose feeling in your fingers or your hand.  Your hand or your fingers turn cold and pale or blue.  You notice a bad smell coming from your cast.  You have drainage from underneath your cast.  You have new stains from blood or drainage seeping through your cast.   This information is not intended to replace advice given to you by your health care provider. Make sure you discuss any questions you have with your health care provider.   Document Released: 01/27/2006 Document Revised: 09/06/2014 Document Reviewed: 02/08/2014 Elsevier Interactive Patient Education [employer]

## 2016-01-18 NOTE — ED Notes (Signed)
Leone Brand Tech made aware of the need for splint.

## 2016-01-18 NOTE — ED Notes (Signed)
Ortho at bedside.

## 2016-01-18 NOTE — ED Notes (Signed)
Pt has no jewelry.

## 2016-01-18 NOTE — ED Notes (Signed)
[facility] contacted for transport home

## 2016-02-04 ENCOUNTER — Encounter (HOSPITAL_COMMUNITY): Payer: Self-pay | Admitting: *Deleted

## 2016-02-04 ENCOUNTER — Inpatient Hospital Stay (HOSPITAL_COMMUNITY)
Admission: RE | Admit: 2016-02-04 | Discharge: 2016-02-04 | Disposition: A | Discharge status: Home / Self Care | Payer: Medicare Other | Source: Ambulatory Visit | Attending: Nurse Practitioner | Admitting: Nurse Practitioner

## 2016-04-27 ENCOUNTER — Inpatient Hospital Stay (HOSPITAL_COMMUNITY): Admission: RE | Admit: 2016-04-27 | Payer: Medicare Other | Source: Ambulatory Visit | Admitting: Nurse Practitioner

## 2016-04-30 ENCOUNTER — Ambulatory Visit (HOSPITAL_COMMUNITY)
Admission: RE | Admit: 2016-04-30 | Discharge: 2016-04-30 | Disposition: A | Discharge status: Home / Self Care | Payer: Medicare Other | Source: Ambulatory Visit | Attending: Nurse Practitioner | Admitting: Nurse Practitioner

## 2016-04-30 ENCOUNTER — Encounter (HOSPITAL_COMMUNITY): Payer: Self-pay | Admitting: Nurse Practitioner

## 2016-04-30 ENCOUNTER — Other Ambulatory Visit (HOSPITAL_COMMUNITY): Payer: Self-pay | Admitting: *Deleted

## 2016-04-30 VITALS — BP 104/72 | HR 55 | Ht 69.0 in | Wt 175.4 lb

## 2016-04-30 DIAGNOSIS — I480 Paroxysmal atrial fibrillation: Secondary | ICD-10-CM

## 2016-04-30 DIAGNOSIS — Z794 Long term (current) use of insulin: Secondary | ICD-10-CM | POA: Insufficient documentation

## 2016-04-30 DIAGNOSIS — E559 Vitamin D deficiency, unspecified: Secondary | ICD-10-CM | POA: Insufficient documentation

## 2016-04-30 DIAGNOSIS — K219 Gastro-esophageal reflux disease without esophagitis: Secondary | ICD-10-CM | POA: Diagnosis not present

## 2016-04-30 DIAGNOSIS — Z7901 Long term (current) use of anticoagulants: Secondary | ICD-10-CM | POA: Insufficient documentation

## 2016-04-30 DIAGNOSIS — H409 Unspecified glaucoma: Secondary | ICD-10-CM | POA: Diagnosis not present

## 2016-04-30 DIAGNOSIS — E119 Type 2 diabetes mellitus without complications: Secondary | ICD-10-CM | POA: Diagnosis not present

## 2016-04-30 DIAGNOSIS — I4891 Unspecified atrial fibrillation: Secondary | ICD-10-CM | POA: Insufficient documentation

## 2016-04-30 DIAGNOSIS — Z79899 Other long term (current) drug therapy: Secondary | ICD-10-CM | POA: Insufficient documentation

## 2016-04-30 DIAGNOSIS — F79 Unspecified intellectual disabilities: Secondary | ICD-10-CM | POA: Diagnosis not present

## 2016-04-30 DIAGNOSIS — Z8249 Family history of ischemic heart disease and other diseases of the circulatory system: Secondary | ICD-10-CM | POA: Diagnosis not present

## 2016-04-30 DIAGNOSIS — F209 Schizophrenia, unspecified: Secondary | ICD-10-CM | POA: Diagnosis not present

## 2016-04-30 DIAGNOSIS — F17200 Nicotine dependence, unspecified, uncomplicated: Secondary | ICD-10-CM | POA: Diagnosis not present

## 2016-04-30 DIAGNOSIS — I10 Essential (primary) hypertension: Secondary | ICD-10-CM | POA: Diagnosis not present

## 2016-04-30 LAB — COMPREHENSIVE METABOLIC PANEL
ALT: 18 U/L (ref 17–63)
AST: 18 U/L (ref 15–41)
Albumin: 3.5 g/dL (ref 3.5–5.0)
Alkaline Phosphatase: 50 U/L (ref 38–126)
Anion gap: 6 (ref 5–15)
BILIRUBIN TOTAL: 0.6 mg/dL (ref 0.3–1.2)
BUN: 6 mg/dL (ref 6–20)
CHLORIDE: 106 mmol/L (ref 101–111)
CO2: 30 mmol/L (ref 22–32)
CREATININE: 1.08 mg/dL (ref 0.61–1.24)
Calcium: 9.2 mg/dL (ref 8.9–10.3)
Glucose, Bld: 113 mg/dL — ABNORMAL HIGH (ref 65–99)
POTASSIUM: 3.8 mmol/L (ref 3.5–5.1)
Sodium: 142 mmol/L (ref 135–145)
TOTAL PROTEIN: 6.3 g/dL — AB (ref 6.5–8.1)

## 2016-04-30 LAB — TSH: TSH: 0.486 u[IU]/mL (ref 0.350–4.500)

## 2016-04-30 NOTE — Progress Notes (Signed)
Patient ID: Jordan Rowe, male   DOB: [birth year], 70 y.o.   MRN: [id number]     Primary Care Physician: Pcp Not In System Referring Physician: [facility] f/u   Alassane Kalafut is a 70 y.o. male  with history of schizophrenia and mental retardation, diabetes mellitus and hypertension was brought from the nursing home to the ER after patient was complaining of chest pain. Patient stated he has been having chest pain off and on for 2 days. Patient was not exactly able to characterize the chest pain. Patient stated the chest pain was in the anterior chest wall. Sometime associated with shortness of breath. Denies any palpitations. In the ER patient was found to be in A. fib with RVR which was a new diagnosis for the patient. Chest x-ray showed congestion. Patient was  started on Cardizem infusion along with heparin and admitted for A. fib with RVR. Patient was chest pain-free on exam. Had one episode of nausea vomiting but abdomen  benign.  Atrial fibrillation with RVR (HCC): duration unknown. Weaned of cardizem gtt. Started on coreg, which was uptitrated for heart rate and blood pressure. Cardiology also started amiodarone oral load.  Heparin drip stopped and patient started on eliquis.CHADSVASC 3.   Systolic dysfunction: echo with EF 20-25%. New diagnosis, was on ACE inhibitor prior to admission. Lisinopril increased to 20 mg a day. Blood pressure was high and antihypertensives were adjusted. Also started on hydralazine No CHF on exam.Home lasix dose was continued. Started on spironolactone. Myoview showed no ischemia. Cleared by cardiology for discharge with outpatient follow-up.  F/u in  afib clinic 3/2, is without attendant. Pt can give simple answers to questions when asked but no complex answers and definitely is not a good historian.   F/u in afib clinic 4/4. He is unattended. He denies any chest pain or shortness of breath. Answers "no"  to any irregular heart beat. Has a heart rate around 50 but  tolerates, same as last visit and again answers "no" to any tendency to being lightheaded, presyncopal, dizzy. Overall no complaints. Denies "no" to any bleeding issues.  F/u 9/1, He is with attendant. Had fall and was seen in ER in May, no fx. He denies any irregular heart beat, dizziness,  No obvious fluid. No issues with breathing or chest pain.   Today, he denies symptoms of palpitations, chest pain, shortness of breath, orthopnea, PND, lower extremity edema, dizziness, presyncope, syncope, or neurologic sequela. The patient is tolerating medications without difficulties and is otherwise without complaint today.   Past Medical History:  Diagnosis Date  . Atrial fibrillation with RVR (HCC)    Jordan Rowe 09/30/2015  . BPH (benign prostatic hypertrophy)    Jordan Rowe 09/30/2015  . Constipation   . Edema   . GERD (gastroesophageal reflux disease)   . Glaucoma   . Hypertension   . Mental retardation    Jordan Rowe 09/30/2015  . Schizophrenia (HCC)   . Seizure (HCC)   . Type II diabetes mellitus (HCC)    Jordan Rowe 09/30/2015  . Vitamin D deficiency    Past Surgical History:  Procedure Laterality Date  . CATARACT EXTRACTION Left 12/2002   Jordan Rowe 01/12/2011  . CATARACT EXTRACTION W/ INTRAOCULAR LENS IMPLANT Right 01/2011   Jordan Rowe 02/18/2011  . MULTIPLE TOOTH EXTRACTIONS      Current Outpatient Prescriptions  Medication Sig Dispense Refill  . acetaminophen (TYLENOL) 325 MG tablet Take 325-650 mg by mouth every 6 (six) hours as needed for mild pain or moderate pain.     Marland Kitchen  amiodarone (PACERONE) 200 MG tablet Take 1 tablet (200 mg total) by mouth daily.    Marland Kitchen. apixaban (ELIQUIS) 5 MG TABS tablet Take 1 tablet (5 mg total) by mouth 2 (two) times daily. 60 tablet 0  . brinzolamide (AZOPT) 1 % ophthalmic suspension Place 1 drop into both eyes 2 (two) times daily.    . carvedilol (COREG) 25 MG tablet Take 1 tablet (25 mg total) by mouth 2 (two) times daily with a meal. 60 tablet 1  . cholecalciferol (VITAMIN D)  1000 UNITS tablet Take 1,000 Units by mouth every morning.     . cycloSPORINE (RESTASIS) 0.05 % ophthalmic emulsion Place 1 drop into both eyes 2 (two) times daily.     Marland Kitchen. docusate sodium (COLACE) 100 MG capsule Take 100 mg by mouth 2 (two) times daily.    . ferrous sulfate 325 (65 FE) MG tablet Take 325 mg by mouth daily with breakfast.    . finasteride (PROSCAR) 5 MG tablet Take 5 mg by mouth every evening.     . furosemide (LASIX) 20 MG tablet Take 20 mg by mouth daily.    . hydrALAZINE (APRESOLINE) 25 MG tablet Take 1 tablet (25 mg total) by mouth 3 (three) times daily. 90 tablet 1  . HYDROcodone-acetaminophen (NORCO/VICODIN) 5-325 MG tablet Take 1 tablet by mouth every 6 (six) hours as needed. 20 tablet 0  . insulin glargine (LANTUS) 100 UNIT/ML injection Inject 0.05 mLs (5 Units total) into the skin at bedtime. Hold is FSBS is less than 75 10 mL 11  . isosorbide mononitrate (ISMO,MONOKET) 20 MG tablet Take 20 mg by mouth 2 (two) times daily.    Marland Kitchen. latanoprost (XALATAN) 0.005 % ophthalmic solution Place 1 drop into both eyes at bedtime.    . Linaclotide (LINZESS) 145 MCG CAPS capsule Take 145 mcg by mouth every morning.     Marland Kitchen. lisinopril (PRINIVIL,ZESTRIL) 20 MG tablet Take 1 tablet (20 mg total) by mouth daily. 30 tablet 1  . meloxicam (MOBIC) 15 MG tablet Take 15 mg by mouth daily as needed for pain.    Marland Kitchen. omeprazole (PRILOSEC) 20 MG capsule Take 40 mg by mouth every evening. 30 min prior to evening meal.    . Polyvinyl Alcohol-Povidone (REFRESH OP) Place 1 drop into both eyes 2 (two) times daily.    . risperiDONE (RISPERDAL) 2 MG tablet Take 2 mg by mouth at bedtime.    . senna (SENOKOT) 8.6 MG TABS Take 2 tablets by mouth at bedtime.    Marland Kitchen. spironolactone (ALDACTONE) 25 MG tablet Take 1 tablet (25 mg total) by mouth daily. 30 tablet 1  . traMADol (ULTRAM) 50 MG tablet Take 50 mg by mouth every 8 (eight) hours as needed for pain. Reported on 09/30/2015     No current facility-administered  medications for this encounter.     No Known Allergies  Social History   Social History  . Marital status: Divorced    Spouse name: N/A  . Number of children: N/A  . Years of education: N/A   Occupational History  . Not on file.   Social History Main Topics  . Smoking status: Current Every Day Smoker  . Smokeless tobacco: Not on file  . Alcohol use No  . Drug use: No  . Sexual activity: Not on file   Other Topics Concern  . Not on file   Social History Narrative  . No narrative on file    Family History  Problem Relation Age of  Onset  . Hypertension Mother     ROS- All systems are reviewed and negative except as per the HPI above  Physical Exam: Vitals:   04/30/16 1015  BP: 104/72  Pulse: (!) 55  Weight: 175 lb 6.4 oz (79.6 kg)  Height: 5\' 9"  (1.753 m)    GEN- The patient is well appearing, alert and oriented x 3 today.   Head- normocephalic, atraumatic Eyes-  Sclera clear, conjunctiva pink Ears- hearing intact Oropharynx- clear Neck- supple, no JVP Lymph- no cervical lymphadenopathy Lungs- Clear to ausculation bilaterally, normal work of breathing Heart- Regular rate and rhythm, no murmurs, rubs or gallops, PMI not laterally displaced GI- soft, NT, ND, + BS Extremities- no clubbing, cyanosis, or edema MS- no significant deformity or atrophy Skin- no rash or lesion Psych- euthymic mood, full affect Neuro- strength and sensation are intact  EKG-unusual P axis. Possible ectopic atrial bradycardia, HR 55 bpm, qrs int 80, qtc 470 ms Epic records reviewed  Assessment and Plan: 1.  afib Appears in be in a regular rhythm Amiodarone at 200 mg a day Pt is not symptomatic with HR of 55 TSH, CMET today  2. LV dysfunction Continue spironolactone, Lisinopril, carvedilol, lasix Fluid level stable   3. HTN Stable  4. Chadsvasc score of 3 Continue eliquis 5 mg bid   F/u in 4 months Repeat echo on f/u for improvement in EF with SR Possible  PFT's  Lupita Leash C[facility] [address] [place], [place] [address] [phone]

## 2016-09-03 ENCOUNTER — Ambulatory Visit (HOSPITAL_COMMUNITY): Payer: Medicare Other

## 2016-09-03 ENCOUNTER — Ambulatory Visit (HOSPITAL_COMMUNITY): Payer: Medicare Other | Admitting: Nurse Practitioner

## 2016-10-11 ENCOUNTER — Inpatient Hospital Stay (HOSPITAL_COMMUNITY): Admission: RE | Admit: 2016-10-11 | Payer: Medicare Other | Source: Ambulatory Visit | Admitting: Nurse Practitioner

## 2016-10-11 ENCOUNTER — Ambulatory Visit (HOSPITAL_COMMUNITY): Admission: RE | Admit: 2016-10-11 | Payer: Medicare Other | Source: Ambulatory Visit

## 2016-10-21 ENCOUNTER — Encounter (HOSPITAL_COMMUNITY): Payer: Self-pay | Admitting: Nurse Practitioner

## 2016-10-21 ENCOUNTER — Ambulatory Visit (HOSPITAL_BASED_OUTPATIENT_CLINIC_OR_DEPARTMENT_OTHER)
Admission: RE | Admit: 2016-10-21 | Discharge: 2016-10-21 | Disposition: A | Discharge status: Home / Self Care | Payer: Medicare Other | Source: Ambulatory Visit | Attending: Nurse Practitioner | Admitting: Nurse Practitioner

## 2016-10-21 ENCOUNTER — Ambulatory Visit (HOSPITAL_COMMUNITY)
Admission: RE | Admit: 2016-10-21 | Discharge: 2016-10-21 | Disposition: A | Discharge status: Home / Self Care | Payer: Medicare Other | Source: Ambulatory Visit | Attending: Nurse Practitioner | Admitting: Nurse Practitioner

## 2016-10-21 VITALS — BP 134/82 | HR 50 | Ht 69.0 in | Wt 165.6 lb

## 2016-10-21 DIAGNOSIS — N400 Enlarged prostate without lower urinary tract symptoms: Secondary | ICD-10-CM | POA: Diagnosis not present

## 2016-10-21 DIAGNOSIS — E559 Vitamin D deficiency, unspecified: Secondary | ICD-10-CM | POA: Insufficient documentation

## 2016-10-21 DIAGNOSIS — Z794 Long term (current) use of insulin: Secondary | ICD-10-CM | POA: Diagnosis not present

## 2016-10-21 DIAGNOSIS — I4891 Unspecified atrial fibrillation: Secondary | ICD-10-CM | POA: Insufficient documentation

## 2016-10-21 DIAGNOSIS — F17200 Nicotine dependence, unspecified, uncomplicated: Secondary | ICD-10-CM | POA: Diagnosis not present

## 2016-10-21 DIAGNOSIS — Z79899 Other long term (current) drug therapy: Secondary | ICD-10-CM | POA: Diagnosis not present

## 2016-10-21 DIAGNOSIS — K219 Gastro-esophageal reflux disease without esophagitis: Secondary | ICD-10-CM | POA: Insufficient documentation

## 2016-10-21 DIAGNOSIS — F209 Schizophrenia, unspecified: Secondary | ICD-10-CM | POA: Insufficient documentation

## 2016-10-21 DIAGNOSIS — R079 Chest pain, unspecified: Secondary | ICD-10-CM | POA: Diagnosis not present

## 2016-10-21 DIAGNOSIS — I519 Heart disease, unspecified: Secondary | ICD-10-CM | POA: Insufficient documentation

## 2016-10-21 DIAGNOSIS — I480 Paroxysmal atrial fibrillation: Secondary | ICD-10-CM

## 2016-10-21 DIAGNOSIS — I10 Essential (primary) hypertension: Secondary | ICD-10-CM | POA: Insufficient documentation

## 2016-10-21 DIAGNOSIS — Z7901 Long term (current) use of anticoagulants: Secondary | ICD-10-CM | POA: Insufficient documentation

## 2016-10-21 DIAGNOSIS — F79 Unspecified intellectual disabilities: Secondary | ICD-10-CM | POA: Diagnosis not present

## 2016-10-21 DIAGNOSIS — E119 Type 2 diabetes mellitus without complications: Secondary | ICD-10-CM | POA: Diagnosis not present

## 2016-10-21 DIAGNOSIS — Z9889 Other specified postprocedural states: Secondary | ICD-10-CM | POA: Diagnosis not present

## 2016-10-21 DIAGNOSIS — H409 Unspecified glaucoma: Secondary | ICD-10-CM | POA: Diagnosis not present

## 2016-10-21 LAB — ECHOCARDIOGRAM COMPLETE
CHL CUP DOP CALC LVOT VTI: 13.2 cm
CHL CUP MV DEC (S): 232
E decel time: 232 msec
E/e' ratio: 7.3
FS: 24 % — AB (ref 28–44)
IV/PV OW: 0.97
LA ID, A-P, ES: 32 mm
LA diam end sys: 32 mm
LA diam index: 1.64 cm/m2
LA vol index: 33.1 mL/m2
LA vol: 64.5 mL
LAVOLA4C: 58.1 mL
LDCA: 3.8 cm2
LV E/e' medial: 7.3
LV E/e'average: 7.3
LV TDI E'LATERAL: 8.7
LV TDI E'MEDIAL: 5.22
LV sys vol: 54 mL (ref 21–61)
LVDIAVOL: 89 mL (ref 62–150)
LVDIAVOLIN: 46 mL/m2
LVELAT: 8.7 cm/s
LVOT peak grad rest: 2 mmHg
LVOT peak vel: 70.2 cm/s
LVOTD: 22 mm
LVOTSV: 50 mL
LVSYSVOLIN: 28 mL/m2
MV pk E vel: 63.5 m/s
MVPKAVEL: 49.5 m/s
PW: 9.85 mm — AB (ref 0.6–1.1)
RV sys press: 20 mmHg
Reg peak vel: 204 cm/s
Simpson's disk: 39
Stroke v: 34 ml
TRMAXVEL: 204 cm/s

## 2016-10-21 LAB — COMPREHENSIVE METABOLIC PANEL
ALK PHOS: 36 U/L — AB (ref 38–126)
ALT: 13 U/L — AB (ref 17–63)
ANION GAP: 7 (ref 5–15)
AST: 17 U/L (ref 15–41)
Albumin: 3.3 g/dL — ABNORMAL LOW (ref 3.5–5.0)
BILIRUBIN TOTAL: 0.7 mg/dL (ref 0.3–1.2)
BUN: 6 mg/dL (ref 6–20)
CALCIUM: 8.9 mg/dL (ref 8.9–10.3)
CO2: 30 mmol/L (ref 22–32)
CREATININE: 1.08 mg/dL (ref 0.61–1.24)
Chloride: 102 mmol/L (ref 101–111)
Glucose, Bld: 98 mg/dL (ref 65–99)
Potassium: 3.7 mmol/L (ref 3.5–5.1)
SODIUM: 139 mmol/L (ref 135–145)
TOTAL PROTEIN: 5.7 g/dL — AB (ref 6.5–8.1)

## 2016-10-21 LAB — TSH: TSH: 0.474 u[IU]/mL (ref 0.350–4.500)

## 2016-10-21 NOTE — Progress Notes (Signed)
  Echocardiogram 2D Echocardiogram has been performed.  Jordan Rowe 10/21/2016, 11:56 AM

## 2016-10-21 NOTE — Progress Notes (Signed)
Patient ID: Travoris Bushey, male   DOB: September 24, 1945, 71 y.o.   MRN: [id number]     Primary Care Physician: Pcp Not In System Referring Physician: [facility] f/u   Khyle Goodell is a 71 y.o. male  with history of schizophrenia and mental retardation, diabetes mellitus and hypertension was brought from the nursing home to the ER after patient was complaining of chest pain. Patient stated he has been having chest pain off and on for 2 days. Patient was not exactly able to characterize the chest pain. Patient stated the chest pain was in the anterior chest wall. Sometime associated with shortness of breath. Denies any palpitations. In the ER patient was found to be in A. fib with RVR which was a new diagnosis for the patient. Chest x-ray showed congestion. Patient was  started on Cardizem infusion along with heparin and admitted for A. fib with RVR. Patient was chest pain-free on exam. Had one episode of nausea vomiting but abdomen  benign.  Atrial fibrillation with RVR (HCC): duration unknown. Weaned of cardizem gtt. Started on coreg, which was uptitrated for heart rate and blood pressure. Cardiology also started amiodarone oral load.  Heparin drip stopped and patient started on eliquis.CHADSVASC 3.   Systolic dysfunction: echo with EF 20-25%. New diagnosis, was on ACE inhibitor prior to admission. Lisinopril increased to 20 mg a day. Blood pressure was high and antihypertensives were adjusted. Also started on hydralazine No CHF on exam.Home lasix dose was continued. Started on spironolactone. Myoview showed no ischemia. Cleared by cardiology for discharge with outpatient follow-up.  F/u in  afib clinic 3/2, is without attendant. Pt can give simple answers to questions when asked but no complex answers and definitely is not a good historian.   F/u in afib clinic 4/4. He is unattended. He denies any chest pain or shortness of breath. Answers "no"  to any irregular heart beat. Has a heart rate around 50 but  tolerates, same as last visit and again answers "no" to any tendency to being lightheaded, presyncopal, dizzy. Overall no complaints. Denies "no" to any bleeding issues.  F/u 9/1, He is with attendant. Had fall and was seen in ER in May, no fx. He denies any irregular heart beat, dizziness,  No obvious fluid. No issues with breathing or chest pain.   F/U 2/23.  He is with an attendant from his nursing facility. He answers simple questions. Denies any chest pain, palpitations, shortness of breath or bleeding issues. His attendant states that he has not had any falss or bleeding events. He conintues in sinus brady but dizziness. States he feels ok.   Past Medical History:  Diagnosis Date  . Atrial fibrillation with RVR (HCC)    [name] 09/30/2015  . BPH (benign prostatic hypertrophy)    [name] 09/30/2015  . Constipation   . Edema   . GERD (gastroesophageal reflux disease)   . Glaucoma   . Hypertension   . Mental retardation    [name] 09/30/2015  . Schizophrenia (HCC)   . Seizure (HCC)   . Type II diabetes mellitus (HCC)    [name] 09/30/2015  . Vitamin D deficiency    Past Surgical History:  Procedure Laterality Date  . CATARACT EXTRACTION Left 12/2002   [name] 01/12/2011  . CATARACT EXTRACTION W/ INTRAOCULAR LENS IMPLANT Right 01/2011   [name] 02/18/2011  . MULTIPLE TOOTH EXTRACTIONS      Current Outpatient Prescriptions  Medication Sig Dispense Refill  . acetaminophen (TYLENOL) 325 MG tablet Take 325-650 mg  by mouth every 6 (six) hours as needed for mild pain or moderate pain.     Marland Kitchen amiodarone (PACERONE) 200 MG tablet Take 1 tablet (200 mg total) by mouth daily.    Marland Kitchen apixaban (ELIQUIS) 5 MG TABS tablet Take 1 tablet (5 mg total) by mouth 2 (two) times daily. 60 tablet 0  . brinzolamide (AZOPT) 1 % ophthalmic suspension Place 1 drop into both eyes 2 (two) times daily.    . carvedilol (COREG) 25 MG tablet Take 1 tablet (25 mg total) by mouth 2 (two) times daily with a meal. 60 tablet 1    . cholecalciferol (VITAMIN D) 1000 UNITS tablet Take 1,000 Units by mouth every morning.     . cycloSPORINE (RESTASIS) 0.05 % ophthalmic emulsion Place 1 drop into both eyes 2 (two) times daily.     Marland Kitchen docusate sodium (COLACE) 100 MG capsule Take 100 mg by mouth 2 (two) times daily.    . ferrous sulfate 325 (65 FE) MG tablet Take 325 mg by mouth daily with breakfast.    . finasteride (PROSCAR) 5 MG tablet Take 5 mg by mouth every evening.     . furosemide (LASIX) 20 MG tablet Take 20 mg by mouth daily.    . hydrALAZINE (APRESOLINE) 25 MG tablet Take 1 tablet (25 mg total) by mouth 3 (three) times daily. 90 tablet 1  . insulin glargine (LANTUS) 100 UNIT/ML injection Inject 0.05 mLs (5 Units total) into the skin at bedtime. Hold is FSBS is less than 75 (Patient taking differently: Inject 8 Units into the skin at bedtime. Hold is FSBS is less than 75) 10 mL 11  . isosorbide mononitrate (ISMO,MONOKET) 20 MG tablet Take 20 mg by mouth 2 (two) times daily.    Marland Kitchen latanoprost (XALATAN) 0.005 % ophthalmic solution Place 1 drop into both eyes at bedtime.    Marland Kitchen lisinopril (PRINIVIL,ZESTRIL) 20 MG tablet Take 1 tablet (20 mg total) by mouth daily. 30 tablet 1  . meloxicam (MOBIC) 15 MG tablet Take 15 mg by mouth daily as needed for pain.    Marland Kitchen omeprazole (PRILOSEC) 20 MG capsule Take 40 mg by mouth every evening. 30 min prior to evening meal.    . Polyvinyl Alcohol-Povidone (REFRESH OP) Place 1 drop into both eyes 2 (two) times daily.    . risperiDONE (RISPERDAL) 2 MG tablet Take 2 mg by mouth at bedtime.    . senna (SENOKOT) 8.6 MG TABS Take 2 tablets by mouth at bedtime.    Marland Kitchen spironolactone (ALDACTONE) 25 MG tablet Take 1 tablet (25 mg total) by mouth daily. 30 tablet 1  . traMADol (ULTRAM) 50 MG tablet Take 50 mg by mouth every 8 (eight) hours as needed for pain. Reported on 09/30/2015    . HYDROcodone-acetaminophen (NORCO/VICODIN) 5-325 MG tablet Take 1 tablet by mouth every 6 (six) hours as needed. (Patient  not taking: Reported on 10/21/2016) 20 tablet 0   No current facility-administered medications for this encounter.     No Known Allergies  Social History   Social History  . Marital status: Divorced    Spouse name: N/A  . Number of children: N/A  . Years of education: N/A   Occupational History  . Not on file.   Social History Main Topics  . Smoking status: Current Every Day Smoker  . Smokeless tobacco: Never Used  . Alcohol use No  . Drug use: No  . Sexual activity: Not on file   Other Topics Concern  .  Not on file   Social History Narrative  . No narrative on file    Family History  Problem Relation Age of Onset  . Hypertension Mother     ROS- All systems are reviewed and negative except as per the HPI above  Physical Exam: Vitals:   10/21/16 1150  BP: 134/82  Pulse: (!) 50  Weight: 165 lb 9.6 oz (75.1 kg)  Height: 5\' 9"  (1.753 m)    GEN- The patient is well appearing, alert and oriented x 3 today.   Head- normocephalic, atraumatic Eyes-  Sclera clear, conjunctiva pink Ears- hearing intact Oropharynx- clear Neck- supple, no JVP Lymph- no cervical lymphadenopathy Lungs- Clear to ausculation bilaterally, normal work of breathing Heart- Regular rate and rhythm, no murmurs, rubs or gallops, PMI not laterally displaced GI- soft, NT, ND, + BS Extremities- no clubbing, cyanosis, or edema MS- no significant deformity or atrophy Skin- no rash or lesion Psych- euthymic mood, full affect Neuro- strength and sensation are intact  EKG- sinus brady at 50 bpm, pr int 132 ms, qrs 86 ms, qtc 439 ms Epic records reviewed  Assessment and Plan: 1.  afib  Maintaining SR with amioderone Continue amiodarone at 200 mg a day Pt is not symptomatic with HR of 50 TSH, CMET today  2. LV dysfunction Continue spironolactone, Lisinopril, carvedilol, lasix Fluid level stable  Echo repeated today  3. HTN Stable  4. Chadsvasc score of 3 Continue eliquis 5 mg  bid  F/u in 6 months   Lupita Leash [facility] [address] [place], [place] [address] [phone]

## 2016-10-22 ENCOUNTER — Encounter (HOSPITAL_COMMUNITY): Payer: Self-pay | Admitting: Emergency Medicine

## 2016-10-22 ENCOUNTER — Emergency Department (HOSPITAL_COMMUNITY)
Admission: EM | Admit: 2016-10-22 | Discharge: 2016-10-22 | Disposition: A | Discharge status: Home / Self Care | Payer: Medicare Other | Attending: Emergency Medicine | Admitting: Emergency Medicine

## 2016-10-22 DIAGNOSIS — F17200 Nicotine dependence, unspecified, uncomplicated: Secondary | ICD-10-CM | POA: Diagnosis not present

## 2016-10-22 DIAGNOSIS — Z79899 Other long term (current) drug therapy: Secondary | ICD-10-CM | POA: Diagnosis not present

## 2016-10-22 DIAGNOSIS — Z7901 Long term (current) use of anticoagulants: Secondary | ICD-10-CM | POA: Diagnosis not present

## 2016-10-22 DIAGNOSIS — Z794 Long term (current) use of insulin: Secondary | ICD-10-CM | POA: Insufficient documentation

## 2016-10-22 DIAGNOSIS — I10 Essential (primary) hypertension: Secondary | ICD-10-CM | POA: Insufficient documentation

## 2016-10-22 DIAGNOSIS — E119 Type 2 diabetes mellitus without complications: Secondary | ICD-10-CM | POA: Insufficient documentation

## 2016-10-22 DIAGNOSIS — R339 Retention of urine, unspecified: Secondary | ICD-10-CM | POA: Diagnosis present

## 2016-10-22 LAB — CBC WITH DIFFERENTIAL/PLATELET
BASOS ABS: 0 10*3/uL (ref 0.0–0.1)
BASOS PCT: 0 %
EOS PCT: 0 %
Eosinophils Absolute: 0 10*3/uL (ref 0.0–0.7)
HCT: 41 % (ref 39.0–52.0)
Hemoglobin: 14.2 g/dL (ref 13.0–17.0)
LYMPHS PCT: 19 %
Lymphs Abs: 1.3 10*3/uL (ref 0.7–4.0)
MCH: 31.2 pg (ref 26.0–34.0)
MCHC: 34.6 g/dL (ref 30.0–36.0)
MCV: 90.1 fL (ref 78.0–100.0)
MONO ABS: 0.4 10*3/uL (ref 0.1–1.0)
Monocytes Relative: 6 %
NEUTROS ABS: 5.2 10*3/uL (ref 1.7–7.7)
Neutrophils Relative %: 75 %
PLATELETS: 188 10*3/uL (ref 150–400)
RBC: 4.55 MIL/uL (ref 4.22–5.81)
RDW: 13.1 % (ref 11.5–15.5)
WBC: 7 10*3/uL (ref 4.0–10.5)

## 2016-10-22 LAB — URINALYSIS, ROUTINE W REFLEX MICROSCOPIC
Bilirubin Urine: NEGATIVE
Glucose, UA: NEGATIVE mg/dL
Hgb urine dipstick: NEGATIVE
KETONES UR: NEGATIVE mg/dL
LEUKOCYTES UA: NEGATIVE
Nitrite: NEGATIVE
PROTEIN: NEGATIVE mg/dL
Specific Gravity, Urine: 1.004 — ABNORMAL LOW (ref 1.005–1.030)
pH: 7 (ref 5.0–8.0)

## 2016-10-22 LAB — BASIC METABOLIC PANEL
ANION GAP: 8 (ref 5–15)
BUN: 9 mg/dL (ref 6–20)
CALCIUM: 8.6 mg/dL — AB (ref 8.9–10.3)
CO2: 27 mmol/L (ref 22–32)
Chloride: 103 mmol/L (ref 101–111)
Creatinine, Ser: 1.02 mg/dL (ref 0.61–1.24)
GFR calc Af Amer: >60 mL/min (ref >60)
Glucose, Bld: 131 mg/dL — ABNORMAL HIGH (ref 65–99)
POTASSIUM: 3.6 mmol/L (ref 3.5–5.1)
SODIUM: 138 mmol/L (ref 135–145)

## 2016-10-22 MED ORDER — TAMSULOSIN HCL 0.4 MG PO CAPS: 0.4000 mg | ORAL_CAPSULE | Freq: Every day | ORAL | 0 refills | Status: AC

## 2016-10-22 MED ORDER — LIDOCAINE HCL 2 % EX GEL
1.0000 "application " | Freq: Once | CUTANEOUS | Status: AC | PRN
  Administered 2016-10-22: 1 via URETHRAL
  Filled 2016-10-22: qty 11

## 2016-10-22 NOTE — ED Notes (Signed)
Bed: WA11 Expected date:  Expected time:  Means of arrival:  Comments: EMS 

## 2016-10-22 NOTE — ED Triage Notes (Signed)
Patient states he is constipated and incontinent of urine. Patient claims he can not pee. Patient called EMS and facility did know he had called. Patient facility is [facility].

## 2016-10-22 NOTE — ED Notes (Signed)
Called [facility] to give discharge instructions on patient returning back to their facility.  Was told by Marylene Land that "there is no one here yet to get that, so can you fax the information?"  Obtained Fax number and sent information per Angela's request.  Informed her that he would be coming back with a prescription as well.

## 2016-10-22 NOTE — Discharge Instructions (Signed)
Read the information below.  Use the prescribed medication as directed.  Please discuss all new medications with your pharmacist.  You may return to the Emergency Department at any time for worsening condition or any new symptoms that concern you.   If you develop high fevers, worsening abdominal pain, uncontrolled vomiting, or are unable to tolerate fluids by mouth, return to the ER for a recheck.  ° °

## 2016-10-22 NOTE — ED Provider Notes (Signed)
[facility]-EMERGENCY DEPT Provider Note   CSN: [id number] Arrival date & time: 10/22/16  7829     History   Chief Complaint Chief Complaint  Patient presents with  . Urinary Retention    HPI Jordan Rowe is a 71 y.o. male.  HPI   Pt with hx mental retardation, afib, schizophrenia, DM, HTN p/w inability to urinate or defecate.  Does note small hard stool earlier today.  Denies abdominal pain, fevers, back pain, vomiting.     Level V caveat for mental retardation.   Past Medical History:  Diagnosis Date  . Atrial fibrillation with RVR (HCC)    [name] 09/30/2015  . BPH (benign prostatic hypertrophy)    [name] 09/30/2015  . Constipation   . Edema   . GERD (gastroesophageal reflux disease)   . Glaucoma   . Hypertension   . Mental retardation    [name] 09/30/2015  . Schizophrenia (HCC)   . Seizure (HCC)   . Type II diabetes mellitus (HCC)    [name] 09/30/2015  . Vitamin D deficiency     Patient Active Problem List   Diagnosis Date Noted  . NSVT (nonsustained ventricular tachycardia) (HCC) 10/05/2015  . Cardiomyopathy (HCC)   . Smoker 10/01/2015  . Schizophrenia (HCC)   . New onset a-fib (HCC) 09/30/2015  . Atrial fibrillation with RVR (HCC) 09/30/2015  . Chest pain 09/30/2015  . Diabetes mellitus type 2, controlled (HCC) 09/30/2015  . Atrial fibrillation (HCC) 09/30/2015  . Pain in the chest   . 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 Diabetes mellitus (HCC) 02/02/2013  . Dehydration 02/02/2013  . Acute renal failure (HCC) 02/02/2013  . Hypoglycemia 02/02/2013  . MENTAL RETARDATION 02/12/2009  . Essential hypertension 02/12/2009  . GERD 02/12/2009  . CONSTIPATION 02/12/2009  . CHEST PAIN 02/12/2009  . NAUSEA AND VOMITING 02/12/2009    Past Surgical History:  Procedure Laterality Date  . CATARACT EXTRACTION Left 12/2002   [name] 01/12/2011  . CATARACT EXTRACTION W/ INTRAOCULAR LENS IMPLANT Right 01/2011   [name] 02/18/2011  . MULTIPLE TOOTH EXTRACTIONS         Home  Medications    Prior to Admission medications   Medication Sig Start Date End Date Taking? Authorizing Provider  acetaminophen (TYLENOL) 325 MG tablet Take 325-650 mg by mouth every 6 (six) hours as needed for mild pain or moderate pain.    Yes Historical Provider, MD  amiodarone (PACERONE) 200 MG tablet Take 1 tablet (200 mg total) by mouth daily. 10/30/15  Yes Newman Nip, NP  apixaban (ELIQUIS) 5 MG TABS tablet Take 1 tablet (5 mg total) by mouth 2 (two) times daily. 10/07/15  Yes Christiane Ha, MD  carvedilol (COREG) 25 MG tablet Take 1 tablet (25 mg total) by mouth 2 (two) times daily with a meal. 10/07/15  Yes Christiane Ha, MD  cholecalciferol (VITAMIN D) 1000 UNITS tablet Take 1,000 Units by mouth every morning.    Yes Historical Provider, MD  docusate sodium (COLACE) 100 MG capsule Take 100 mg by mouth 2 (two) times daily.   Yes Historical Provider, MD  ferrous sulfate 325 (65 FE) MG tablet Take 325 mg by mouth daily with breakfast.   Yes Historical Provider, MD  finasteride (PROSCAR) 5 MG tablet Take 5 mg by mouth every evening.    Yes Historical Provider, MD  furosemide (LASIX) 20 MG tablet Take 20 mg by mouth daily.   Yes Historical Provider, MD  hydrALAZINE (APRESOLINE) 25 MG tablet Take 1 tablet (25 mg  total) by mouth 3 (three) times daily. 10/07/15  Yes Christiane Ha, MD  insulin glargine (LANTUS) 100 UNIT/ML injection Inject 0.05 mLs (5 Units total) into the skin at bedtime. Hold is FSBS is less than 75 Patient taking differently: Inject 8 Units into the skin at bedtime. Hold is FSBS is less than 75 10/07/15  Yes Christiane Ha, MD  isosorbide mononitrate (ISMO,MONOKET) 20 MG tablet Take 20 mg by mouth 2 (two) times daily.   Yes Historical Provider, MD  latanoprost (XALATAN) 0.005 % ophthalmic solution Place 1 drop into both eyes at bedtime.   Yes Historical Provider, MD  lisinopril (PRINIVIL,ZESTRIL) 20 MG tablet Take 1 tablet (20 mg total) by mouth daily. 10/07/15  Yes  Christiane Ha, MD  meloxicam (MOBIC) 15 MG tablet Take 15 mg by mouth daily as needed for pain.   Yes Historical Provider, MD  omeprazole (PRILOSEC) 20 MG capsule Take 40 mg by mouth every evening. 30 min prior to evening meal.   Yes Historical Provider, MD  Polyvinyl Alcohol-Povidone (REFRESH OP) Place 1 drop into both eyes 2 (two) times daily.   Yes Historical Provider, MD  risperiDONE (RISPERDAL) 2 MG tablet Take 2 mg by mouth at bedtime.   Yes Historical Provider, MD  senna (SENOKOT) 8.6 MG TABS Take 2 tablets by mouth at bedtime.   Yes Historical Provider, MD  spironolactone (ALDACTONE) 25 MG tablet Take 1 tablet (25 mg total) by mouth daily. 10/07/15  Yes Christiane Ha, MD  timolol (BETIMOL) 0.5 % ophthalmic solution Place 1 drop into both eyes daily.   Yes Historical Provider, MD  traMADol (ULTRAM) 50 MG tablet Take 50 mg by mouth 3 (three) times daily. Reported on 09/30/2015   Yes Historical Provider, MD  HYDROcodone-acetaminophen (NORCO/VICODIN) 5-325 MG tablet Take 1 tablet by mouth every 6 (six) hours as needed. Patient not taking: Reported on 10/21/2016 01/18/16   Cheri Fowler, PA-C  tamsulosin (FLOMAX) 0.4 MG CAPS capsule Take 1 capsule (0.4 mg total) by mouth daily. 10/22/16   Trixie Dredge, PA-C    Family History Family History  Problem Relation Age of Onset  . Hypertension Mother     Social History Social History  Substance Use Topics  . Smoking status: Current Every Day Smoker  . Smokeless tobacco: Never Used  . Alcohol use No     Allergies   Patient has no known allergies.   Review of Systems Review of Systems  Unable to perform ROS: Other     Physical Exam Updated Vital Signs BP 120/68 (BP Location: Right Arm)   Pulse (!) 49   Temp 98.3 F (36.8 C) (Oral)   Resp 18   Ht 5\' 9"  (1.753 m)   Wt 74.8 kg   SpO2 97%   BMI 24.37 kg/m   Physical Exam  Constitutional: He appears well-developed and well-nourished. No distress.  HENT:  Head: Normocephalic  and atraumatic.  Neck: Neck supple.  Cardiovascular: Normal rate and regular rhythm.   Pulmonary/Chest: Effort normal and breath sounds normal. No respiratory distress. He has no wheezes. He has no rales.  Abdominal: Soft. He exhibits distension. There is no tenderness. There is no rebound and no guarding.  Genitourinary: Rectum normal. Rectal exam shows no mass, no tenderness and anal tone normal.  Genitourinary Comments: No stool impaction.  Dark stool on glove.    Musculoskeletal:  NO tenderness of lower back.  Lower extremities:  Strength 5/5, sensation intact, distal pulses intact.  Neurological: He is alert. He exhibits normal muscle tone.  Skin: He is not diaphoretic.  Nursing note and vitals reviewed.    ED Treatments / Results  Labs (all labs ordered are listed, but only abnormal results are displayed) Labs Reviewed  URINALYSIS, ROUTINE W REFLEX MICROSCOPIC - Abnormal; Notable for the following:       Result Value   Color, Urine STRAW (*)    Specific Gravity, Urine 1.004 (*)    All other components within normal limits  BASIC METABOLIC PANEL - Abnormal; Notable for the following:    Glucose, Bld 131 (*)    Calcium 8.6 (*)    All other components within normal limits  URINE CULTURE  CBC WITH DIFFERENTIAL/PLATELET    EKG  EKG Interpretation None       Radiology No results found.  Procedures Procedures (including critical care time)  Medications Ordered in ED Medications  lidocaine (XYLOCAINE) 2 % jelly 1 application (1 application Urethral Given 10/22/16 0636)     Initial Impression / Assessment and Plan / ED Course  I have reviewed the triage vital signs and the nursing notes.  Pertinent labs & imaging results that were available during my care of the patient were reviewed by me and considered in my medical decision making (see chart for details).  Clinical Course as of Oct 22 1320  Fri Oct 22, 2016  2446 >700cc urine came out with foley catheter   [EW]    Clinical Course User Index [EW] Trixie Dredge, PA-C   Afebrile, nontoxic patient with urinary retention without abdominal pain or stool impaction.  Pt also seen by Dr Nicanor Alcon.  Foley catheter placed with > 700cc urine drained.  UA does not appear infected.  Culture sent.  Labs remarkable for only mild hyperglycemia.   D/C home with foley catheter in place, flomax, urology follow up.   Discussed result, findings, treatment, and follow up  with patient.  Pt given return precautions.  Pt verbalizes understanding and agrees with plan.       Final Clinical Impressions(s) / ED Diagnoses   Final diagnoses:  Urinary retention    New Prescriptions Discharge Medication List as of 10/22/2016  8:21 AM    START taking these medications   Details  tamsulosin (FLOMAX) 0.4 MG CAPS capsule Take 1 capsule (0.4 mg total) by mouth daily., Starting Fri 10/22/2016, Print         [place], PA-C 10/22/16 1322    April Palumbo, MD 10/27/16 [id number]

## 2016-10-23 LAB — URINE CULTURE: CULTURE: NO GROWTH

## 2017-01-13 ENCOUNTER — Emergency Department (HOSPITAL_COMMUNITY): Payer: Medicare Other

## 2017-01-13 ENCOUNTER — Encounter (HOSPITAL_COMMUNITY): Payer: Self-pay | Admitting: Emergency Medicine

## 2017-01-13 ENCOUNTER — Inpatient Hospital Stay (HOSPITAL_COMMUNITY)
Admission: EM | Admit: 2017-01-13 | Discharge: 2017-01-15 | DRG: 064 | Disposition: A | Discharge status: Hospice | Payer: Medicare Other | Attending: Family Medicine | Admitting: Family Medicine

## 2017-01-13 DIAGNOSIS — I63411 Cerebral infarction due to embolism of right middle cerebral artery: Secondary | ICD-10-CM | POA: Diagnosis not present

## 2017-01-13 DIAGNOSIS — R2681 Unsteadiness on feet: Secondary | ICD-10-CM | POA: Diagnosis present

## 2017-01-13 DIAGNOSIS — K5900 Constipation, unspecified: Secondary | ICD-10-CM | POA: Diagnosis present

## 2017-01-13 DIAGNOSIS — Z7901 Long term (current) use of anticoagulants: Secondary | ICD-10-CM

## 2017-01-13 DIAGNOSIS — Z794 Long term (current) use of insulin: Secondary | ICD-10-CM

## 2017-01-13 DIAGNOSIS — N400 Enlarged prostate without lower urinary tract symptoms: Secondary | ICD-10-CM | POA: Diagnosis present

## 2017-01-13 DIAGNOSIS — I959 Hypotension, unspecified: Secondary | ICD-10-CM | POA: Diagnosis present

## 2017-01-13 DIAGNOSIS — R29898 Other symptoms and signs involving the musculoskeletal system: Secondary | ICD-10-CM | POA: Diagnosis not present

## 2017-01-13 DIAGNOSIS — W19XXXA Unspecified fall, initial encounter: Secondary | ICD-10-CM

## 2017-01-13 DIAGNOSIS — I482 Chronic atrial fibrillation: Secondary | ICD-10-CM | POA: Diagnosis present

## 2017-01-13 DIAGNOSIS — R278 Other lack of coordination: Secondary | ICD-10-CM | POA: Diagnosis not present

## 2017-01-13 DIAGNOSIS — I10 Essential (primary) hypertension: Secondary | ICD-10-CM | POA: Diagnosis not present

## 2017-01-13 DIAGNOSIS — G40909 Epilepsy, unspecified, not intractable, without status epilepticus: Secondary | ICD-10-CM | POA: Diagnosis present

## 2017-01-13 DIAGNOSIS — R2990 Unspecified symptoms and signs involving the nervous system: Secondary | ICD-10-CM | POA: Diagnosis present

## 2017-01-13 DIAGNOSIS — Z79899 Other long term (current) drug therapy: Secondary | ICD-10-CM | POA: Diagnosis not present

## 2017-01-13 DIAGNOSIS — G9349 Other encephalopathy: Secondary | ICD-10-CM | POA: Diagnosis not present

## 2017-01-13 DIAGNOSIS — F17200 Nicotine dependence, unspecified, uncomplicated: Secondary | ICD-10-CM | POA: Diagnosis present

## 2017-01-13 DIAGNOSIS — I639 Cerebral infarction, unspecified: Secondary | ICD-10-CM | POA: Diagnosis not present

## 2017-01-13 DIAGNOSIS — E119 Type 2 diabetes mellitus without complications: Secondary | ICD-10-CM | POA: Diagnosis present

## 2017-01-13 DIAGNOSIS — I480 Paroxysmal atrial fibrillation: Secondary | ICD-10-CM | POA: Diagnosis not present

## 2017-01-13 DIAGNOSIS — F209 Schizophrenia, unspecified: Secondary | ICD-10-CM | POA: Diagnosis present

## 2017-01-13 DIAGNOSIS — N2889 Other specified disorders of kidney and ureter: Secondary | ICD-10-CM | POA: Diagnosis present

## 2017-01-13 DIAGNOSIS — Y92129 Unspecified place in nursing home as the place of occurrence of the external cause: Secondary | ICD-10-CM | POA: Diagnosis not present

## 2017-01-13 DIAGNOSIS — I5020 Unspecified systolic (congestive) heart failure: Secondary | ICD-10-CM | POA: Diagnosis present

## 2017-01-13 DIAGNOSIS — W06XXXA Fall from bed, initial encounter: Secondary | ICD-10-CM | POA: Diagnosis present

## 2017-01-13 DIAGNOSIS — R001 Bradycardia, unspecified: Secondary | ICD-10-CM | POA: Diagnosis present

## 2017-01-13 DIAGNOSIS — H409 Unspecified glaucoma: Secondary | ICD-10-CM | POA: Diagnosis present

## 2017-01-13 DIAGNOSIS — E860 Dehydration: Secondary | ICD-10-CM | POA: Diagnosis not present

## 2017-01-13 DIAGNOSIS — G4700 Insomnia, unspecified: Secondary | ICD-10-CM | POA: Diagnosis present

## 2017-01-13 DIAGNOSIS — E872 Acidosis, unspecified: Secondary | ICD-10-CM

## 2017-01-13 DIAGNOSIS — F79 Unspecified intellectual disabilities: Secondary | ICD-10-CM | POA: Diagnosis present

## 2017-01-13 DIAGNOSIS — G8929 Other chronic pain: Secondary | ICD-10-CM | POA: Diagnosis present

## 2017-01-13 DIAGNOSIS — N179 Acute kidney failure, unspecified: Secondary | ICD-10-CM

## 2017-01-13 DIAGNOSIS — I360 Nonrheumatic tricuspid (valve) stenosis: Secondary | ICD-10-CM | POA: Diagnosis not present

## 2017-01-13 DIAGNOSIS — I429 Cardiomyopathy, unspecified: Secondary | ICD-10-CM | POA: Diagnosis present

## 2017-01-13 DIAGNOSIS — I110 Hypertensive heart disease with heart failure: Secondary | ICD-10-CM | POA: Diagnosis present

## 2017-01-13 DIAGNOSIS — Z0000 Encounter for general adult medical examination without abnormal findings: Secondary | ICD-10-CM | POA: Diagnosis present

## 2017-01-13 DIAGNOSIS — I9589 Other hypotension: Secondary | ICD-10-CM | POA: Diagnosis not present

## 2017-01-13 LAB — URINALYSIS, ROUTINE W REFLEX MICROSCOPIC
Bilirubin Urine: NEGATIVE
Glucose, UA: NEGATIVE mg/dL
Hgb urine dipstick: NEGATIVE
KETONES UR: NEGATIVE mg/dL
LEUKOCYTES UA: NEGATIVE
NITRITE: NEGATIVE
Protein, ur: NEGATIVE mg/dL
Specific Gravity, Urine: 1.005 (ref 1.005–1.030)
pH: 5 (ref 5.0–8.0)

## 2017-01-13 LAB — APTT: APTT: 30 s (ref 24–36)

## 2017-01-13 LAB — COMPREHENSIVE METABOLIC PANEL
ALBUMIN: 3.5 g/dL (ref 3.5–5.0)
ALT: 15 U/L — AB (ref 17–63)
AST: 29 U/L (ref 15–41)
Alkaline Phosphatase: 44 U/L (ref 38–126)
Anion gap: 10 (ref 5–15)
BUN: 9 mg/dL (ref 6–20)
CHLORIDE: 103 mmol/L (ref 101–111)
CO2: 25 mmol/L (ref 22–32)
Calcium: 9.1 mg/dL (ref 8.9–10.3)
Creatinine, Ser: 1.38 mg/dL — ABNORMAL HIGH (ref 0.61–1.24)
GFR calc Af Amer: 59 mL/min — ABNORMAL LOW (ref >60)
GFR, EST NON AFRICAN AMERICAN: 51 mL/min — AB (ref >60)
GLUCOSE: 118 mg/dL — AB (ref 65–99)
POTASSIUM: 4.1 mmol/L (ref 3.5–5.1)
SODIUM: 138 mmol/L (ref 135–145)
Total Bilirubin: 0.6 mg/dL (ref 0.3–1.2)
Total Protein: 5.8 g/dL — ABNORMAL LOW (ref 6.5–8.1)

## 2017-01-13 LAB — I-STAT TROPONIN, ED: TROPONIN I, POC: 0.02 ng/mL (ref 0.00–0.08)

## 2017-01-13 LAB — DIFFERENTIAL
BASOS ABS: 0 10*3/uL (ref 0.0–0.1)
BASOS PCT: 0 %
EOS ABS: 0 10*3/uL (ref 0.0–0.7)
Eosinophils Relative: 0 %
Lymphocytes Relative: 24 %
Lymphs Abs: 1.5 10*3/uL (ref 0.7–4.0)
Monocytes Absolute: 0.5 10*3/uL (ref 0.1–1.0)
Monocytes Relative: 8 %
NEUTROS ABS: 4 10*3/uL (ref 1.7–7.7)
NEUTROS PCT: 68 %

## 2017-01-13 LAB — CBC
HCT: 39.4 % (ref 39.0–52.0)
Hemoglobin: 13.5 g/dL (ref 13.0–17.0)
MCH: 31.6 pg (ref 26.0–34.0)
MCHC: 34.3 g/dL (ref 30.0–36.0)
MCV: 92.3 fL (ref 78.0–100.0)
PLATELETS: 209 10*3/uL (ref 150–400)
RBC: 4.27 MIL/uL (ref 4.22–5.81)
RDW: 13.7 % (ref 11.5–15.5)
WBC: 6 10*3/uL (ref 4.0–10.5)

## 2017-01-13 LAB — PROTIME-INR
INR: 1.49
Prothrombin Time: 18.1 seconds — ABNORMAL HIGH (ref 11.4–15.2)

## 2017-01-13 LAB — LACTIC ACID, PLASMA: Lactic Acid, Venous: 2.8 mmol/L (ref 0.5–1.9)

## 2017-01-13 LAB — GLUCOSE, CAPILLARY: Glucose-Capillary: 155 mg/dL — ABNORMAL HIGH (ref 65–99)

## 2017-01-13 LAB — MRSA PCR SCREENING: MRSA by PCR: NEGATIVE

## 2017-01-13 LAB — I-STAT CG4 LACTIC ACID, ED: Lactic Acid, Venous: 3.52 mmol/L (ref 0.5–1.9)

## 2017-01-13 MED ORDER — TIMOLOL HEMIHYDRATE 0.5 % OP SOLN: 1.0000 [drp] | Freq: Every day | OPHTHALMIC | Status: DC

## 2017-01-13 MED ORDER — VITAMIN D 1000 UNITS PO TABS
1000.0000 [IU] | ORAL_TABLET | Freq: Every morning | ORAL | Status: DC
  Administered 2017-01-14 – 2017-01-15 (×2): 1000 [IU] via ORAL
  Filled 2017-01-13 (×2): qty 1

## 2017-01-13 MED ORDER — STROKE: EARLY STAGES OF RECOVERY BOOK
Freq: Once | Status: AC
  Administered 2017-01-13: 20:00:00

## 2017-01-13 MED ORDER — ACETAMINOPHEN 325 MG PO TABS
650.0000 mg | ORAL_TABLET | ORAL | Status: DC | PRN
  Administered 2017-01-14: 650 mg via ORAL
  Filled 2017-01-13: qty 2

## 2017-01-13 MED ORDER — LATANOPROST 0.005 % OP SOLN
1.0000 [drp] | Freq: Every day | OPHTHALMIC | Status: DC
  Administered 2017-01-13 – 2017-01-14 (×2): 1 [drp] via OPHTHALMIC
  Filled 2017-01-13: qty 2.5

## 2017-01-13 MED ORDER — RISPERIDONE 0.5 MG PO TABS
2.0000 mg | ORAL_TABLET | Freq: Every day | ORAL | Status: DC
  Administered 2017-01-13 – 2017-01-14 (×2): 2 mg via ORAL
  Filled 2017-01-13 (×2): qty 4

## 2017-01-13 MED ORDER — ACETAMINOPHEN 160 MG/5ML PO SOLN: 650.0000 mg | ORAL | Status: DC | PRN

## 2017-01-13 MED ORDER — SODIUM CHLORIDE 0.9 % IV SOLN
INTRAVENOUS | Status: AC
  Administered 2017-01-13: 20:00:00 via INTRAVENOUS

## 2017-01-13 MED ORDER — INSULIN ASPART 100 UNIT/ML ~~LOC~~ SOLN
0.0000 [IU] | Freq: Three times a day (TID) | SUBCUTANEOUS | Status: DC
  Administered 2017-01-14: 3 [IU] via SUBCUTANEOUS
  Administered 2017-01-15: 1 [IU] via SUBCUTANEOUS

## 2017-01-13 MED ORDER — TAMSULOSIN HCL 0.4 MG PO CAPS
0.4000 mg | ORAL_CAPSULE | Freq: Every day | ORAL | Status: DC
  Administered 2017-01-14 – 2017-01-15 (×2): 0.4 mg via ORAL
  Filled 2017-01-13 (×2): qty 1

## 2017-01-13 MED ORDER — TIMOLOL MALEATE 0.5 % OP SOLN
1.0000 [drp] | Freq: Every day | OPHTHALMIC | Status: DC
  Administered 2017-01-14 – 2017-01-15 (×2): 1 [drp] via OPHTHALMIC
  Filled 2017-01-13: qty 5

## 2017-01-13 MED ORDER — SODIUM CHLORIDE 0.9 % IV BOLUS (SEPSIS)
1000.0000 mL | Freq: Once | INTRAVENOUS | Status: AC
  Administered 2017-01-13: 1000 mL via INTRAVENOUS

## 2017-01-13 MED ORDER — ENOXAPARIN SODIUM 40 MG/0.4ML ~~LOC~~ SOLN
40.0000 mg | SUBCUTANEOUS | Status: DC
  Administered 2017-01-13: 40 mg via SUBCUTANEOUS
  Filled 2017-01-13: qty 0.4

## 2017-01-13 MED ORDER — SENNOSIDES-DOCUSATE SODIUM 8.6-50 MG PO TABS
1.0000 | ORAL_TABLET | Freq: Every evening | ORAL | Status: DC | PRN
  Administered 2017-01-13: 1 via ORAL
  Filled 2017-01-13: qty 1

## 2017-01-13 MED ORDER — ACETAMINOPHEN 650 MG RE SUPP: 650.0000 mg | RECTAL | Status: DC | PRN

## 2017-01-13 MED ORDER — AMIODARONE HCL 200 MG PO TABS
200.0000 mg | ORAL_TABLET | Freq: Every day | ORAL | Status: DC
  Administered 2017-01-14 – 2017-01-15 (×2): 200 mg via ORAL
  Filled 2017-01-13 (×2): qty 1

## 2017-01-13 NOTE — ED Notes (Signed)
Attempted to call report x 1  

## 2017-01-13 NOTE — Progress Notes (Signed)
Patient arrived to 5M01 from ED. Safety precautions and orders reviewed with patient. VSS. TELE applied and confirmed. MD notified of pt arrival. MD caleld with orders for miralax, bladder scan x 1, and diet at this time. Family at bedside.   Sim Boast, RN

## 2017-01-13 NOTE — ED Notes (Signed)
Assisted pt with urinal

## 2017-01-13 NOTE — Progress Notes (Signed)
Patient transferred to 5M01, oriented to room, unit, staff, set up on tele, SCDs on, meal tray ordered. Sim Boast, RN aware of transfer.

## 2017-01-13 NOTE — Progress Notes (Signed)
Pt, admitted at shift change from ER, with stroke diagnosis, alert and oriented, verbal with soft speech, denies any pain, tele monitor on, v/s stable, pt incontinent of urine, condom cath in place, bladder scan done as ordered, read greater than at 2000, pt reassured and will continue to monitor. Obasogie-Asidi, Aqil Goetting Efe

## 2017-01-13 NOTE — ED Triage Notes (Signed)
Pt in from [facility] via [facility] after being found down at facility. Per facility, pt was "LSN at 0800, but had unsteady gait". On ED arrival, pt appears to be leaning on R side, but grip strength is equal, MAE's equally. A&Ox4, BP was low. 88/58 for EMS, CBG 122. Hx of seizures

## 2017-01-13 NOTE — ED Provider Notes (Signed)
[facility]-EMERGENCY DEPT Provider Note   CSN: [id number] Arrival date & time: 01/13/17  1212     History   Chief Complaint Chief Complaint  Patient presents with  . Gait Problem  . Fall  . stroke-like symptoms    HPI Jordan Rowe is a 71 y.o. male.  71 year old male with past medical history including schizophrenia, seizure disorder, type 2 diabetes mellitus, MR, A fib on Eliquis who p/w found down. EMS reports that the patient's nursing facility found him down, unable to recall events. They initially stated that he was last seen normal at 8 AM but later noted that he appeared to have an unsteady gait. He currently denies any unilateral weakness or numbness. He denies any headache or vision changes. He denies any complaints of pain.  LEVEL 5 CAVEAT DUE TO MR AND AMS   The history is provided by the EMS personnel and the nursing home.  Fall     Past Medical History:  Diagnosis Date  . Atrial fibrillation with RVR (HCC)    [name] 09/30/2015  . BPH (benign prostatic hypertrophy)    [name] 09/30/2015  . Constipation   . Edema   . GERD (gastroesophageal reflux disease)   . Glaucoma   . Hypertension   . Mental retardation    [name] 09/30/2015  . Schizophrenia (HCC)   . Seizure (HCC)   . Type II diabetes mellitus (HCC)    [name] 09/30/2015  . Vitamin D deficiency     Patient Active Problem List   Diagnosis Date Noted  . NSVT (nonsustained ventricular tachycardia) (HCC) 10/05/2015  . Cardiomyopathy (HCC)   . Smoker 10/01/2015  . Schizophrenia (HCC)   . New onset a-fib (HCC) 09/30/2015  . Atrial fibrillation with RVR (HCC) 09/30/2015  . Chest pain 09/30/2015  . Diabetes mellitus type 2, controlled (HCC) 09/30/2015  . Atrial fibrillation (HCC) 09/30/2015  . Pain in the chest   . 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 Diabetes mellitus (HCC) 02/02/2013  . Dehydration 02/02/2013  . Acute renal failure (HCC) 02/02/2013  . Hypoglycemia 02/02/2013  . MENTAL RETARDATION 02/12/2009  .  Essential hypertension 02/12/2009  . GERD 02/12/2009  . CONSTIPATION 02/12/2009  . CHEST PAIN 02/12/2009  . NAUSEA AND VOMITING 02/12/2009    Past Surgical History:  Procedure Laterality Date  . CATARACT EXTRACTION Left 12/2002   [name] 01/12/2011  . CATARACT EXTRACTION W/ INTRAOCULAR LENS IMPLANT Right 01/2011   [name] 02/18/2011  . MULTIPLE TOOTH EXTRACTIONS         Home Medications    Prior to Admission medications   Medication Sig Start Date End Date Taking? Authorizing Provider  acetaminophen (TYLENOL) 325 MG tablet Take 325-650 mg by mouth every 6 (six) hours as needed for mild pain or moderate pain.    Yes [provider]  amiodarone (PACERONE) 200 MG tablet Take 1 tablet (200 mg total) by mouth daily. 10/30/15  Yes Newman Nip, NP  apixaban (ELIQUIS) 5 MG TABS tablet Take 1 tablet (5 mg total) by mouth 2 (two) times daily. 10/07/15  Yes Christiane Ha, MD  carvedilol (COREG) 25 MG tablet Take 1 tablet (25 mg total) by mouth 2 (two) times daily with a meal. 10/07/15  Yes Christiane Ha, MD  cholecalciferol (VITAMIN D) 1000 UNITS tablet Take 1,000 Units by mouth every morning.    Yes [provider]  docusate sodium (COLACE) 100 MG capsule Take 100 mg by mouth 2 (two) times daily.   Yes [provider]  finasteride (PROSCAR) 5 MG tablet Take 5 mg by mouth every evening.    Yes [provider]  furosemide (LASIX) 20 MG tablet Take 20 mg by mouth daily.   Yes [provider]  hydrALAZINE (APRESOLINE) 25 MG tablet Take 1 tablet (25 mg total) by mouth 3 (three) times daily. 10/07/15  Yes Christiane Ha, MD  insulin glargine (LANTUS) 100 UNIT/ML injection Inject 0.05 mLs (5 Units total) into the skin at bedtime. Hold is FSBS is less than 75 Patient taking differently: Inject 8 Units into the skin at bedtime. Hold is FSBS is less than 75 10/07/15  Yes Lendell Caprice, Hedy Camara, MD  isosorbide mononitrate (ISMO,MONOKET) 20 MG tablet Take  20 mg by mouth 2 (two) times daily.   Yes [provider]  latanoprost (XALATAN) 0.005 % ophthalmic solution Place 1 drop into both eyes at bedtime.   Yes [provider]  lisinopril (PRINIVIL,ZESTRIL) 20 MG tablet Take 1 tablet (20 mg total) by mouth daily. 10/07/15  Yes Christiane Ha, MD  meloxicam (MOBIC) 15 MG tablet Take 15 mg by mouth daily as needed for pain.   Yes [provider]  omeprazole (PRILOSEC) 20 MG capsule Take 40 mg by mouth every evening. 30 min prior to evening meal.   Yes [provider]  Polyethyl Glycol-Propyl Glycol (SYSTANE OP) Place 1 drop into both eyes 4 (four) times daily.   Yes [provider]  Polyvinyl Alcohol-Povidone (REFRESH OP) Place 1 drop into both eyes 2 (two) times daily.   Yes [provider]  risperiDONE (RISPERDAL) 2 MG tablet Take 2 mg by mouth at bedtime.   Yes [provider]  senna (SENOKOT) 8.6 MG TABS Take 1-2 tablets by mouth See admin instructions. 1 tab in am, 2 tabs in pm   Yes [provider]  spironolactone (ALDACTONE) 25 MG tablet Take 1 tablet (25 mg total) by mouth daily. 10/07/15  Yes Christiane Ha, MD  tamsulosin (FLOMAX) 0.4 MG CAPS capsule Take 1 capsule (0.4 mg total) by mouth daily. 10/22/16  Yes West, Emily, PA-C  timolol (BETIMOL) 0.5 % ophthalmic solution Place 1 drop into both eyes daily.   Yes [provider]  traMADol (ULTRAM) 50 MG tablet Take 50 mg by mouth 3 (three) times daily. Reported on 09/30/2015   Yes [provider]  HYDROcodone-acetaminophen (NORCO/VICODIN) 5-325 MG tablet Take 1 tablet by mouth every 6 (six) hours as needed. Patient not taking: Reported on 10/21/2016 01/18/16   Cheri Fowler, PA-C    Family History Family History  Problem Relation Age of Onset  . Hypertension Mother     Social History Social History  Substance Use Topics  . Smoking status: Current Every Day Smoker  . Smokeless tobacco: Never Used  .  Alcohol use No     Allergies   Patient has no known allergies.   Review of Systems Review of Systems  Unable to perform ROS: Mental status change     Physical Exam Updated Vital Signs BP (!) 154/90   Pulse (!) 57   Temp 98.7 F (37.1 C)   Resp 17   Ht 5\' 10"  (1.778 m)   Wt 165 lb (74.8 kg)   SpO2 99%   BMI 23.68 kg/m   Physical Exam  Constitutional: He appears well-developed and well-nourished. No distress.  HENT:  Head: Normocephalic and atraumatic.  Moist mucous membranes  Eyes: Conjunctivae are normal. Pupils are equal, round, and reactive to light.  Neck: Neck  supple.  Cardiovascular: Regular rhythm and normal heart sounds.  Bradycardia present.   No murmur heard. Pulmonary/Chest: Effort normal and breath sounds normal.  Abdominal: Soft. Bowel sounds are normal. He exhibits no distension. There is no tenderness.  Musculoskeletal: He exhibits no edema.  Neurological: He is alert. No cranial nerve deficit or sensory deficit. He exhibits normal muscle tone.  Difficulty following commands but answering basic questions; 3/5 strength BUE, 4/5 strength BLE; dysmetria on L finger to nose testing; L pronator drift  Skin: Skin is warm and dry.  Nursing note and vitals reviewed.    ED Treatments / Results  Labs (all labs ordered are listed, but only abnormal results are displayed) Labs Reviewed  PROTIME-INR - Abnormal; Notable for the following:       Result Value   Prothrombin Time 18.1 (*)    All other components within normal limits  COMPREHENSIVE METABOLIC PANEL - Abnormal; Notable for the following:    Glucose, Bld 118 (*)    Creatinine, Ser 1.38 (*)    Total Protein 5.8 (*)    ALT 15 (*)    GFR calc non Af Amer 51 (*)    GFR calc Af Amer 59 (*)    All other components within normal limits  I-STAT CG4 LACTIC ACID, ED - Abnormal; Notable for the following:    Lactic Acid, Venous 3.52 (*)    All other components within normal limits  URINE CULTURE    CULTURE, BLOOD (ROUTINE X 2)  CULTURE, BLOOD (ROUTINE X 2)  APTT  CBC  DIFFERENTIAL  URINALYSIS, ROUTINE W REFLEX MICROSCOPIC  I-STAT TROPOININ, ED    EKG  EKG Interpretation  Date/Time:  Thursday Jan 13 2017 12:14:27 EDT Ventricular Rate:  56 PR Interval:    QRS Duration: 97 QT Interval:  516 QTC Calculation: 499 R Axis:   -5 Text Interpretation:  Sinus or ectopic atrial rhythm Borderline low voltage, extremity leads Abnormal R-wave progression, early transition Minimal ST depression, inferior leads Borderline prolonged QT interval No significant change since last tracing Confirmed by Frederick Peers [phone]) on 01/13/2017 12:27:11 PM       Radiology Ct Head Wo Contrast  Result Date: 01/13/2017 CLINICAL DATA:  Status post fall at the nursing facility EXAM: CT HEAD WITHOUT CONTRAST CT CERVICAL SPINE WITHOUT CONTRAST TECHNIQUE: Multidetector CT imaging of the head and cervical spine was performed following the standard protocol without intravenous contrast. Multiplanar CT image reconstructions of the cervical spine were also generated. COMPARISON:  02/01/2017 FINDINGS: CT HEAD FINDINGS Brain: No evidence of acute infarction, hemorrhage, hydrocephalus, extra-axial collection or mass lesion/mass effect. Prominence of the sulci and ventricles identified compatible with brain atrophy. There is marked low attenuation within the subcortical and periventricular white matter compatible with advanced chronic small vessel ischemic disease. Vascular: No hyperdense vessel or unexpected calcification. Skull: Normal. Negative for fracture or focal lesion. Sinuses/Orbits: No acute finding. Other: None. CT CERVICAL SPINE FINDINGS Alignment: The alignment of the cervical spine appears normal. The vertebral body heights are well preserved. The facet joints are well-aligned. The prevertebral soft tissue space appears normal. Skull base and vertebrae: No acute fracture. No primary bone lesion or focal  pathologic process. Soft tissues and spinal canal: No prevertebral fluid or swelling. No visible canal hematoma. Disc levels: Multi level disc space narrowing and ventral endplate spurring noted peer most advanced at C5-6 and C6-7. Upper chest: Negative. Other: None IMPRESSION: 1. No acute intracranial abnormality. 2. Chronic small vessel ischemic disease and brain atrophy  3. Cervical spondylosis.  No cervical spine fracture noted. Electronically Signed   By: Signa Kell M.D.   On: 01/13/2017 13:58   Ct Cervical Spine Wo Contrast  Result Date: 01/13/2017 CLINICAL DATA:  Status post fall at the nursing facility EXAM: CT HEAD WITHOUT CONTRAST CT CERVICAL SPINE WITHOUT CONTRAST TECHNIQUE: Multidetector CT imaging of the head and cervical spine was performed following the standard protocol without intravenous contrast. Multiplanar CT image reconstructions of the cervical spine were also generated. COMPARISON:  02/01/2017 FINDINGS: CT HEAD FINDINGS Brain: No evidence of acute infarction, hemorrhage, hydrocephalus, extra-axial collection or mass lesion/mass effect. Prominence of the sulci and ventricles identified compatible with brain atrophy. There is marked low attenuation within the subcortical and periventricular white matter compatible with advanced chronic small vessel ischemic disease. Vascular: No hyperdense vessel or unexpected calcification. Skull: Normal. Negative for fracture or focal lesion. Sinuses/Orbits: No acute finding. Other: None. CT CERVICAL SPINE FINDINGS Alignment: The alignment of the cervical spine appears normal. The vertebral body heights are well preserved. The facet joints are well-aligned. The prevertebral soft tissue space appears normal. Skull base and vertebrae: No acute fracture. No primary bone lesion or focal pathologic process. Soft tissues and spinal canal: No prevertebral fluid or swelling. No visible canal hematoma. Disc levels: Multi level disc space narrowing and ventral  endplate spurring noted peer most advanced at C5-6 and C6-7. Upper chest: Negative. Other: None IMPRESSION: 1. No acute intracranial abnormality. 2. Chronic small vessel ischemic disease and brain atrophy 3. Cervical spondylosis.  No cervical spine fracture noted. Electronically Signed   By: Signa Kell M.D.   On: 01/13/2017 13:58   Dg Chest Port 1 View  Result Date: 01/13/2017 CLINICAL DATA:  Hypotensive. EXAM: PORTABLE CHEST 1 VIEW COMPARISON:  Radiographs of September 30, 2015. FINDINGS: The heart size and mediastinal contours are within normal limits. Both lungs are clear. Atherosclerosis of thoracic aorta is noted. No pneumothorax or pleural effusion is noted. Old right rib fractures are noted. IMPRESSION: No acute cardiopulmonary abnormality seen.  Old right rib fractures. Electronically Signed   By: Lupita Raider, M.D.   On: 01/13/2017 13:36    Procedures Procedures (including critical care time)  Medications Ordered in ED Medications  sodium chloride 0.9 % bolus 1,000 mL (0 mLs Intravenous Stopped 01/13/17 1416)  sodium chloride 0.9 % bolus 1,000 mL (1,000 mLs Intravenous New Bag/Given 01/13/17 1422)     Initial Impression / Assessment and Plan / ED Course  I have reviewed the triage vital signs and the nursing notes.  Pertinent labs & imaging results that were available during my care of the patient were reviewed by me and considered in my medical decision making (see chart for details).     Pt brought in by EMS from Nursing facility where he was found down. On arrival, he was hypotensive at 90/56 but afebrile, heart rate in the 50s. He denied any complaints. He did have dysmetria on left finger to nose testing and left pronator drift. Obtained above lab work as well as head CT and portable chest x-ray. Gave an IV fluid bolus.  Labwork shows lactate of 3.5, normal troponin, negative UA, normal CBC, creatinine 1.38 which is mildly elevated from baseline. CT of head and C-spine  negative acute and no acute findings on chest x-ray. The patient's blood pressure quickly improved to mild hypertension after fluid bolus and given his AK I suspected dehydration may be contributing to his elevated lactate. He is otherwise afebrile and  without focal signs of infection therefore I have held on antibiotics although I have sent urine and blood cultures. I'm concerned about the possibility of stroke given his left arm weakness. I have ordered brain MRI and discussed with neurologist, Dr. Roxy Manns, who will see patient in consultation. Discussed admission with family medicine teaching service, who will admit the patient for further care. Final Clinical Impressions(s) / ED Diagnoses   Final diagnoses:  Left arm weakness  Fall, initial encounter  Lactic acidosis  AKI (acute kidney injury) [facility])    New Prescriptions New Prescriptions   No medications on file     Little, Ambrose Finland, MD 01/13/17 1544

## 2017-01-13 NOTE — H&P (Signed)
Family Medicine Teaching [facility] Admission History and Physical Service Pager: [phone]  Patient name: Jordan Rowe Medical record number: [id number] Date of birth: 1946-01-10 Age: 71 y.o. Gender: male  Primary Care Provider: System, Pcp Not In Consultants: neuro Code Status: full (obtained on admission)  Chief Complaint: fall  Assessment and Plan: Jordan Rowe is a 71 y.o. male presenting with unsteady gait and fall . PMH is significant for intellectual disability, hypertension, HFrEF, atrial fibrillation,? Diabetes, schizophrenia, BPH and renal mass.   Unsteady gait/fall: patient with "unsteady gait" and fall from bed this morning. Mechanism of fall is unknown. Neuro exam symmetric with no notable deficit except for BLE strength to 4/5. He is AAO 4. However, he was noted to have dysmetria with his left finger to nose and left pronator drift in ED. CT head and cervical spine without acute finding. MRI brain with 2 septal acute white matter infarcts in anterior and superior right frontal lobe and other chronic changes. Neurology was consulted in ED. He is already outside tPA window. Others on differential for his unsteady gait/fall are iatrogenic (patient on multiple antihypertensive and sedative medications), infection, seizure, arrhythmia, dehydration or mechanical fall. Patient has low BPs to 80s/60s and lactic acidosis to 3.52 on arrival to ED. In the absence of signs of infection or leukocytosis, hypotension and lactic acidosis is likely due to dehydration in patient with mental retardation living in facility but cannot rule out infectious process. Surprisingly no ketones in his urine.  UA is negative for UTI. Patient is on tramadol which could lower his seizure threshold if he happens to her history of seizure as listed in his past medical history. EKG with bradycardia and ectopic atrial rhythm. Initial troponin negative.  -Admit to MedSurg. Attending Jordan Rowe -Neuro consulted in  ED  -Carotid doppler  -APTT, PT/INR -ECG, Trop, Echo -Risk stratification labs (lipid panel, A1c and TSH) -UDS, Ethanol level -Neurovascular check q2hrs for 12 hours then q4hours -Cardiac monitor -Permissive hypertension -We will discuss about Aspirin.  -Holding Eliqus for now -Atorvastatin -PT/OT/SLP -NS @50  cc -Fall and seizure precaution -SCD pending MRI -SW consult (if from facility)  Lactic acidosis: Lactic acid elevated to 3.52. He was initially hypotensive to 80s over 60s in ED that as resolved with fluid resuscitation. He has no other signs of infection. UA negative. No leukocytosis. CXR without acute abnormality. -Trend lactic acid -hold home antihypertensive medications  Hypertension: Initially hypotensive to 80s/60s in ED. Hypotension resolved with fluid resuscitation. -? Permissive hypertension   Atrial fibrillation: [place] Vascscore 7. He is on amiodarone, Coreg and Eliquis at home.  -Continue home amiodarone  -Hold Coreg and Eliquis pending neuro recs  Bradycardia: Heart rate in 50s. EKG with ectopic atrial rhythm.  -Repeat EKG in the morning -Hold nodal blocking agents  HFrEF/CM: echo in 09/2016 with EF 25-30%, diffuse hypokinesis and G2DD. On Coreg, lisinopril, spironolactone, Lasix and BiDil at home. No respiratory symptoms or signs of fluid overload.  -Hold home meds pending neuro recs -Echocardiogram  Diabetes: No A1c in chart. On Lantus 5 units at bedtime  -A1c -SSI-thin -CBG before meals and at bedtime  Schizophrenia/intellectual disability/insomnia: On risperidone 2 mg at home  -Continue home risperidone 2 mg at bedtime   History of Seizure:  doesn't appear to be on any medication. Tramadol on his medication list which could lower his seizure threshold. -Discontinue tramadol   AKI: Serum creatinine elevated 1.38 on arrival. Baseline 1.02. Likely due to dehydration. -BMP in the morning -We will avoid nephrotoxic  drugs  Renal mass: a 5 x 2.6 x 3 cm hypoechoic area within the mid central left kidney noted on his renal ultrasound in 01/2013. Recommended elective MRI at that time. -Discussed this in the morning  Tobacco use: former smoker   BPH: Finasteride and tamsulosin on his medication list. Wears depends  -Bladder scan  -Continue tamsulosin   Chronic pain: Norco, tramadol and meloxicam on his medication list. -Discontinue tramadol -We'll resume Norco if needed  Constipation: stated that he hasn't had a bowel movement in a while. -MiraLAX once and then daily when necessary   FEN/GI:  -Modified and heart healthy diet  -NS 50 mL/h  -MiraLAX as above   Prophylaxis: Lovenox   Disposition: admit to MedSurg.   History of Present Illness:  Jordan Rowe is a 71 y.o. male presenting with fall and unsteady gait.  Patient reports a fall from bed about 8 AM this morning. Not sure about the mechanism of fall. He denies hitting his head or loss of consciousness. He denies symptoms leading to this. He says people at the facility helping him to get up. Per EMS report to ED triage nurse, "patient was found down at facility". Per facility report to ED tries nurse,  "LSN at 08:00, but unsteady gait".  Patient denies headache, vision change, fever, chills, chest pain, shortness of breath, nausea, vomiting, abdominal pain, diarrhea, dysuria, body ache, numbness, tingling or focal weakness.  He is not sure about the medications he takes at facility. Not sure about medication changes. Per ED note, ? AMS.   ED course: Vital signs significant for BP to 80s/60s on arrival that has resolved with 2 L of normal saline bolus. Lactic acid elevated to 3.5. CMP remarkable for creatinine to 1.36 is otherwise normal. CBC within normal limits. Initial troponin negative. EKG with bradycardia and ectopic sinus rhythm otherwise normal. UA negative. CXR and CT head without acute abnormalities. MRI head with 2 septal acute white  matter infarcts in anterior and superior right frontal lobe and other chronic changes. Neurology was consulted. Family medicine was called to admit patient for further evaluation and management.  Review Of Systems:   Review of Systems  Musculoskeletal: Positive for falls.   Review of systems negative except for pertinent positives and negatives in history of present illness above.   Patient Active Problem List   Diagnosis Date Noted  . Stroke-like symptom 01/13/2017  . NSVT (nonsustained ventricular tachycardia) (HCC) 10/05/2015  . Cardiomyopathy (HCC)   . Smoker 10/01/2015  . Schizophrenia (HCC)   . New onset a-fib (HCC) 09/30/2015  . Atrial fibrillation with RVR (HCC) 09/30/2015  . Chest pain 09/30/2015  . Diabetes mellitus type 2, controlled (HCC) 09/30/2015  . Atrial fibrillation (HCC) 09/30/2015  . Pain in the chest   . 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 Diabetes mellitus (HCC) 02/02/2013  . Dehydration 02/02/2013  . Acute renal failure (HCC) 02/02/2013  . Hypoglycemia 02/02/2013  . MENTAL RETARDATION 02/12/2009  . Essential hypertension 02/12/2009  . GERD 02/12/2009  . CONSTIPATION 02/12/2009  . CHEST PAIN 02/12/2009  . NAUSEA AND VOMITING 02/12/2009    Past Medical History: Past Medical History:  Diagnosis Date  . Atrial fibrillation with RVR (HCC)    [name] 09/30/2015  . BPH (benign prostatic hypertrophy)    [name] 09/30/2015  . Constipation   . Edema   . GERD (gastroesophageal reflux disease)   . Glaucoma   . Hypertension   . Mental retardation    [name] 09/30/2015  .  Schizophrenia (HCC)   . Seizure (HCC)   . Type II diabetes mellitus (HCC)    [name] 09/30/2015  . Vitamin D deficiency     Past Surgical History: Past Surgical History:  Procedure Laterality Date  . CATARACT EXTRACTION Left 12/2002   [name] 01/12/2011  . CATARACT EXTRACTION W/ INTRAOCULAR LENS IMPLANT Right 01/2011   [name] 02/18/2011  . MULTIPLE TOOTH EXTRACTIONS      Social History: Social  History  Substance Use Topics  . Smoking status: Current Every Day Smoker  . Smokeless tobacco: Never Used  . Alcohol use No   Additional social history: none  Please also refer to relevant sections of EMR.  Family History: Family History  Problem Relation Age of Onset  . Hypertension Mother    (If not completed, MUST add something in)  Allergies and Medications: No Known Allergies No current facility-administered medications on file prior to encounter.    Current Outpatient Prescriptions on File Prior to Encounter  Medication Sig Dispense Refill  . acetaminophen (TYLENOL) 325 MG tablet Take 325-650 mg by mouth every 6 (six) hours as needed for mild pain or moderate pain.     Marland Kitchen amiodarone (PACERONE) 200 MG tablet Take 1 tablet (200 mg total) by mouth daily.    Marland Kitchen apixaban (ELIQUIS) 5 MG TABS tablet Take 1 tablet (5 mg total) by mouth 2 (two) times daily. 60 tablet 0  . carvedilol (COREG) 25 MG tablet Take 1 tablet (25 mg total) by mouth 2 (two) times daily with a meal. 60 tablet 1  . cholecalciferol (VITAMIN D) 1000 UNITS tablet Take 1,000 Units by mouth every morning.     . docusate sodium (COLACE) 100 MG capsule Take 100 mg by mouth 2 (two) times daily.    . finasteride (PROSCAR) 5 MG tablet Take 5 mg by mouth every evening.     . furosemide (LASIX) 20 MG tablet Take 20 mg by mouth daily.    . hydrALAZINE (APRESOLINE) 25 MG tablet Take 1 tablet (25 mg total) by mouth 3 (three) times daily. 90 tablet 1  . insulin glargine (LANTUS) 100 UNIT/ML injection Inject 0.05 mLs (5 Units total) into the skin at bedtime. Hold is FSBS is less than 75 (Patient taking differently: Inject 8 Units into the skin at bedtime. Hold is FSBS is less than 75) 10 mL 11  . isosorbide mononitrate (ISMO,MONOKET) 20 MG tablet Take 20 mg by mouth 2 (two) times daily.    Marland Kitchen latanoprost (XALATAN) 0.005 % ophthalmic solution Place 1 drop into both eyes at bedtime.    Marland Kitchen lisinopril (PRINIVIL,ZESTRIL) 20 MG tablet Take  1 tablet (20 mg total) by mouth daily. 30 tablet 1  . meloxicam (MOBIC) 15 MG tablet Take 15 mg by mouth daily as needed for pain.    Marland Kitchen omeprazole (PRILOSEC) 20 MG capsule Take 40 mg by mouth every evening. 30 min prior to evening meal.    . Polyvinyl Alcohol-Povidone (REFRESH OP) Place 1 drop into both eyes 2 (two) times daily.    . risperiDONE (RISPERDAL) 2 MG tablet Take 2 mg by mouth at bedtime.    . senna (SENOKOT) 8.6 MG TABS Take 1-2 tablets by mouth See admin instructions. 1 tab in am, 2 tabs in pm    . spironolactone (ALDACTONE) 25 MG tablet Take 1 tablet (25 mg total) by mouth daily. 30 tablet 1  . tamsulosin (FLOMAX) 0.4 MG CAPS capsule Take 1 capsule (0.4 mg total) by mouth daily. 30 capsule 0  .  timolol (BETIMOL) 0.5 % ophthalmic solution Place 1 drop into both eyes daily.    . traMADol (ULTRAM) 50 MG tablet Take 50 mg by mouth 3 (three) times daily. Reported on 09/30/2015    . HYDROcodone-acetaminophen (NORCO/VICODIN) 5-325 MG tablet Take 1 tablet by mouth every 6 (six) hours as needed. (Patient not taking: Reported on 10/21/2016) 20 tablet 0    Objective: BP (!) 152/85   Pulse (!) 57   Temp 98.7 F (37.1 C)   Resp (!) 9   Ht 5\' 10"  (1.778 m)   Wt 165 lb (74.8 kg)   SpO2 99%   BMI 23.68 kg/m  Exam: GEN: appears frail, otherwise no apparent distress.  Head: normocephalic and atraumatic  Eyes: conjunctiva without injection, sclera anicteric. Pupils minimally reactive to light. History of cataract Nares: no rhinorrhea, congestion or erythema Oropharynx: mmm without erythema or exudation CVS: Bradycardia, RR, nl s1 & s2, no murmurs, no edema RESP: speaks in full sentence, no IWOB, good air movement bilaterally, CTAB GI: BS present & normal, soft, mild diffuse tenderness to palpation, no guarding, no rebound GU: no suprapubic or CVA tenderness. Wears depends MSK: no focal tenderness or notable swelling. Some muscular atrophy in his extremities SKIN: no apparent skin  lesion NEURO: AAO x4, pupils minimally reactive to light (cataract), otherwise cranial nerves grossly intact. No aphasia or facial drooping. Motor 5/5 in upper extremities bilaterally, 4/5 in lower extremities bilaterally. Patellar reflexes 1+ bilaterally. Sensation intact in all dermatomes of upper and lower extremities.  Labs and Imaging: CBC BMET   Recent Labs Lab 01/13/17 1214  WBC 6.0  HGB 13.5  HCT 39.4  PLT 209    Recent Labs Lab 01/13/17 1214  NA 138  K 4.1  CL 103  CO2 25  BUN 9  CREATININE 1.38*  GLUCOSE 118*  CALCIUM 9.1     Ct Head Wo Contrast  Result Date: 01/13/2017 CLINICAL DATA:  Status post fall at the nursing facility EXAM: CT HEAD WITHOUT CONTRAST CT CERVICAL SPINE WITHOUT CONTRAST TECHNIQUE: Multidetector CT imaging of the head and cervical spine was performed following the standard protocol without intravenous contrast. Multiplanar CT image reconstructions of the cervical spine were also generated. COMPARISON:  02/01/2017 FINDINGS: CT HEAD FINDINGS Brain: No evidence of acute infarction, hemorrhage, hydrocephalus, extra-axial collection or mass lesion/mass effect. Prominence of the sulci and ventricles identified compatible with brain atrophy. There is marked low attenuation within the subcortical and periventricular white matter compatible with advanced chronic small vessel ischemic disease. Vascular: No hyperdense vessel or unexpected calcification. Skull: Normal. Negative for fracture or focal lesion. Sinuses/Orbits: No acute finding. Other: None. CT CERVICAL SPINE FINDINGS Alignment: The alignment of the cervical spine appears normal. The vertebral body heights are well preserved. The facet joints are well-aligned. The prevertebral soft tissue space appears normal. Skull base and vertebrae: No acute fracture. No primary bone lesion or focal pathologic process. Soft tissues and spinal canal: No prevertebral fluid or swelling. No visible canal hematoma. Disc levels:  Multi level disc space narrowing and ventral endplate spurring noted peer most advanced at C5-6 and C6-7. Upper chest: Negative. Other: None IMPRESSION: 1. No acute intracranial abnormality. 2. Chronic small vessel ischemic disease and brain atrophy 3. Cervical spondylosis.  No cervical spine fracture noted. Electronically Signed   By: Signa Kell M.D.   On: 01/13/2017 13:58   Ct Cervical Spine Wo Contrast  Result Date: 01/13/2017 CLINICAL DATA:  Status post fall at the nursing facility EXAM: CT HEAD  WITHOUT CONTRAST CT CERVICAL SPINE WITHOUT CONTRAST TECHNIQUE: Multidetector CT imaging of the head and cervical spine was performed following the standard protocol without intravenous contrast. Multiplanar CT image reconstructions of the cervical spine were also generated. COMPARISON:  02/01/2017 FINDINGS: CT HEAD FINDINGS Brain: No evidence of acute infarction, hemorrhage, hydrocephalus, extra-axial collection or mass lesion/mass effect. Prominence of the sulci and ventricles identified compatible with brain atrophy. There is marked low attenuation within the subcortical and periventricular white matter compatible with advanced chronic small vessel ischemic disease. Vascular: No hyperdense vessel or unexpected calcification. Skull: Normal. Negative for fracture or focal lesion. Sinuses/Orbits: No acute finding. Other: None. CT CERVICAL SPINE FINDINGS Alignment: The alignment of the cervical spine appears normal. The vertebral body heights are well preserved. The facet joints are well-aligned. The prevertebral soft tissue space appears normal. Skull base and vertebrae: No acute fracture. No primary bone lesion or focal pathologic process. Soft tissues and spinal canal: No prevertebral fluid or swelling. No visible canal hematoma. Disc levels: Multi level disc space narrowing and ventral endplate spurring noted peer most advanced at C5-6 and C6-7. Upper chest: Negative. Other: None IMPRESSION: 1. No acute  intracranial abnormality. 2. Chronic small vessel ischemic disease and brain atrophy 3. Cervical spondylosis.  No cervical spine fracture noted. Electronically Signed   By: Signa Kell M.D.   On: 01/13/2017 13:58   Mr Brain Wo Contrast (neuro Protocol)  Result Date: 01/13/2017 CLINICAL DATA:  71 year old male found down at nursing facility. Left pronator drift. EXAM: MRI HEAD WITHOUT CONTRAST TECHNIQUE: Multiplanar, multiecho pulse sequences of the brain and surrounding structures were obtained without intravenous contrast. COMPARISON:  Head and cervical spine CT 1304 hours today. FINDINGS: Brain: There is a small gyral or subcortical white matter 5-6 mm focus of restricted diffusion along the anterior right frontal lobe on series 3, image 37. There is a questionable small additional right superior frontal gyrus white matter infarct on image 43. No other convincing restricted diffusion. Superimposed right anterior superior frontal gyrus cortical encephalomalacia and confluent bilateral cerebral white matter T2 and FLAIR hyperintensity (series 4, image 22). There is a punctate microhemorrhage in this region on series 5, image 19, but no malignant or definitely acute intracranial hemorrhage associated. No intracranial mass effect. In addition to the confluent bilateral cerebral white matter signal changes there is a prominent chronic lacunar infarct in the central right thalamus. Left deep gray matter nuclei, the brainstem, and cerebellum are negative. No ventriculomegaly. Cervicomedullary junction and pituitary are within normal limits. Vascular: Major intracranial vascular flow voids are preserved. There is mild generalized intracranial artery ectasia. Skull and upper cervical spine: Negative. Sinuses/Orbits: Negative. Visualized paranasal sinuses and mastoids are stable and well pneumatized. Other: Visible internal auditory structures appear normal. Negative scalp soft tissues. IMPRESSION: 1. Two subtle  acute white matter infarcts in the anterior and superior right frontal lobe, superimposed on chronic encephalomalacia. No associated hemorrhage or mass effect. 2. Additional bilateral confluent cerebral white matter disease favored due to chronic small vessel ischemia. Chronic lacunar infarct in the right thalamus. Electronically Signed   By: Odessa Fleming M.D.   On: 01/13/2017 17:28   Dg Chest Port 1 View  Result Date: 01/13/2017 CLINICAL DATA:  Hypotensive. EXAM: PORTABLE CHEST 1 VIEW COMPARISON:  Radiographs of September 30, 2015. FINDINGS: The heart size and mediastinal contours are within normal limits. Both lungs are clear. Atherosclerosis of thoracic aorta is noted. No pneumothorax or pleural effusion is noted. Old right rib fractures are noted. IMPRESSION:  No acute cardiopulmonary abnormality seen.  Old right rib fractures. Electronically Signed   By: Lupita Raider, M.D.   On: 01/13/2017 13:36    Almon Hercules, MD 01/13/2017, 5:47 PM PGY-2, [facility] FPTS Intern pager: [phone], text pages welcome

## 2017-01-13 NOTE — ED Notes (Signed)
Patient transported to CT 

## 2017-01-13 NOTE — ED Notes (Signed)
Attempted to call report x2

## 2017-01-13 NOTE — ED Notes (Signed)
Dr. Alanda Slim rounding at bedside

## 2017-01-14 ENCOUNTER — Inpatient Hospital Stay (HOSPITAL_COMMUNITY): Payer: Medicare Other

## 2017-01-14 DIAGNOSIS — I639 Cerebral infarction, unspecified: Secondary | ICD-10-CM

## 2017-01-14 DIAGNOSIS — G9349 Other encephalopathy: Secondary | ICD-10-CM

## 2017-01-14 DIAGNOSIS — R278 Other lack of coordination: Secondary | ICD-10-CM

## 2017-01-14 DIAGNOSIS — N179 Acute kidney failure, unspecified: Secondary | ICD-10-CM

## 2017-01-14 DIAGNOSIS — W19XXXA Unspecified fall, initial encounter: Secondary | ICD-10-CM

## 2017-01-14 DIAGNOSIS — R29898 Other symptoms and signs involving the musculoskeletal system: Secondary | ICD-10-CM

## 2017-01-14 DIAGNOSIS — E872 Acidosis, unspecified: Secondary | ICD-10-CM

## 2017-01-14 DIAGNOSIS — I360 Nonrheumatic tricuspid (valve) stenosis: Secondary | ICD-10-CM

## 2017-01-14 LAB — BASIC METABOLIC PANEL
Anion gap: 9 (ref 5–15)
BUN: 8 mg/dL (ref 6–20)
CO2: 24 mmol/L (ref 22–32)
CREATININE: 1.08 mg/dL (ref 0.61–1.24)
Calcium: 8.5 mg/dL — ABNORMAL LOW (ref 8.9–10.3)
Chloride: 108 mmol/L (ref 101–111)
Glucose, Bld: 72 mg/dL (ref 65–99)
POTASSIUM: 3.5 mmol/L (ref 3.5–5.1)
SODIUM: 141 mmol/L (ref 135–145)

## 2017-01-14 LAB — LACTIC ACID, PLASMA
LACTIC ACID, VENOUS: 2 mmol/L — AB (ref 0.5–1.9)
LACTIC ACID, VENOUS: 2.5 mmol/L — AB (ref 0.5–1.9)
Lactic Acid, Venous: 2.7 mmol/L (ref 0.5–1.9)
Lactic Acid, Venous: 2.1 mmol/L (ref 0.5–1.9)

## 2017-01-14 LAB — URINE CULTURE
Culture: <10000 — AB
Special Requests: NORMAL

## 2017-01-14 LAB — ECHOCARDIOGRAM COMPLETE
CHL CUP MV DEC (S): 391
CHL CUP TV REG PEAK VELOCITY: 246 cm/s
EERAT: 8.59
EWDT: 391 ms
FS: 22 % — AB (ref 28–44)
HEIGHTINCHES: 70 in
IV/PV OW: 0.87
LA diam end sys: 39 mm
LA diam index: 2.02 cm/m2
LASIZE: 39 mm
LAVOL: 64.3 mL
LAVOLA4C: 67.5 mL
LAVOLIN: 33.4 mL/m2
LV E/e' medial: 8.59
LV e' LATERAL: 9.62 cm/s
LVEEAVG: 8.59
LVOT SV: 84 mL
LVOT VTI: 24.4 cm
LVOT area: 3.46 cm2
LVOT diameter: 21 mm
LVOTPV: 105 cm/s
Lateral S' vel: 12.9 cm/s
MV Peak grad: 3 mmHg
MVPKAVEL: 36.6 m/s
MVPKEVEL: 82.6 m/s
PW: 10.4 mm — AB (ref 0.6–1.1)
RV TAPSE: 25.3 mm
RV sys press: 27 mmHg
TDI e' lateral: 9.62
TDI e' medial: 5.08
TR max vel: 246 cm/s
WEIGHTICAEL: 2640 [oz_av]

## 2017-01-14 LAB — LIPID PANEL
CHOL/HDL RATIO: 2.7 ratio
Cholesterol: 133 mg/dL (ref 0–200)
HDL: 49 mg/dL (ref >40)
LDL Cholesterol: 68 mg/dL (ref 0–99)
Triglycerides: 78 mg/dL (ref <150)
VLDL: 16 mg/dL (ref 0–40)

## 2017-01-14 LAB — GLUCOSE, CAPILLARY
GLUCOSE-CAPILLARY: 93 mg/dL (ref 65–99)
GLUCOSE-CAPILLARY: 231 mg/dL — AB (ref 65–99)
Glucose-Capillary: 72 mg/dL (ref 65–99)
Glucose-Capillary: 119 mg/dL — ABNORMAL HIGH (ref 65–99)

## 2017-01-14 LAB — TROPONIN I

## 2017-01-14 MED ORDER — APIXABAN 5 MG PO TABS
5.0000 mg | ORAL_TABLET | Freq: Two times a day (BID) | ORAL | Status: DC
  Administered 2017-01-14 – 2017-01-15 (×2): 5 mg via ORAL
  Filled 2017-01-14 (×3): qty 1

## 2017-01-14 MED ORDER — SODIUM CHLORIDE 0.9 % IV SOLN
INTRAVENOUS | Status: AC
  Administered 2017-01-14: 22:00:00 via INTRAVENOUS

## 2017-01-14 MED ORDER — IOPAMIDOL (ISOVUE-370) INJECTION 76%
INTRAVENOUS | Status: AC
  Administered 2017-01-14: 50 mL
  Filled 2017-01-14: qty 50

## 2017-01-14 NOTE — H&P (Signed)
I saw and examined this patient.  Discussed with Dr. Alanda Slim.  Agree with his documentation and management.  Briefly, 71 yo male who is a resident of assisted living facility and has some significant underlying intellectual impairment presents with new weakness - perhaps left sided, a fall.  WU in the ER reveils that he has a small acute stroke.  Also had AKI, likely mild dehydration and lactic acidosis on admit.  No evidence of infection.  Lactic acidosis is responding to hydration and creat is falling.  Caveat: my history is limited.  Jordan Rowe is a very poor historian.  About all he would tell me today is that his left foot hurt (no focal bone tenderness on exam).  I do not know what his baseline is.

## 2017-01-14 NOTE — Progress Notes (Signed)
Ct called have patient glasses there from testing earlier; will send up

## 2017-01-14 NOTE — Evaluation (Signed)
Speech Language Pathology Evaluation Patient Details Name: Jordan Rowe MRN: [id number] DOB: November 20, 1945 Today's Date: 01/14/2017 Time: 4098-1191 SLP Time Calculation (min) (ACUTE ONLY): 16 min  Problem List:  Patient Active Problem List   Diagnosis Date Noted  . Acute ischemic stroke (HCC)   . Fall   . AKI (acute kidney injury) (HCC)   . Lactic acidosis   . Stroke-like symptom 01/13/2017  . NSVT (nonsustained ventricular tachycardia) (HCC) 10/05/2015  . Cardiomyopathy (HCC)   . Smoker 10/01/2015  . Schizophrenia (HCC)   . New onset a-fib (HCC) 09/30/2015  . Atrial fibrillation with RVR (HCC) 09/30/2015  . Chest pain 09/30/2015  . Diabetes mellitus type 2, controlled (HCC) 09/30/2015  . Atrial fibrillation (HCC) 09/30/2015  . Pain in the chest   . 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 Diabetes mellitus (HCC) 02/02/2013  . Dehydration 02/02/2013  . Acute renal failure (HCC) 02/02/2013  . Hypoglycemia 02/02/2013  . MENTAL RETARDATION 02/12/2009  . Essential hypertension 02/12/2009  . GERD 02/12/2009  . CONSTIPATION 02/12/2009  . CHEST PAIN 02/12/2009  . NAUSEA AND VOMITING 02/12/2009   Past Medical History:  Past Medical History:  Diagnosis Date  . Atrial fibrillation with RVR (HCC)    [name] 09/30/2015  . BPH (benign prostatic hypertrophy)    [name] 09/30/2015  . Constipation   . Edema   . GERD (gastroesophageal reflux disease)   . Glaucoma   . Hypertension   . Mental retardation    [name] 09/30/2015  . Schizophrenia (HCC)   . Seizure (HCC)   . Type II diabetes mellitus (HCC)    [name] 09/30/2015  . Vitamin D deficiency    Past Surgical History:  Past Surgical History:  Procedure Laterality Date  . CATARACT EXTRACTION Left 12/2002   [name] 01/12/2011  . CATARACT EXTRACTION W/ INTRAOCULAR LENS IMPLANT Right 01/2011   [name] 02/18/2011  . MULTIPLE TOOTH EXTRACTIONS     HPI:  pt is a 71 yo male adm to [facility] after falling at his ALF. PMH + for schizophrenia, seizure d/o,, DM2,  intellectual disability, GERD, former smoker, known esophageal dysmotility.  Pt CXR negative, brain MRI showed right frontal acute white matter CVA.  Speech evaluation ordered.  No family present.   Assessment / Plan / Recommendation Clinical Impression  Modified assessment completed secondary to pt's known h/o intellectual disability.  He is able to articulate his needs, answers questions in single sentences responses with intact receptive/expressive language and articulation abilities.  Facial weakness (labial) on left noted, lingual deviation to right upon protrusion and ? extra pyramidal lingual movement at rest.  Pt speaks at low intensity - and SlP suspects this is exacerbated compared to baseline.  Pt's brother Jordan Rowe phoned the pt and reported difficulty hearing Jordan Rowe.  Advised pt that amplifier may be option if voice strength does not return.  Jordan Rowe admits to worsening strength in phonation abilities - with max cues he can increase strength but needs ongoing cueing - ? impact of baseline medical conditions.  Pt also demonstrated decreased basic problem solving skills as he was attempting to call RN via phone instead of call bell he has just used a few minute prior.  SLP will follow up as uncertain to baseline support at ALF for pt and need to assure pt has support needed and can phonate adequately to meet needs.  Pt agreeable to SlP follow up.     SLP Assessment  SLP Recommendation/Assessment: Patient needs continued Speech [facility] Pathology Services  Follow Up Recommendations  Home health SLP    Frequency and Duration min 1 x/week  1 week      SLP Evaluation Cognition  Overall Cognitive Status: No family/caregiver present to determine baseline cognitive functioning Arousal/Alertness: Awake/alert Orientation Level: Oriented X4 Attention: Sustained;Selective Sustained Attention: Appears intact Selective Attention: Impaired Selective Attention Impairment: Functional  complex Problem Solving: Impaired (needed assist to locate call bell to reach for assistance - grabbed phone - after had used call bell 5 minutes earlier) Problem Solving Impairment: Functional basic       Comprehension  Auditory Comprehension Overall Auditory Comprehension: Appears within functional limits for tasks assessed Yes/No Questions: Not tested Commands: Within Functional Limits (2 step directions) Conversation: Simple Visual Recognition/Discrimination Discrimination: Not tested Reading Comprehension Reading Status: Not tested    Expression Expression Primary Mode of Expression: Verbal Verbal Expression Overall Verbal Expression: Appears within functional limits for tasks assessed Initiation: No impairment Level of Generative/Spontaneous Verbalization: Sentence Repetition: No impairment Naming: Not tested Pragmatics: No impairment (flat affect, suspect baseline due to premorbid diagnosis) Non-Verbal Means of Communication: Not applicable Written Expression Dominant Hand: Right Written Expression: Not tested   Oral / Motor  Oral Motor/Sensory Function Overall Oral Motor/Sensory Function: Mild impairment Facial Strength: Reduced left Lingual ROM: Reduced right (deviates right) Lingual Symmetry: Within Functional Limits Lingual Strength: Other (Comment) (appearance of extrapyramidal movements) Motor Speech Overall Motor Speech: Impaired Respiration: Impaired Level of Impairment: Word Phonation: Low vocal intensity Resonance: Within functional limits Articulation: Within functional limitis Intelligibility: Intelligible Motor Planning: Witnin functional limits Motor Speech Errors: Not applicable Interfering Components: Premorbid status Effective Techniques: Increased vocal intensity   GO                    Donavan Burnet, MS [facility] SLP [phone]

## 2017-01-14 NOTE — Consult Note (Signed)
Neurology Consult Note  Reason for Consultation: ? Dysmetria on the L  Requesting provider: Frederick Peers, MD  CC: pain in the L leg  HPI: This is a 32-yo RH man who presented to the [facility] ED on 01/13/17 after he fell at his nursing facility. History is largely obtained from the review of the patient's medical record as he has a static encephalopathy that limits his ability to provide much information.   According to notes, he is a resident at [facility]. He was apparently found down by staff sometime yesterday morning. They called 911 and EMS brought him to the ED. Triage RN reported that he appeared to be leaning to his right with no focal weakness. Initial BP was 88/58 with EMS. Per ED MD note, he had difficulty following commands but was able to answer "basic questions." He was documented to have 3/5 strength in BUE and 4/5 strength in BLE. She also noted a L pronator drift and dysmetria with L finger-to-nose. Initial labs in the ED were notable for lactate of 3.5, negative UA, normal CBC, and mildly elevated creatinine. He was bradycardic and hypotensive initially with good improvement in BP after fluid bolus. He was sent for an MRI of the brain and this showed two small areas of acute ischemic stroke. Neurology is consulted for further recommendations.   Last known well: 0800 01/14/17 NHISS score: 1  tPA given?: No, outside of window   PMH:  Past Medical History:  Diagnosis Date  . Atrial fibrillation with RVR (HCC)    [name] 09/30/2015  . BPH (benign prostatic hypertrophy)    [name] 09/30/2015  . Constipation   . Edema   . GERD (gastroesophageal reflux disease)   . Glaucoma   . Hypertension   . Mental retardation    [name] 09/30/2015  . Schizophrenia (HCC)   . Seizure (HCC)   . Type II diabetes mellitus (HCC)    [name] 09/30/2015  . Vitamin D deficiency     PSH:  Past Surgical History:  Procedure Laterality Date  . CATARACT EXTRACTION Left 12/2002   [name] 01/12/2011   . CATARACT EXTRACTION W/ INTRAOCULAR LENS IMPLANT Right 01/2011   [name] 02/18/2011  . MULTIPLE TOOTH EXTRACTIONS      Family history: Family History  Problem Relation Age of Onset  . Hypertension Mother     Social history:  Social History   Social History  . Marital status: Divorced    Spouse name: N/A  . Number of children: N/A  . Years of education: N/A   Occupational History  . Not on file.   Social History Main Topics  . Smoking status: Current Every Day Smoker  . Smokeless tobacco: Never Used  . Alcohol use No  . Drug use: No  . Sexual activity: Not on file   Other Topics Concern  . Not on file   Social History Narrative  . No narrative on file    Current outpatient meds: Medications reviewed and reconciled Current Meds  Medication Sig  . acetaminophen (TYLENOL) 325 MG tablet Take 325-650 mg by mouth every 6 (six) hours as needed for mild pain or moderate pain.   Marland Kitchen amiodarone (PACERONE) 200 MG tablet Take 1 tablet (200 mg total) by mouth daily.  Marland Kitchen apixaban (ELIQUIS) 5 MG TABS tablet Take 1 tablet (5 mg total) by mouth 2 (two) times daily.  . carvedilol (COREG) 25 MG tablet Take 1 tablet (25 mg total) by mouth 2 (two) times daily with a  meal.  . cholecalciferol (VITAMIN D) 1000 UNITS tablet Take 1,000 Units by mouth every morning.   . docusate sodium (COLACE) 100 MG capsule Take 100 mg by mouth 2 (two) times daily.  . finasteride (PROSCAR) 5 MG tablet Take 5 mg by mouth every evening.   . furosemide (LASIX) 20 MG tablet Take 20 mg by mouth daily.  . hydrALAZINE (APRESOLINE) 25 MG tablet Take 1 tablet (25 mg total) by mouth 3 (three) times daily.  . insulin glargine (LANTUS) 100 UNIT/ML injection Inject 0.05 mLs (5 Units total) into the skin at bedtime. Hold is FSBS is less than 75 (Patient taking differently: Inject 8 Units into the skin at bedtime. Hold is FSBS is less than 75)  . isosorbide mononitrate (ISMO,MONOKET) 20 MG tablet Take 20 mg by mouth 2 (two)  times daily.  Marland Kitchen latanoprost (XALATAN) 0.005 % ophthalmic solution Place 1 drop into both eyes at bedtime.  Marland Kitchen lisinopril (PRINIVIL,ZESTRIL) 20 MG tablet Take 1 tablet (20 mg total) by mouth daily.  . meloxicam (MOBIC) 15 MG tablet Take 15 mg by mouth daily as needed for pain.  Marland Kitchen omeprazole (PRILOSEC) 20 MG capsule Take 40 mg by mouth every evening. 30 min prior to evening meal.  . Polyethyl Glycol-Propyl Glycol (SYSTANE OP) Place 1 drop into both eyes 4 (four) times daily.  . Polyvinyl Alcohol-Povidone (REFRESH OP) Place 1 drop into both eyes 2 (two) times daily.  . risperiDONE (RISPERDAL) 2 MG tablet Take 2 mg by mouth at bedtime.  . senna (SENOKOT) 8.6 MG TABS Take 1-2 tablets by mouth See admin instructions. 1 tab in am, 2 tabs in pm  . spironolactone (ALDACTONE) 25 MG tablet Take 1 tablet (25 mg total) by mouth daily.  . tamsulosin (FLOMAX) 0.4 MG CAPS capsule Take 1 capsule (0.4 mg total) by mouth daily.  . timolol (BETIMOL) 0.5 % ophthalmic solution Place 1 drop into both eyes daily.  . traMADol (ULTRAM) 50 MG tablet Take 50 mg by mouth 3 (three) times daily. Reported on 09/30/2015    Current inpatient meds: Medications reviewed and reconciled Current Facility-Administered Medications  Medication Dose Route Frequency Provider Last Rate Last Dose  . 0.9 %  sodium chloride infusion   Intravenous Continuous Almon Hercules, MD 75 mL/hr at 01/14/17 0738    . acetaminophen (TYLENOL) tablet 650 mg  650 mg Oral Q4H PRN Candelaria Stagers T, MD   650 mg at 01/14/17 0951   Or  . acetaminophen (TYLENOL) solution 650 mg  650 mg Per Tube Q4H PRN Almon Hercules, MD       Or  . acetaminophen (TYLENOL) suppository 650 mg  650 mg Rectal Q4H PRN Gonfa, Taye T, MD      . amiodarone (PACERONE) tablet 200 mg  200 mg Oral Daily Candelaria Stagers T, MD   200 mg at 01/14/17 0951  . cholecalciferol (VITAMIN D) tablet 1,000 Units  1,000 Units Oral q morning - 10a Almon Hercules, MD   1,000 Units at 01/14/17 0951  . enoxaparin  (LOVENOX) injection 40 mg  40 mg Subcutaneous Q24H Candelaria Stagers T, MD   40 mg at 01/13/17 2144  . insulin aspart (novoLOG) injection 0-9 Units  0-9 Units Subcutaneous TID WC Gonfa, Taye T, MD      . latanoprost (XALATAN) 0.005 % ophthalmic solution 1 drop  1 drop Both Eyes QHS Candelaria Stagers T, MD   1 drop at 01/13/17 2145  . risperiDONE (RISPERDAL) tablet 2 mg  2  mg Oral QHS Candelaria Stagers T, MD   2 mg at 01/13/17 2147  . senna-docusate (Senokot-S) tablet 1 tablet  1 tablet Oral QHS PRN Almon Hercules, MD   1 tablet at 01/13/17 2147  . tamsulosin (FLOMAX) capsule 0.4 mg  0.4 mg Oral Daily Candelaria Stagers T, MD   0.4 mg at 01/14/17 0951  . timolol (TIMOPTIC) 0.5 % ophthalmic solution 1 drop  1 drop Both Eyes Daily Hensel, Santiago Bumpers, MD        Allergies: No Known Allergies  ROS: As per HPI. A full 14-point review of systems was performed and is otherwise notable for some pain in the left thigh and foot. Remainder of ROS is otherwise negative.   PE:  BP 130/87 (BP Location: Right Arm)   Pulse 63   Temp 98.5 F (36.9 C) (Oral)   Resp 18   Ht 5\' 10"  (1.778 m)   Wt 74.8 kg (165 lb)   SpO2 96%   BMI 23.68 kg/m   General: WDWN, no acute distress. Alert, oriented to self, [facility]. Not oriented to time for me. There is a paucity of spontaneous speech. He has mild dysarthria, no clear aphasia. Affect is flat. Comportment is normal.  HEENT: Normocephalic. Neck supple without LAD. MMM, OP clear. Sclerae anicteric. No conjunctival injection.  CV: Regular, no murmur. Carotid pulses full and symmetric, no bruits. Distal pulses 2+ and symmetric.  Lungs: CTAB.  Abdomen: Soft, non-distended, non-tender. Bowel sounds present x4.  Extremities: No C/C/E. He c/o some TTP in the left quadriceps and foot.  Neuro:  CN: Pupils are equal and round. They are symmetrically reactive from 3-->2 mm. Visual fields are full. EOMI without nystagmus. There is some breakup of smooth pursuits in all directions. No reported  diplopia. Facial sensation is intact to light touch. Face is symmetric at rest with normal strength and mobility. Hearing is intact to conversational voice. Palate elevates symmetrically and uvula is midline. Voice is normal in tone, pitch and quality. Bilateral SCM and trapezii are 5/5. Tongue is midline with normal bulk and mobility.  Motor: Muscle bulk is mildly diminished throughout. There is normal tone with some mitgehen paratonia. He has normal strength in BUE with mild proximal weakness in BLE. No tremor or other abnormal movements. He has a left drift.  Sensation: Intact to light touch though sensory exam is somewhat limited but diminished attention. DTRs: 2+, brisker on the L than the R. Toes mute bilaterally.  Coordination: Finger-to-nose is notable for mild dysmetria on the L. Finger taps are normal in amplitude and speed, no decrement.    Labs:  Lab Results  Component Value Date   WBC 6.0 01/13/2017   HGB 13.5 01/13/2017   HCT 39.4 01/13/2017   PLT 209 01/13/2017   GLUCOSE 72 01/14/2017   CHOL 133 01/14/2017   TRIG 78 01/14/2017   HDL 49 01/14/2017   LDLCALC 68 01/14/2017   ALT 15 (L) 01/13/2017   AST 29 01/13/2017   NA 141 01/14/2017   K 3.5 01/14/2017   CL 108 01/14/2017   CREATININE 1.08 01/14/2017   BUN 8 01/14/2017   CO2 24 01/14/2017   TSH 0.474 10/21/2016   PSA 0.39 12/11/2008   INR 1.49 01/13/2017   HGBA1C 6.7 (H) 02/02/2013   MICROALBUR 0.50 01/23/2009   Troponin 0.02 UA negative Ucx <10K colonies "insignificant growth" Lactate 3.52-->2.8  Imaging:  I have personally and independently reviewed the MRI brain without contrast from 01/13/17. This shows  two punctate areas of restricted diffusion in the R frontal white matter consistent with acute ischemic infarcts. There is a severe burden of chronic small vessel ischemic disease in the bihemispheric white matter. There is a focal area of encephalomalacia in the anterior R frontal lobe. There is an old lacune  in the R thalamus. There is moderate diffuse generalized atrophy with hydrocephalus ex-vacuo. The visualized intracranial flow voids appear unremarkable.   Other diagnostic studies:  TTE pending CUS pending  Assessment and Plan:  1. Acute Ischemic Stroke: This is an acute stroke involving the R MCA territory. It is most likely embolic in etiology. Known risk factors for cerebrovascular disease in this patient include afib, DM, HTN, age. Carotid Dopplers, TTE and hemoglobin a1c are pending. Continue Eliquis for secondary prevention. LDL is presently at goal. Ensure adequate glucose control. Allow permissive hypertension in the acute phase, treating only SBP greater than 220 mmHg and/or DBP greater than 110 mmHg. Avoid fever and hyperglycemia as these are associated with worse neurologic recoveries. Initiate rehab services. DVT prophylaxis as needed.   2. Left dysmetria: This appears to be out of proportion to the infarcts noted on his MRI scan. This may be in part chronic but it's difficult to tell as I am not sure exactly what his baseline is. OT/rehabilitation as needed.  3. BLE weakness: He has some mild proximal weakness in both legs which is suggestive of chronic deconditioning. PT/rehabilitation as needed.  4. 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 I suspect that he is likely at his usual baseline. His static encephalopathy places him at increased risk for delirium from multiple causes and may result in delayed recovery from same. Continue to optimize metabolic status as you are. Limit CNS active medications, particularly benzodiazepines, opiates, and anything with strong anticholinergic properties. Every effort should be made to regulate sleep-wake cycles, keeping his room bright and active by day and dark and quiet by night.

## 2017-01-14 NOTE — Progress Notes (Signed)
Inpatient Rehabilitation  Update, received call from PT stating that therapy team is now recommending [facility] for post acute care therapies.  As a result, will defer consult at this time.  Please call with questions.   Charlane Ferretti., CCC/SLP Admission Coordinator  [facility] Inpatient Rehabilitation  Cell [phone]

## 2017-01-14 NOTE — Progress Notes (Signed)
Critical lab value received lactic acid level trending back down to 2.1.Marland KitchenMarland KitchenMarland Kitchenwill continue to monitor and provide iv fluid therapy as prescribed by the provider

## 2017-01-14 NOTE — Discharge Instructions (Signed)

## 2017-01-14 NOTE — Progress Notes (Signed)
Inpatient Rehabilitation  Per PT request, patient was screened by Shweta Aman for appropriateness for an Inpatient Acute Rehab consult.  At this time we are recommending an Inpatient Rehab consult.  Text paged MD; please order if you are agreeable.    Jeanann Balinski, M.A., CCC/SLP Admission Coordinator  [facility]  Cell [phone]  

## 2017-01-14 NOTE — Evaluation (Signed)
Occupational Therapy Evaluation Patient Details Name: Jordan Rowe MRN: [id number] DOB: [birth year] Today's Date: 01/14/2017    History of Present Illness Jordan Rowe is a 71 y.o. male presenting with unsteady gait and fall . PMH is significant for intellectual disability, hypertension, HFrEF, atrial fibrillation,? Diabetes, schizophrenia, BPH and renal mass.  MRI shows two subtle acute white matter infarcts in the anterior and superior right frontal lobe.   Clinical Impression   PTA Pt min A for ADL and mod I for mobility with RW. Pt currently mod A for ADL and min A for mobility with RW. Please see OT problem list below. Pt will benefit from skilled OT in the acute setting and afterwards with HHOT to maximize safety and independence in ADL and functional transfers to return to PLOF. Next session to focus on standing balance and further functional assessment of LUE as assisting hand (Pt is right handed) for grooming tasks as Pt was completing these tasks at more independent level.     Follow Up Recommendations  Home health OT;Supervision/Assistance - 24 hour    Equipment Recommendations  3 in 1 bedside commode    Recommendations for Other Services       Precautions / Restrictions Precautions Precautions: Fall Restrictions Weight Bearing Restrictions: No      Mobility Bed Mobility               General bed mobility comments: Pt sitting OOB in recliner when OT entered  Transfers Overall transfer level: Needs assistance   Transfers: Sit to/from Stand Sit to Stand: Min assist         General transfer comment: steady assist, cues for hand placement    Balance Overall balance assessment: Needs assistance Sitting-balance support: No upper extremity supported;Feet supported Sitting balance-Leahy Scale: Fair Sitting balance - Comments: in recliner   Standing balance support: Bilateral upper extremity supported;Single extremity supported Standing balance-Leahy  Scale: Poor Standing balance comment: reliant on RW, and assist from therapist                           ADL either performed or assessed with clinical judgement   ADL Overall ADL's : Needs assistance/impaired Eating/Feeding: Minimal assistance;Sitting;Cueing for compensatory techinques Eating/Feeding Details (indicate cue type and reason): able to open containers with increased time, able to grip fork and scoop corn, difficulty managing hamburger Grooming: Wash/dry hands;Wash/dry face;Set up;Sitting Grooming Details (indicate cue type and reason): in recliner Upper Body Bathing: Minimal assistance;Sitting   Lower Body Bathing: Moderate assistance;Sitting/lateral leans   Upper Body Dressing : Minimal assistance;Sitting   Lower Body Dressing: Moderate assistance;Sit to/from stand   Toilet Transfer: Minimal assistance;Ambulation;RW Toilet Transfer Details (indicate cue type and reason): simulated with transfer to recliner Toileting- Clothing Manipulation and Hygiene: Moderate assistance   Tub/ Shower Transfer: Moderate assistance   Functional mobility during ADLs: Minimal assistance;Rolling walker;Cueing for sequencing General ADL Comments: Hard to decifer what baseline is from Pt, he states that he was getting assist daily for bathing and dressing at his ALF     Vision         Perception     Praxis      Pertinent Vitals/Pain Pain Assessment: 0-10 Pain Score: 10-Worst pain ever Pain Location: LLE Pain Descriptors / Indicators: Sore Pain Intervention(s): Limited activity within patient's tolerance;Monitored during session     Hand Dominance Right   Extremity/Trunk Assessment Upper Extremity Assessment Upper Extremity Assessment: Generalized weakness;LUE deficits/detail LUE Deficits / Details: grossly  able to complete all movements (touch back of head, grasp, full extension, touch lower back), grossly 4/5 strength, slightly weaker than right. States that  sensation is intact   Lower Extremity Assessment Lower Extremity Assessment: Defer to PT evaluation   Cervical / Trunk Assessment Cervical / Trunk Assessment: Normal   Communication Communication Communication: No difficulties;Other (comment) (very soft spoken)   Cognition Arousal/Alertness: Awake/alert;Lethargic Behavior During Therapy: WFL for tasks assessed/performed Overall Cognitive Status: History of cognitive impairments - at baseline                                     General Comments       Exercises     Shoulder Instructions      Home Living Family/patient expects to be discharged to:: Assisted living     Type of Home: Assisted living                       Home Equipment: Walker - 2 wheels   Additional Comments: per pt someone helps with All ADLs including daily dressing and bathing 2 times a week. He does stand to shower typically.      Prior Functioning/Environment Level of Independence: Needs assistance  Gait / Transfers Assistance Needed: Used RW in the facility, but ambulate (with RW) unsupervised ADL's / Homemaking Assistance Needed: Pt reports that he has assitance getting dressed each day and showering 2x a week            OT Problem List: Decreased strength;Impaired balance (sitting and/or standing);Decreased safety awareness;Decreased knowledge of use of DME or AE;Impaired sensation;Pain      OT Treatment/Interventions: Self-care/ADL training;Neuromuscular education;Energy conservation;DME and/or AE instruction;Patient/family education;Balance training    OT Goals(Current goals can be found in the care plan section) Acute Rehab OT Goals Patient Stated Goal: back moving better OT Goal Formulation: With patient Time For Goal Achievement: 01/27/17 Potential to Achieve Goals: Good ADL Goals Pt Will Perform Grooming: with min guard assist;standing Pt Will Perform Upper Body Bathing: with min guard assist;sitting Pt Will  Perform Lower Body Bathing: sit to/from stand;with min guard assist Pt Will Transfer to Toilet: with modified independence;ambulating (with RW) Pt Will Perform Toileting - Clothing Manipulation and hygiene: with modified independence;sit to/from stand Additional ADL Goal #1: Pt will verbally express 3 compensatory strategies to conserve energy during ADL with less than 2 verbal cues  OT Frequency: Min 2X/week   Barriers to D/C:            Co-evaluation              AM-PAC PT "6 Clicks" Daily Activity     Outcome Measure Help from another person eating meals?: A Little Help from another person taking care of personal grooming?: A Little Help from another person toileting, which includes using toliet, bedpan, or urinal?: A Little Help from another person bathing (including washing, rinsing, drying)?: A Lot Help from another person to put on and taking off regular upper body clothing?: A Little Help from another person to put on and taking off regular lower body clothing?: A Lot 6 Click Score: 16   End of Session Equipment Utilized During Treatment: Gait belt;Rolling walker Nurse Communication: Mobility status  Activity Tolerance: Patient tolerated treatment well Patient left: in chair;with call bell/phone within reach;with chair alarm set  OT Visit Diagnosis: Unsteadiness on feet (R26.81);Muscle weakness (generalized) (M62.81);Other symptoms and signs  involving the nervous system (R29.898);Pain Pain - Right/Left: Left Pain - part of body: Leg                Time: 0092-3300 OT Time Calculation (min): 17 min Charges:  OT General Charges $OT Visit: 1 Procedure OT Evaluation $OT Eval Moderate Complexity: 1 Procedure G-Codes:     Sherryl Manges OTR/L [phone] Evern Bio Stanlee Roehrig 01/14/2017, 5:21 PM

## 2017-01-14 NOTE — Progress Notes (Signed)
Paged Family medicine oncall provider to notify of lactic acid value of 2.5

## 2017-01-14 NOTE — Progress Notes (Signed)
VASCULAR LAB PRELIMINARY  PRELIMINARY  PRELIMINARY  PRELIMINARY  Carotid duplex completed.    Preliminary report:  Bilateral:  1-39% ICA stenosis.  Vertebral artery flow is antegrade.     Nyjae Hodge, RVS 01/14/2017, 2:51 PM

## 2017-01-14 NOTE — Evaluation (Addendum)
Physical Therapy Evaluation Patient Details Name: Jordan Rowe MRN: [id number] DOB: 1946/08/12 Today's Date: 01/14/2017   History of Present Illness  Jordan Rowe is a 71 y.o. male presenting with unsteady gait and fall . PMH is significant for intellectual disability, hypertension, HFrEF, atrial fibrillation,? Diabetes, schizophrenia, BPH and renal mass.  MRI shows two subtle acute white matter infarcts in the anterior and superior right frontal lobe.  Clinical Impression  Pt admitted with/for gait instability and fall, found by MRI to have 2 small right frontal infarcts.  Pt needing minimal assist overall for basic mobility and gait.  Pt currently limited functionally due to the problems listed. ( See problems list.)   Pt will benefit from PT to maximize function and safety in order to get ready for next venue listed below.     Follow Up Recommendations Home health PT    Equipment Recommendations  None recommended by PT    Recommendations for Other Services Rehab consult     Precautions / Restrictions Precautions Precautions: Fall      Mobility  Bed Mobility Overal bed mobility: Needs Assistance Bed Mobility: Supine to Sit     Supine to sit: Min assist     General bed mobility comments: extra long time to EOB, needed scoot assist  Transfers Overall transfer level: Needs assistance   Transfers: Sit to/from Stand Sit to Stand: Min assist         General transfer comment: steady assist, cues for hand placement  Ambulation/Gait Ambulation/Gait assistance: Min assist Ambulation Distance (Feet): 170 Feet Assistive device: Rolling walker (2 wheeled) Gait Pattern/deviations: Step-through pattern   Gait velocity interpretation: Below normal speed for age/gender General Gait Details: stable hemiparetic gait with L foot flat contact and worsening lag as he fatigued.  Stairs            Wheelchair Mobility    Modified Rankin (Stroke Patients Only) Modified  Rankin (Stroke Patients Only) Pre-Morbid Rankin Score: No symptoms Modified Rankin: Moderately severe disability     Balance Overall balance assessment: Needs assistance   Sitting balance-Leahy Scale: Fair     Standing balance support: Bilateral upper extremity supported;Single extremity supported Standing balance-Leahy Scale: Poor Standing balance comment: needs some external stability assist                             Pertinent Vitals/Pain Pain Assessment: 0-10 Pain Score: 10-Worst pain ever Pain Location: l medial leg below knee Pain Descriptors / Indicators: Sore Pain Intervention(s): Monitored during session    Home Living Family/patient expects to be discharged to:: Assisted living     Type of Home: Assisted living         Home Equipment: Walker - 2 wheels Additional Comments: per pt someone helps with All ADLs including daily dressing and bathing 2 times a week. He does stand to shower typically.    Prior Function Level of Independence: Needs assistance   Gait / Transfers Assistance Needed: Used RW in the facility, but ambulate (with RW) unsupervised           Hand Dominance   Dominant Hand: Right    Extremity/Trunk Assessment   Upper Extremity Assessment Upper Extremity Assessment: Defer to OT evaluation    Lower Extremity Assessment Lower Extremity Assessment: Generalized weakness;RLE deficits/detail;LLE deficits/detail RLE Deficits / Details: grossly 4-/5 and decrease coordination LLE Deficits / Details: grossly >=3+/5 and uncoordinated LLE Sensation: decreased light touch LLE Coordination: decreased fine motor  Communication   Communication: No difficulties;Other (comment) (soft spoken)  Cognition Arousal/Alertness: Awake/alert;Lethargic Behavior During Therapy: WFL for tasks assessed/performed Overall Cognitive Status: History of cognitive impairments - at baseline                                         General Comments      Exercises     Assessment/Plan    PT Assessment Patient needs continued PT services  PT Problem List Decreased strength;Decreased activity tolerance;Decreased balance;Decreased mobility;Decreased coordination;Impaired sensation;Pain       PT Treatment Interventions Gait training;Functional mobility training;Therapeutic activities;Balance training;Patient/family education    PT Goals (Current goals can be found in the Care Plan section)  Acute Rehab PT Goals Patient Stated Goal: back moving better PT Goal Formulation: With patient Time For Goal Achievement: 01/28/17 Potential to Achieve Goals: Good    Frequency Min 3X/week   Barriers to discharge        Co-evaluation               AM-PAC PT "6 Clicks" Daily Activity  Outcome Measure Difficulty turning over in bed (including adjusting bedclothes, sheets and blankets)?: A Little Difficulty moving from lying on back to sitting on the side of the bed? : Total Difficulty sitting down on and standing up from a chair with arms (e.g., wheelchair, bedside commode, etc,.)?: A Little Help needed moving to and from a bed to chair (including a wheelchair)?: A Little Help needed walking in hospital room?: A Little Help needed climbing 3-5 steps with a railing? : A Lot 6 Click Score: 15    End of Session   Activity Tolerance: Patient tolerated treatment well Patient left: in chair;with call bell/phone within reach;with chair alarm set Nurse Communication: Mobility status PT Visit Diagnosis: Unsteadiness on feet (R26.81);Hemiplegia and hemiparesis;Muscle weakness (generalized) (M62.81) Hemiplegia - Right/Left: Left Hemiplegia - dominant/non-dominant: Non-dominant Hemiplegia - caused by: Cerebral infarction    Time: 1006-1050 PT Time Calculation (min) (ACUTE ONLY): 44 min   Charges:   PT Evaluation $PT Eval Moderate Complexity: 1 Procedure PT Treatments $Gait Training: 8-22 mins $Therapeutic  Activity: 8-22 mins   PT G Codes:        15-Jan-2017  Dwight Bing, PT [phone]  (pager)  Eliseo Gum Roddie Riegler 2017/01/15, 2:23 PM

## 2017-01-14 NOTE — Progress Notes (Signed)
Family Medicine Teaching Service Daily Progress Note Intern Pager: [phone]  Patient name: Jordan Rowe Medical record number: [id number] Date of birth: 04-Dec-1945 Age: 71 y.o. Gender: male  Primary Care Provider: System, Pcp Not In Consultants: Neurology Code Status: FULL  Pt Overview and Major Events to Date:  Admit 5/17  Assessment and Plan: Jordan Rowe is a 71 y.o. male presenting with unsteady gait and fall . PMH is significant for intellectual disability, hypertension, HFrEF, atrial fibrillation,? Diabetes, schizophrenia, BPH and renal mass.    Unsteady gait/fall: Patient with "unsteady gait" and fall from bed this morning. Mechanism of fall is unknown. Neuro exam symmetric with no notable deficit except for BLE strength to 4/5. He is AAO 4. However, he was noted to have dysmetria with his left finger to nose and left pronator drift in ED. CT head and cervical spine without acute finding. MRI brain with 2 septal acute white matter infarcts in anterior and superior right frontal lobe and other chronic changes. Neurology was consulted in ED. He is already outside tPA window. Others on differential for his unsteady gait/fall are iatrogenic (patient on multiple antihypertensive and sedative medications), infection, seizure, arrhythmia, dehydration or mechanical fall. Patient has low BPs to 80s/60s and lactic acidosis to 3.52 on arrival to ED. In the absence of signs of infection or leukocytosis, hypotension and lactic acidosis is likely due to dehydration in patient with mental retardation living in facility but cannot rule out infectious process. Surprisingly no ketones in his urine.  UA is negative for UTI. Patient is on tramadol which could lower his seizure threshold if he happens to her history of seizure as listed in his past medical history. EKG with bradycardia and ectopic atrial rhythm. Initial troponin negative and subsequent trops neg.  APTT wnl, PT 18.1, INR wnl.  Lipid panel  normal.   -Neurology consulted, appreciate recs             -Carotid dopplers pending  - Echo pending  -Risk stratification labs- A1c and TSH pending  -Eliquis initially held due to concern for hemorrhagic conversion.  Per discussion with Neurology, ok to restart Eliquis for now.  -Neurovascular check q2hrs for 12 hours then q4 hours  -Cardiac monitor -Permissive hypertension -Atorvastatin -PT/OT/SLP recs -NS @75  cc  -Fall and seizure precaution -SW consult (if from facility)  Lactic acidosis: Lactic acid elevated to 3.52. He was initially hypotensive to 80s over 60s in ED that as resolved with fluid resuscitation. He has no other signs of infection. UA negative. No leukocytosis. CXR without acute abnormality.  -Trend lactic acid 3.52>2.7.   -hold home antihypertensive medications  Hypertension: Initially hypotensive to 80s/60s in ED. Hypotension resolved with fluid resuscitation.  Normotensive this AM to 130/87.   -Monitor BPs  Atrial fibrillation: [place] Vascscore 7. He is on amiodarone, Coreg and Eliquis at home.  No history of DVT or PE per chart review.   -Continue home amiodarone  -Hold Coreg and Eliquis pending neuro recs   Bradycardia: Heart rate in 50s. EKG with ectopic atrial rhythm.   -Repeat EKG in the morning -Hold nodal blocking agents  HFrEF/CM: echo in 09/2016 with EF 25-30%, diffuse hypokinesis and G2DD. On Coreg, lisinopril, spironolactone, Lasix and BiDil at home. No respiratory symptoms or signs of fluid overload.  -Hold home meds pending neuro recs -Echocardiogram  Diabetes: No A1c in chart. On Lantus 5 units at bedtime  -A1c -SSI-thin -CBG before meals and at bedtime  Schizophrenia/intellectual disability/insomnia: On risperidone 2 mg at home  -  Continue home risperidone 2 mg at bedtime   History of Seizure:  doesn't appear to be on any medication. Tramadol on his medication list which could lower his seizure  threshold. -Discontinue tramadol   AKI: Serum creatinine elevated 1.38 on arrival. Improving to 1.08 this AM.  Baseline 1.02. Likely due to dehydration. -Daily BMET  -We will avoid nephrotoxic drugs  Renal mass: a 5 x 2.6 x 3 cm hypoechoic area within the mid central left kidney noted on his renal ultrasound in 01/2013. Recommended elective MRI at that time.  Tobacco use: former smoker   BPH: Finasteride and tamsulosin on his medication list. Wears depends  -Bladder scan  -Continue tamsulosin   Chronic pain: Norco, tramadol and meloxicam on his medication list. -Discontinue tramadol -Resume Norco if needed  Constipation: stated that he hasn't had a bowel movement in a while. -MiraLAX once and then daily when necessary   FEN/GI:  -Modified and heart healthy diet  -NS 75 mL/h  -Miralax as above   Disposition: Dispo pending clinical improvement.   Subjective:  Patient awake and oriented this AM.  No concerns overnight.  Speech is normal but slow and no slurring noted.  Denies chest pain, shortness of breath.    Objective: Temp:  [98.2 F (36.8 C)-98.7 F (37.1 C)] 98.5 F (36.9 C) (05/18 0600) Pulse Rate:  [50-69] 63 (05/18 0800) Resp:  [9-24] 18 (05/18 0800) BP: (101-167)/(55-98) 130/87 (05/18 0800) SpO2:  [93 %-100 %] 96 % (05/18 0800)   Physical Exam: GEN: appears frail, NAD.  Head: NCAT  Eyes: EOMI, PERRL CVS: RRR no MRG  RESP: CTAB, no increased WOB  GI: soft, NTND, +bs MSK: no tenderness or edema present  SKIN: warm, dry  NEURO: AAO x4, CN II-XII grossly intact.  No facial droop or slurring of speech.  Motor strength 5/5 in upper extremities bilaterally, 4/5 in lower extremities bilaterally. Sensation intact.   Laboratory:  Recent Labs Lab 01/13/17 1214  WBC 6.0  HGB 13.5  HCT 39.4  PLT 209    Recent Labs Lab 01/13/17 1214 01/14/17 0400  NA 138 141  K 4.1 3.5  CL 103 108  CO2 25 24  BUN 9 8  CREATININE 1.38* 1.08  CALCIUM 9.1 8.5*   PROT 5.8*  --   BILITOT 0.6  --   ALKPHOS 44  --   ALT 15*  --   AST 29  --   GLUCOSE 118* 72   Imaging/Diagnostic Tests: Mr Brain Wo Contrast (neuro Protocol)  Result Date: 01/13/2017 CLINICAL DATA:  71 year old male found down at nursing facility. Left pronator drift. EXAM: MRI HEAD WITHOUT CONTRAST TECHNIQUE: Multiplanar, multiecho pulse sequences of the brain and surrounding structures were obtained without intravenous contrast. COMPARISON:  Head and cervical spine CT 1304 hours today. FINDINGS: Brain: There is a small gyral or subcortical white matter 5-6 mm focus of restricted diffusion along the anterior right frontal lobe on series 3, image 37. There is a questionable small additional right superior frontal gyrus white matter infarct on image 43. No other convincing restricted diffusion. Superimposed right anterior superior frontal gyrus cortical encephalomalacia and confluent bilateral cerebral white matter T2 and FLAIR hyperintensity (series 4, image 22). There is a punctate microhemorrhage in this region on series 5, image 19, but no malignant or definitely acute intracranial hemorrhage associated. No intracranial mass effect. In addition to the confluent bilateral cerebral white matter signal changes there is a prominent chronic lacunar infarct in the central right thalamus. Left  deep gray matter nuclei, the brainstem, and cerebellum are negative. No ventriculomegaly. Cervicomedullary junction and pituitary are within normal limits. Vascular: Major intracranial vascular flow voids are preserved. There is mild generalized intracranial artery ectasia. Skull and upper cervical spine: Negative. Sinuses/Orbits: Negative. Visualized paranasal sinuses and mastoids are stable and well pneumatized. Other: Visible internal auditory structures appear normal. Negative scalp soft tissues. IMPRESSION: 1. Two subtle acute white matter infarcts in the anterior and superior right frontal lobe, superimposed on  chronic encephalomalacia. No associated hemorrhage or mass effect. 2. Additional bilateral confluent cerebral white matter disease favored due to chronic small vessel ischemia. Chronic lacunar infarct in the right thalamus. Electronically Signed   By: Odessa Fleming M.D.   On: 01/13/2017 17:28    Freddrick March, MD 01/14/2017, 1:44 PM PGY-1, [facility] Family [facility] FPTS Intern pager: [phone], text pages welcome

## 2017-01-14 NOTE — Progress Notes (Signed)
  Echocardiogram 2D Echocardiogram has been performed.  Delcie Roch 01/14/2017, 2:38 PM

## 2017-01-15 ENCOUNTER — Encounter (HOSPITAL_COMMUNITY): Payer: Self-pay | Admitting: Emergency Medicine

## 2017-01-15 ENCOUNTER — Emergency Department (HOSPITAL_COMMUNITY)
Admission: EM | Admit: 2017-01-15 | Discharge: 2017-01-16 | Disposition: A | Discharge status: Home / Self Care | Payer: Medicare Other | Attending: Emergency Medicine | Admitting: Emergency Medicine

## 2017-01-15 DIAGNOSIS — I639 Cerebral infarction, unspecified: Secondary | ICD-10-CM

## 2017-01-15 DIAGNOSIS — Z79899 Other long term (current) drug therapy: Secondary | ICD-10-CM | POA: Insufficient documentation

## 2017-01-15 DIAGNOSIS — N179 Acute kidney failure, unspecified: Secondary | ICD-10-CM

## 2017-01-15 DIAGNOSIS — I480 Paroxysmal atrial fibrillation: Secondary | ICD-10-CM

## 2017-01-15 DIAGNOSIS — I9589 Other hypotension: Secondary | ICD-10-CM

## 2017-01-15 DIAGNOSIS — E860 Dehydration: Secondary | ICD-10-CM

## 2017-01-15 DIAGNOSIS — F17200 Nicotine dependence, unspecified, uncomplicated: Secondary | ICD-10-CM | POA: Insufficient documentation

## 2017-01-15 DIAGNOSIS — Z794 Long term (current) use of insulin: Secondary | ICD-10-CM | POA: Insufficient documentation

## 2017-01-15 DIAGNOSIS — Z7901 Long term (current) use of anticoagulants: Secondary | ICD-10-CM | POA: Insufficient documentation

## 2017-01-15 DIAGNOSIS — E119 Type 2 diabetes mellitus without complications: Secondary | ICD-10-CM | POA: Insufficient documentation

## 2017-01-15 DIAGNOSIS — I10 Essential (primary) hypertension: Secondary | ICD-10-CM | POA: Insufficient documentation

## 2017-01-15 LAB — CBC
HEMATOCRIT: 37.7 % — AB (ref 39.0–52.0)
HEMOGLOBIN: 12.3 g/dL — AB (ref 13.0–17.0)
MCH: 30.5 pg (ref 26.0–34.0)
MCHC: 32.6 g/dL (ref 30.0–36.0)
MCV: 93.5 fL (ref 78.0–100.0)
Platelets: 200 10*3/uL (ref 150–400)
RBC: 4.03 MIL/uL — ABNORMAL LOW (ref 4.22–5.81)
RDW: 14 % (ref 11.5–15.5)
WBC: 6.5 10*3/uL (ref 4.0–10.5)

## 2017-01-15 LAB — BASIC METABOLIC PANEL
ANION GAP: 8 (ref 5–15)
BUN: 7 mg/dL (ref 6–20)
CALCIUM: 8.5 mg/dL — AB (ref 8.9–10.3)
CO2: 22 mmol/L (ref 22–32)
Chloride: 111 mmol/L (ref 101–111)
Creatinine, Ser: 1.07 mg/dL (ref 0.61–1.24)
Glucose, Bld: 91 mg/dL (ref 65–99)
Potassium: 3.8 mmol/L (ref 3.5–5.1)
Sodium: 141 mmol/L (ref 135–145)

## 2017-01-15 LAB — GLUCOSE, CAPILLARY
GLUCOSE-CAPILLARY: 139 mg/dL — AB (ref 65–99)
Glucose-Capillary: 106 mg/dL — ABNORMAL HIGH (ref 65–99)
Glucose-Capillary: 123 mg/dL — ABNORMAL HIGH (ref 65–99)

## 2017-01-15 LAB — HEMOGLOBIN A1C
HEMOGLOBIN A1C: 5.8 % — AB (ref 4.8–5.6)
MEAN PLASMA GLUCOSE: 120 mg/dL

## 2017-01-15 LAB — LACTIC ACID, PLASMA: Lactic Acid, Venous: 1.7 mmol/L (ref 0.5–1.9)

## 2017-01-15 NOTE — ED Notes (Signed)
Attempted to call nursing home to clarify the reason for the patient's visit. Unable to get a hold of nurse. Waiting for a return phone call.

## 2017-01-15 NOTE — Discharge Instructions (Signed)
You did not require oxygen while you were in the hospital and do not currently require oxygen.  You do not seem to be in any danger without using oxygen.   Discuss oxygen use with your Physician if you feel like you need it.

## 2017-01-15 NOTE — ED Provider Notes (Signed)
[facility]-EMERGENCY DEPT Provider Note   CSN: [id number] Arrival date & time: 01/15/17  2214     History   Chief Complaint Chief Complaint  Patient presents with  . wellness check    HPI Jordan Rowe is a 71 y.o. male.  Pt sent here from facility because they do not have oxygen and pt has an order for oxygen.   Pt was admitted here 5/17.  Pt has not been on oxygen.  02 stas have all been above 97.  Pt reports he has been on oxygen in the past.    The history is provided by the patient. No language interpreter was used.    Past Medical History:  Diagnosis Date  . Atrial fibrillation with RVR (HCC)    [name] 09/30/2015  . BPH (benign prostatic hypertrophy)    [name] 09/30/2015  . Constipation   . Edema   . GERD (gastroesophageal reflux disease)   . Glaucoma   . Hypertension   . Mental retardation    [name] 09/30/2015  . Schizophrenia (HCC)   . Seizure (HCC)   . Type II diabetes mellitus (HCC)    [name] 09/30/2015  . Vitamin D deficiency     Patient Active Problem List   Diagnosis Date Noted  . Acute ischemic stroke (HCC)   . Fall   . AKI (acute kidney injury) (HCC)   . Lactic acidosis   . Stroke-like symptom 01/13/2017  . NSVT (nonsustained ventricular tachycardia) (HCC) 10/05/2015  . Cardiomyopathy (HCC)   . Smoker 10/01/2015  . Schizophrenia (HCC)   . New onset a-fib (HCC) 09/30/2015  . Atrial fibrillation with RVR (HCC) 09/30/2015  . Chest pain 09/30/2015  . Diabetes mellitus type 2, controlled (HCC) 09/30/2015  . Atrial fibrillation (HCC) 09/30/2015  . Pain in the chest   . 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 Diabetes mellitus (HCC) 02/02/2013  . Dehydration 02/02/2013  . Acute renal failure (HCC) 02/02/2013  . Hypoglycemia 02/02/2013  . MENTAL RETARDATION 02/12/2009  . Essential hypertension 02/12/2009  . GERD 02/12/2009  . CONSTIPATION 02/12/2009  . CHEST PAIN 02/12/2009  . NAUSEA AND VOMITING 02/12/2009    Past Surgical History:  Procedure Laterality Date   . CATARACT EXTRACTION Left 12/2002   [name] 01/12/2011  . CATARACT EXTRACTION W/ INTRAOCULAR LENS IMPLANT Right 01/2011   [name] 02/18/2011  . MULTIPLE TOOTH EXTRACTIONS         Home Medications    Prior to Admission medications   Medication Sig Start Date End Date Taking? Authorizing Provider  acetaminophen (TYLENOL) 325 MG tablet Take 325-650 mg by mouth every 6 (six) hours as needed for mild pain or moderate pain.     [provider]  amiodarone (PACERONE) 200 MG tablet Take 1 tablet (200 mg total) by mouth daily. 10/30/15   Newman Nip, NP  apixaban (ELIQUIS) 5 MG TABS tablet Take 1 tablet (5 mg total) by mouth 2 (two) times daily. 10/07/15   Christiane Ha, MD  cholecalciferol (VITAMIN D) 1000 UNITS tablet Take 1,000 Units by mouth every morning.     [provider]  docusate sodium (COLACE) 100 MG capsule Take 100 mg by mouth 2 (two) times daily.    [provider]  finasteride (PROSCAR) 5 MG tablet Take 5 mg by mouth every evening.     [provider]  furosemide (LASIX) 20 MG tablet Take 20 mg by mouth daily.    [provider]  HYDROcodone-acetaminophen (NORCO/VICODIN) 5-325 MG tablet Take 1 tablet  by mouth every 6 (six) hours as needed. Patient not taking: Reported on 10/21/2016 01/18/16   Cheri Fowler, PA-C  insulin glargine (LANTUS) 100 UNIT/ML injection Inject 0.05 mLs (5 Units total) into the skin at bedtime. Hold is FSBS is less than 75 Patient taking differently: Inject 8 Units into the skin at bedtime. Hold is FSBS is less than 75 10/07/15   Christiane Ha, MD  latanoprost (XALATAN) 0.005 % ophthalmic solution Place 1 drop into both eyes at bedtime.    [provider]  meloxicam (MOBIC) 15 MG tablet Take 15 mg by mouth daily as needed for pain.    [provider]  omeprazole (PRILOSEC) 20 MG capsule Take 40 mg by mouth every evening. 30 min prior to evening meal.    [provider]  Polyethyl  Glycol-Propyl Glycol (SYSTANE OP) Place 1 drop into both eyes 4 (four) times daily.    [provider]  Polyvinyl Alcohol-Povidone (REFRESH OP) Place 1 drop into both eyes 2 (two) times daily.    [provider]  risperiDONE (RISPERDAL) 2 MG tablet Take 2 mg by mouth at bedtime.    [provider]  senna (SENOKOT) 8.6 MG TABS Take 1-2 tablets by mouth See admin instructions. 1 tab in am, 2 tabs in pm    [provider]  tamsulosin (FLOMAX) 0.4 MG CAPS capsule Take 1 capsule (0.4 mg total) by mouth daily. 10/22/16   Trixie Dredge, PA-C  timolol (BETIMOL) 0.5 % ophthalmic solution Place 1 drop into both eyes daily.    [provider]    Family History Family History  Problem Relation Age of Onset  . Hypertension Mother     Social History Social History  Substance Use Topics  . Smoking status: Current Every Day Smoker  . Smokeless tobacco: Never Used  . Alcohol use No     Allergies   Patient has no known allergies.   Review of Systems Review of Systems  All other systems reviewed and are negative.    Physical Exam Updated Vital Signs BP (!) 162/96   Pulse 65   Temp 98 F (36.7 C) (Oral)   Resp 18   Ht 5\' 10"  (1.778 m)   Wt 165 lb (74.8 kg)   SpO2 98%   BMI 23.68 kg/m   Physical Exam  Constitutional: He appears well-developed and well-nourished.  HENT:  Head: Normocephalic.  Eyes: Pupils are equal, round, and reactive to light.  Cardiovascular: Normal rate.   Pulmonary/Chest: Effort normal. No respiratory distress. He exhibits no tenderness.  Neurological: He is alert.  Skin: Skin is warm.  Psychiatric: He has a normal mood and affect.  Nursing note and vitals reviewed.    ED Treatments / Results  Labs (all labs ordered are listed, but only abnormal results are displayed) Labs Reviewed - No data to display  EKG  EKG Interpretation None       Radiology Ct Angio Head W Or Wo Contrast  Result Date:  01/14/2017 CLINICAL DATA:  Stroke , follow-up evaluation. History of hypertension, atrial fibrillation, diabetes. EXAM: CT ANGIOGRAPHY HEAD AND NECK TECHNIQUE: Multidetector CT imaging of the head and neck was performed using the standard protocol during bolus administration of intravenous contrast. Multiplanar CT image reconstructions and MIPs were obtained to evaluate the vascular anatomy. Carotid stenosis measurements (when applicable) are obtained utilizing NASCET criteria, using the distal internal carotid diameter as the denominator. CONTRAST:  50 cc Isovue 370 COMPARISON:  MRI of the head  Jan 13, 2017 and CT HEAD Jan 13, 2017 FINDINGS: CT HEAD FINDINGS BRAIN: No intraparenchymal hemorrhage, mass effect midline shift or acute large vascular territory infarcts. 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 Small area RIGHT frontal lobe encephalomalacia. Confluent supratentorial white matter hypodensities. Moderate ventriculomegaly on the basis of global parenchymal brain volume loss. No abnormal extra-axial fluid collections. Basal cisterns are patent. VASCULAR: Moderate to severe calcific atherosclerosis of the carotid siphons and to lesser extent LEFT vertebral artery. SKULL: No skull fracture. No significant scalp soft tissue swelling. SINUSES/ORBITS: The mastoid air-cells and included paranasal sinuses are well-aerated.The included ocular globes and orbital contents are non-suspicious. OTHER: Patient is edentulous. CTA NECK AORTIC ARCH: Normal appearance of the thoracic arch, 2 vessel arch is a normal variant. Mild calcific atherosclerosis of the aortic arch. The origins of the innominate, left Common carotid artery and subclavian artery are widely patent. RIGHT CAROTID SYSTEM: Common carotid artery is widely patent. Normal appearance of the carotid bifurcation without hemodynamically significant stenosis by NASCET criteria, mild calcific atherosclerosis. Normal appearance of the included internal carotid artery,  trace calcific atherosclerosis. LEFT CAROTID SYSTEM: Common carotid artery is widely patent. Normal appearance of the carotid bifurcation without hemodynamically significant stenosis by NASCET criteria, mild calcific atherosclerosis. Normal appearance of the included internal carotid artery, trace calcific atherosclerosis. VERTEBRAL ARTERIES:Left vertebral artery is dominant. Normal appearance of the vertebral arteries, which appear widely patent. SKELETON: No acute osseous process though bone windows have not been submitted. Severe C5-6 and C6-7 degenerative discs. Patient is edentulous. OTHER NECK: Soft tissues of the neck are non-acute though, not tailored for evaluation. Bulky nuchal ligament calcification. CTA HEAD ANTERIOR CIRCULATION: Patent cervical internal carotid arteries, petrous, cavernous and supra clinoid internal carotid arteries. Widely patent anterior communicating artery. Patent anterior and middle cerebral arteries. No large vessel occlusion, hemodynamically significant stenosis, dissection, luminal irregularity, contrast extravasation or aneurysm. POSTERIOR CIRCULATION: Patent vertebral arteries, vertebrobasilar junction and basilar artery, as well as main branch vessels. Patent posterior cerebral arteries. No large vessel occlusion, hemodynamically significant stenosis, dissection, luminal irregularity, contrast extravasation or aneurysm. VENOUS SINUSES: Major dural venous sinuses are patent though not tailored for evaluation on this angiographic examination. ANATOMIC VARIANTS: None. DELAYED PHASE: No abnormal intracranial enhancement. MIP images reviewed. IMPRESSION: CT HEAD: No acute intracranial process. Severe chronic small vessel ischemic disease. Old small RIGHT frontal lobe infarct and old RIGHT thalamus lacunar infarct. Moderate atrophy. CTA NECK: Atherosclerosis without hemodynamically significant stenosis or acute vascular process. CTA HEAD: No emergent large vessel occlusion nerves  severe stenosis. Moderate to severe calcific atherosclerosis carotid siphons and LEFT vertebral artery. Electronically Signed   By: Awilda Metro M.D.   On: 01/14/2017 21:18   Ct Angio Neck W Or Wo Contrast  Result Date: 01/14/2017 CLINICAL DATA:  Stroke , follow-up evaluation. History of hypertension, atrial fibrillation, diabetes. EXAM: CT ANGIOGRAPHY HEAD AND NECK TECHNIQUE: Multidetector CT imaging of the head and neck was performed using the standard protocol during bolus administration of intravenous contrast. Multiplanar CT image reconstructions and MIPs were obtained to evaluate the vascular anatomy. Carotid stenosis measurements (when applicable) are obtained utilizing NASCET criteria, using the distal internal carotid diameter as the denominator. CONTRAST:  50 cc Isovue 370 COMPARISON:  MRI of the head Jan 13, 2017 and CT HEAD Jan 13, 2017 FINDINGS: CT HEAD FINDINGS BRAIN: No intraparenchymal hemorrhage, mass effect midline shift or acute large vascular territory infarcts. 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 Small area RIGHT frontal lobe encephalomalacia. Confluent supratentorial white matter hypodensities. Moderate ventriculomegaly  on the basis of global parenchymal brain volume loss. No abnormal extra-axial fluid collections. Basal cisterns are patent. VASCULAR: Moderate to severe calcific atherosclerosis of the carotid siphons and to lesser extent LEFT vertebral artery. SKULL: No skull fracture. No significant scalp soft tissue swelling. SINUSES/ORBITS: The mastoid air-cells and included paranasal sinuses are well-aerated.The included ocular globes and orbital contents are non-suspicious. OTHER: Patient is edentulous. CTA NECK AORTIC ARCH: Normal appearance of the thoracic arch, 2 vessel arch is a normal variant. Mild calcific atherosclerosis of the aortic arch. The origins of the innominate, left Common carotid artery and subclavian artery are widely patent. RIGHT CAROTID SYSTEM: Common carotid  artery is widely patent. Normal appearance of the carotid bifurcation without hemodynamically significant stenosis by NASCET criteria, mild calcific atherosclerosis. Normal appearance of the included internal carotid artery, trace calcific atherosclerosis. LEFT CAROTID SYSTEM: Common carotid artery is widely patent. Normal appearance of the carotid bifurcation without hemodynamically significant stenosis by NASCET criteria, mild calcific atherosclerosis. Normal appearance of the included internal carotid artery, trace calcific atherosclerosis. VERTEBRAL ARTERIES:Left vertebral artery is dominant. Normal appearance of the vertebral arteries, which appear widely patent. SKELETON: No acute osseous process though bone windows have not been submitted. Severe C5-6 and C6-7 degenerative discs. Patient is edentulous. OTHER NECK: Soft tissues of the neck are non-acute though, not tailored for evaluation. Bulky nuchal ligament calcification. CTA HEAD ANTERIOR CIRCULATION: Patent cervical internal carotid arteries, petrous, cavernous and supra clinoid internal carotid arteries. Widely patent anterior communicating artery. Patent anterior and middle cerebral arteries. No large vessel occlusion, hemodynamically significant stenosis, dissection, luminal irregularity, contrast extravasation or aneurysm. POSTERIOR CIRCULATION: Patent vertebral arteries, vertebrobasilar junction and basilar artery, as well as main branch vessels. Patent posterior cerebral arteries. No large vessel occlusion, hemodynamically significant stenosis, dissection, luminal irregularity, contrast extravasation or aneurysm. VENOUS SINUSES: Major dural venous sinuses are patent though not tailored for evaluation on this angiographic examination. ANATOMIC VARIANTS: None. DELAYED PHASE: No abnormal intracranial enhancement. MIP images reviewed. IMPRESSION: CT HEAD: No acute intracranial process. Severe chronic small vessel ischemic disease. Old small RIGHT  frontal lobe infarct and old RIGHT thalamus lacunar infarct. Moderate atrophy. CTA NECK: Atherosclerosis without hemodynamically significant stenosis or acute vascular process. CTA HEAD: No emergent large vessel occlusion nerves severe stenosis. Moderate to severe calcific atherosclerosis carotid siphons and LEFT vertebral artery. Electronically Signed   By: Awilda Metro M.D.   On: 01/14/2017 21:18    Procedures Procedures (including critical care time)  Medications Ordered in ED Medications - No data to display   Initial Impression / Assessment and Plan / ED Course  I have reviewed the triage vital signs and the nursing notes.  Pertinent labs & imaging results that were available during my care of the patient were reviewed by me and considered in my medical decision making (see chart for details).     Pt is 97-100 % without 02.  Pt ambulates without 02.   Final Clinical Impressions(s) / ED Diagnoses   Final diagnoses:  Cerebrovascular accident (CVA), unspecified mechanism (HCC)   I spoke to discharging Physician.   Pt did not require 02 while in the hospital.     New Prescriptions New Prescriptions   No medications on file     Osie Cheeks 01/15/17 2347    Jacalyn Lefevre, MD 01/16/17 1651

## 2017-01-15 NOTE — Care Management Note (Signed)
Case Management Note  Patient Details  Name: Jordan Rowe MRN: [id number] Date of Birth: July 11, 1946  Subjective/Objective:  71 y.o. M to be discharged to ALF, [facility] today with HHPT,OT,Speech Therapy and [facility] after 2d hospital stay following Acute Septal Matter Infarcts. Made CSW, Donneta Romberg aware of his impending transfer today. Called [facility] and spoke to Med Tech to assess which agency might provide [facility] services and she stated [name] had Hospice Services. Spoke with [name] who confirmed this as true.   He is agreeable to return to [facility]F with resumption of these services                 Action/Plan:CM will sign off for now but will be available should additional discharge needs arise or disposition change.    Expected Discharge Date:  01/15/17               Expected Discharge Plan:  Assisted Living / Rest Home  In-House Referral:  Clinical Social Work  Discharge planning Services  CM Consult  Post Acute Care Choice:  Home Health, Hospice Choice offered to:  Patient  DME Arranged:  N/A DME Agency:  NA  HH Arranged:  PT, OT, Speech Therapy, Nurse's Aide HH Agency:   (Unsure of the agency providing Hospice services; spoke to the Med tech at [facility] who was able to confirm that he did have Hospice services)  Status of Service:  Completed, signed off  If discussed at [employer], dates discussed:    Additional Comments:  [name], RN 01/15/2017, 4:47 PM

## 2017-01-15 NOTE — ED Notes (Signed)
Pt currently denies any acute complaints. EMS had advised that [facility] staff stated that he was supposed to be on oxygen and that they could not provide that for the patient. In viewing paperwork sent from facility there was no order seen for oxygen. Pt currently remains 98-100% on room air.

## 2017-01-15 NOTE — ED Notes (Signed)
Bed: VF64 Expected date:  Expected time:  Means of arrival:  Comments: 68m nursing home placement

## 2017-01-15 NOTE — Progress Notes (Signed)
Physical Therapy Treatment Patient Details Name: Jordan Rowe MRN: [id number] DOB: Jul 03, 1946 Today's Date: 01/15/2017    History of Present Illness Jordan Rowe is a 71 y.o. male presenting with unsteady gait and fall . PMH is significant for intellectual disability, hypertension, HFrEF, atrial fibrillation,? Diabetes, schizophrenia, BPH and renal mass. MRI shows two subtle acute white matter infarcts in the anterior and superior right frontal lobe.    PT Comments    Pt demonstrated improved activity tolerance today with ability to ambulate ~231ft with RW. Pt continues to present with L sided weakness causing some instability and reliance on RW. Pt would benefit from Home health PT to improve gait and balance during functional activity. Continue with current plan of care.   Follow Up Recommendations  Home health PT     Equipment Recommendations  None recommended by PT    Recommendations for Other Services       Precautions / Restrictions Precautions Precautions: Fall Restrictions Weight Bearing Restrictions: No    Mobility  Bed Mobility               General bed mobility comments: Pt received in chair   Transfers Overall transfer level: Needs assistance Equipment used: Rolling walker (2 wheeled) Transfers: Sit to/from Stand Sit to Stand: Supervision         General transfer comment: Increased time for sit to stand and required supervision for safety  Ambulation/Gait Ambulation/Gait assistance: Supervision Ambulation Distance (Feet): 250 Feet Assistive device: Rolling walker (2 wheeled) Gait Pattern/deviations: Step-through pattern Gait velocity: decreased Gait velocity interpretation: Below normal speed for age/gender General Gait Details: VCs required to maintain proximity to RW and increase stride length.    Stairs            Wheelchair Mobility    Modified Rankin (Stroke Patients Only) Modified Rankin (Stroke Patients Only) Pre-Morbid  Rankin Score: No symptoms Modified Rankin: Moderately severe disability     Balance Overall balance assessment: Needs assistance Sitting-balance support: No upper extremity supported;Feet unsupported Sitting balance-Leahy Scale: Fair     Standing balance support: Bilateral upper extremity supported;During functional activity Standing balance-Leahy Scale: Poor Standing balance comment: Relies on RW for stability during sit <> stand and gait.                             Cognition Arousal/Alertness: Awake/alert Behavior During Therapy: WFL for tasks assessed/performed Overall Cognitive Status: History of cognitive impairments - at baseline                                 General Comments: Pt soft spoken at times and can be difficult to hear.       Exercises General Exercises - Lower Extremity Ankle Circles/Pumps: Strengthening;10 reps Heel Slides: Strengthening;10 reps    General Comments General comments (skin integrity, edema, etc.): PT provided BLE strengthening exercises to complete in hospital room throughout the day. Pt requests supp O2 after gait, however pt not presenting with any symptoms and O2 saturation was reading 100%      Pertinent Vitals/Pain Pain Assessment: No/denies pain    Home Living                      Prior Function            PT Goals (current goals can now be found in the care plan section) Acute  Rehab PT Goals Patient Stated Goal: to go home  PT Goal Formulation: With patient Time For Goal Achievement: 01/28/17 Potential to Achieve Goals: Good Progress towards PT goals: Progressing toward goals    Frequency    Min 3X/week      PT Plan Current plan remains appropriate    Co-evaluation              AM-PAC PT "6 Clicks" Daily Activity  Outcome Measure  Difficulty turning over in bed (including adjusting bedclothes, sheets and blankets)?: A Little Difficulty moving from lying on back to  sitting on the side of the bed? : A Lot Difficulty sitting down on and standing up from a chair with arms (e.g., wheelchair, bedside commode, etc,.)?: A Little Help needed moving to and from a bed to chair (including a wheelchair)?: A Little Help needed walking in hospital room?: A Little Help needed climbing 3-5 steps with a railing? : A Lot 6 Click Score: 16    End of Session Equipment Utilized During Treatment: Gait belt Activity Tolerance: Patient tolerated treatment well Patient left: in chair;with call bell/phone within reach;with chair alarm set Nurse Communication: Mobility status PT Visit Diagnosis: Unsteadiness on feet (R26.81);Muscle weakness (generalized) (M62.81);Hemiplegia and hemiparesis Hemiplegia - Right/Left: Left Hemiplegia - dominant/non-dominant: Non-dominant Hemiplegia - caused by: Cerebral infarction     Time: 0855-0909 PT Time Calculation (min) (ACUTE ONLY): 14 min  Charges:  $Gait Training: 8-22 mins                    G Codes:       Corlis Leak, SPT  [phone]   Corlis Leak 01/15/2017, 9:25 AM

## 2017-01-15 NOTE — Clinical Social Work Note (Signed)
Clinical Social Work Assessment  Patient Details  Name: Jordan Rowe MRN: [id number] Date of Birth: 1945/10/20  Date of referral:  01/15/17               Reason for consult:  Discharge Planning                Permission sought to share information with:  Chartered certified accountant granted to share information::  Yes, Verbal Permission Granted  Name::        Agency::  [facility]  Relationship::     Contact Information:     Housing/Transportation Living arrangements for the past 2 months:  [place] of Information:  Patient Patient Interpreter Needed:  None Criminal Activity/Legal Involvement Pertinent to Current Situation/Hospitalization:  No - Comment as needed Significant Relationships:  Other(Comment) Games developer and residents) Lives with:  Facility Resident Do you feel safe going back to the place where you live?  Yes Need for family participation in patient care:  No (Coment)  Care giving concerns:  No care giving concerns identified.    Social Worker assessment / plan:  CSW met with pt at bedside to address discharging back to [facility] today. Pt in agreement with discharge plan and denied need for CSW to call family. CSW confirmed with Olivia Mackie at [facility] that pt can return. Pt's room number is 301 and Rn would call report to [place] at [phone]. CSW faxed FL-2 and discharge summary to [phone]. RN provided report information. CSW arranged transport with [facility].  CSW signing off as no further social work needs identified.   Employment status:  Retired Forensic scientist:  Medicare PT Recommendations:  Home with [place] / Referral to community resources:  Other (Comment Required)  Patient/Family's Response to care:  Pt appreciative of CSW support.   Patient/Family's Understanding of and Emotional Response to Diagnosis, Current Treatment, and Prognosis:  Pt aware of his current treatment  plan.   Emotional Assessment Appearance:  Appears stated age Attitude/Demeanor/Rapport:  Other (Appropriate) Affect (typically observed):  Accepting, Pleasant Orientation:  Oriented to Self, Oriented to Place, Oriented to  Time, Oriented to Situation Alcohol / Substance use:  Other Psych involvement (Current and /or in the community):  No (Comment)  Discharge Needs  Concerns to be addressed:  Care Coordination Readmission within the last 30 days:  No Current discharge risk:  Chronically ill Barriers to Discharge:  Continued Medical Work up   Truitt Merle, LCSW 01/15/2017, 5:44 PM

## 2017-01-15 NOTE — ED Triage Notes (Signed)
Pt sent from [facility] due to facility having order for pt to be on oxygen but faciilty does not have oxygen for patient. EMS advised that during transport pt had no active complaint and was 98% on room air during transport. Pt denies any complaint at this time.

## 2017-01-15 NOTE — NC FL2 (Signed)
[place] MEDICAID FL2 LEVEL OF CARE SCREENING TOOL     IDENTIFICATION  Patient Name: Jordan Rowe Birthdate: 09/22/1945 Sex: male Admission Date (Current Location): 01/13/2017  [facility] and [place] Number:  Producer, television/film/video and Address:  [facility], [address], [place], [place] [address]      Provider Number: [id number]  Attending Physician Name and Address:  Moses Manners, MD  Relative Name and Phone Number:       Current Level of Care: Hospital Recommended Level of Care: Assisted Living Facility Prior Approval Number:    Date Approved/Denied:   PASRR Number:    Discharge Plan: Domiciliary (Rest home)    Current Diagnoses: Patient Active Problem List   Diagnosis Date Noted  . Acute ischemic stroke (HCC)   . Fall   . AKI (acute kidney injury) (HCC)   . Lactic acidosis   . Stroke-like symptom 01/13/2017  . NSVT (nonsustained ventricular tachycardia) (HCC) 10/05/2015  . Cardiomyopathy (HCC)   . Smoker 10/01/2015  . Schizophrenia (HCC)   . New onset a-fib (HCC) 09/30/2015  . Atrial fibrillation with RVR (HCC) 09/30/2015  . Chest pain 09/30/2015  . Diabetes mellitus type 2, controlled (HCC) 09/30/2015  . Atrial fibrillation (HCC) 09/30/2015  . Pain in the chest   . 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 Diabetes mellitus (HCC) 02/02/2013  . Dehydration 02/02/2013  . Acute renal failure (HCC) 02/02/2013  . Hypoglycemia 02/02/2013  . MENTAL RETARDATION 02/12/2009  . Essential hypertension 02/12/2009  . GERD 02/12/2009  . CONSTIPATION 02/12/2009  . CHEST PAIN 02/12/2009  . NAUSEA AND VOMITING 02/12/2009    Orientation RESPIRATION BLADDER Height & Weight     Self, Time, Situation, Place  Normal Continent Weight: 165 lb (74.8 kg) Height:  5\' 10"  (177.8 cm)  BEHAVIORAL SYMPTOMS/MOOD NEUROLOGICAL BOWEL NUTRITION STATUS      Continent Diet (Heart healthy/thin fluids)  AMBULATORY STATUS COMMUNICATION OF NEEDS Skin   Limited Assist  Verbally Normal                       Personal Care Assistance Level of Assistance  Bathing, Feeding, Dressing Bathing Assistance: Limited assistance Feeding assistance: Independent Dressing Assistance: Limited assistance     Functional Limitations Info  Sight, Hearing, Speech Sight Info: Adequate Hearing Info: Adequate Speech Info: Adequate    SPECIAL CARE FACTORS FREQUENCY  PT (By licensed PT), OT (By licensed OT)     PT Frequency: 5x OT Frequency: 5x            Contractures Contractures Info: Not present    Additional Factors Info  Code Status, Allergies, Psychotropic, Insulin Sliding Scale Code Status Info: Full Allergies Info: No known allergies Psychotropic Info: See medlist Insulin Sliding Scale Info: See medlist       Current Medications (01/15/2017):  This is the current hospital active medication list Current Facility-Administered Medications  Medication Dose Route Frequency Provider Last Rate Last Dose  . acetaminophen (TYLENOL) tablet 650 mg  650 mg Oral Q4H PRN Candelaria Stagers T, MD   650 mg at 01/14/17 0951   Or  . acetaminophen (TYLENOL) solution 650 mg  650 mg Per Tube Q4H PRN Almon Hercules, MD       Or  . acetaminophen (TYLENOL) suppository 650 mg  650 mg Rectal Q4H PRN Gonfa, Taye T, MD      . amiodarone (PACERONE) tablet 200 mg  200 mg Oral Daily Almon Hercules, MD  200 mg at 01/15/17 1204  . apixaban (ELIQUIS) tablet 5 mg  5 mg Oral BID Berton Bon, MD   5 mg at 01/15/17 1158  . cholecalciferol (VITAMIN D) tablet 1,000 Units  1,000 Units Oral q morning - 10a Almon Hercules, MD   1,000 Units at 01/15/17 1203  . insulin aspart (novoLOG) injection 0-9 Units  0-9 Units Subcutaneous TID WC Almon Hercules, MD   1 Units at 01/15/17 1359  . latanoprost (XALATAN) 0.005 % ophthalmic solution 1 drop  1 drop Both Eyes QHS Almon Hercules, MD   1 drop at 01/14/17 2154  . risperiDONE (RISPERDAL) tablet 2 mg  2 mg Oral QHS Candelaria Stagers T, MD   2 mg at  01/14/17 2153  . senna-docusate (Senokot-S) tablet 1 tablet  1 tablet Oral QHS PRN Almon Hercules, MD   1 tablet at 01/13/17 2147  . tamsulosin (FLOMAX) capsule 0.4 mg  0.4 mg Oral Daily Candelaria Stagers T, MD   0.4 mg at 01/15/17 1204  . timolol (TIMOPTIC) 0.5 % ophthalmic solution 1 drop  1 drop Both Eyes Daily Hensel, Santiago Bumpers, MD   1 drop at 01/15/17 1157     Discharge Medications: Please see discharge summary for a list of discharge medications.  Relevant Imaging Results:  Relevant Lab Results:   Additional Information SSN: [id number]  Dominic Pea, LCSW

## 2017-01-15 NOTE — Discharge Summary (Signed)
Family Medicine Teaching [facility] Discharge Summary  Patient name: Jordan Rowe Medical record number: [id number] Date of birth: [birth year] Age: 71 y.o. Gender: male Date of Admission: 01/13/2017  Date of Discharge: 01/15/17 Admitting Physician: Moses Manners, MD  Primary Care Provider: System, Pcp Not In Consultants: Neurology  Indication for Hospitalization: CVA  Discharge Diagnoses/Problem List:  CVA Intellectual Disability HTN HFrEF AFib Schizophrenia  T2DM Chronic Pain  History of Seizure Disorder  BPH  Disposition: Return to ALF  Discharge Condition: Home   Discharge Exam:  GEN: appears frail, NAD.  Head: NCAT  Eyes: EOMI, PERRL CVS: RRR no MRG  RESP: CTAB, no increased WOB  GI: soft, NTND, +bs MSK: no tenderness or edema present  SKIN: warm, dry  NEURO: AAO x4, CN II-XII grossly intact.  No facial droop or slurring of speech.  Motor strength 5/5 in upper extremities bilaterally, 4/5 in lower extremities bilaterally. Sensation intact.   Brief Hospital Course:  Jordan Rowe is 71 y.o. male with PMH of intellectual disability, schizophrenia, HTN, chronic pain and Afib who presented from ALF after fall and with unsteady gait. He was found to have 2 septal acute white matter infarcts in anterior and superior right frontal lobe on MRI brain. He was admitted for CVA and a stroke work up was completed. All other imaging was unremarkable. Due to lack of significant atherosclerosis, neurology did not feel addition of anti-platelet medication was warranted. Due to LDL <70, statin therapy was also not indicated.   Issues for Follow Up:  1. Tramadol was discontinued due to history of seizures, as this may lower seizure threshold.  2.    A 5 x 2.6 x 3 cm hypoechoic area within the mid central left kidney noted on his renal ultrasound in 01/2013. Recommended elective MRI at that time. 3. Anti-hypertensives held at discharge due to permissive HTN. These should be  re-started as needed for BP control.   Significant Procedures:  None   Significant Labs and Imaging:   Recent Labs Lab 01/13/17 1214 01/15/17 0330  WBC 6.0 6.5  HGB 13.5 12.3*  HCT 39.4 37.7*  PLT 209 200    Recent Labs Lab 01/13/17 1214 01/14/17 0400 01/15/17 0330  NA 138 141 141  K 4.1 3.5 3.8  CL 103 108 111  CO2 25 24 22   GLUCOSE 118* 72 91  BUN 9 8 7   CREATININE 1.38* 1.08 1.07  CALCIUM 9.1 8.5* 8.5*  ALKPHOS 44  --   --   AST 29  --   --   ALT 15*  --   --   ALBUMIN 3.5  --   --     Ct Angio Head W Or Wo Contrast  Result Date: 01/14/2017 CLINICAL DATA:  Stroke , follow-up evaluation. History of hypertension, atrial fibrillation, diabetes. EXAM: CT ANGIOGRAPHY HEAD AND NECK TECHNIQUE: Multidetector CT imaging of the head and neck was performed using the standard protocol during bolus administration of intravenous contrast. Multiplanar CT image reconstructions and MIPs were obtained to evaluate the vascular anatomy. Carotid stenosis measurements (when applicable) are obtained utilizing NASCET criteria, using the distal internal carotid diameter as the denominator. CONTRAST:  50 cc Isovue 370 COMPARISON:  MRI of the head Jan 13, 2017 and CT HEAD Jan 13, 2017 FINDINGS: CT HEAD FINDINGS BRAIN: No intraparenchymal hemorrhage, mass effect midline shift or acute large vascular territory infarcts. 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 Small area RIGHT frontal lobe encephalomalacia. Confluent supratentorial white matter hypodensities. Moderate ventriculomegaly on the  basis of global parenchymal brain volume loss. No abnormal extra-axial fluid collections. Basal cisterns are patent. VASCULAR: Moderate to severe calcific atherosclerosis of the carotid siphons and to lesser extent LEFT vertebral artery. SKULL: No skull fracture. No significant scalp soft tissue swelling. SINUSES/ORBITS: The mastoid air-cells and included paranasal sinuses are well-aerated.The included ocular globes and  orbital contents are non-suspicious. OTHER: Patient is edentulous. CTA NECK AORTIC ARCH: Normal appearance of the thoracic arch, 2 vessel arch is a normal variant. Mild calcific atherosclerosis of the aortic arch. The origins of the innominate, left Common carotid artery and subclavian artery are widely patent. RIGHT CAROTID SYSTEM: Common carotid artery is widely patent. Normal appearance of the carotid bifurcation without hemodynamically significant stenosis by NASCET criteria, mild calcific atherosclerosis. Normal appearance of the included internal carotid artery, trace calcific atherosclerosis. LEFT CAROTID SYSTEM: Common carotid artery is widely patent. Normal appearance of the carotid bifurcation without hemodynamically significant stenosis by NASCET criteria, mild calcific atherosclerosis. Normal appearance of the included internal carotid artery, trace calcific atherosclerosis. VERTEBRAL ARTERIES:Left vertebral artery is dominant. Normal appearance of the vertebral arteries, which appear widely patent. SKELETON: No acute osseous process though bone windows have not been submitted. Severe C5-6 and C6-7 degenerative discs. Patient is edentulous. OTHER NECK: Soft tissues of the neck are non-acute though, not tailored for evaluation. Bulky nuchal ligament calcification. CTA HEAD ANTERIOR CIRCULATION: Patent cervical internal carotid arteries, petrous, cavernous and supra clinoid internal carotid arteries. Widely patent anterior communicating artery. Patent anterior and middle cerebral arteries. No large vessel occlusion, hemodynamically significant stenosis, dissection, luminal irregularity, contrast extravasation or aneurysm. POSTERIOR CIRCULATION: Patent vertebral arteries, vertebrobasilar junction and basilar artery, as well as main branch vessels. Patent posterior cerebral arteries. No large vessel occlusion, hemodynamically significant stenosis, dissection, luminal irregularity, contrast extravasation or  aneurysm. VENOUS SINUSES: Major dural venous sinuses are patent though not tailored for evaluation on this angiographic examination. ANATOMIC VARIANTS: None. DELAYED PHASE: No abnormal intracranial enhancement. MIP images reviewed. IMPRESSION: CT HEAD: No acute intracranial process. Severe chronic small vessel ischemic disease. Old small RIGHT frontal lobe infarct and old RIGHT thalamus lacunar infarct. Moderate atrophy. CTA NECK: Atherosclerosis without hemodynamically significant stenosis or acute vascular process. CTA HEAD: No emergent large vessel occlusion nerves severe stenosis. Moderate to severe calcific atherosclerosis carotid siphons and LEFT vertebral artery. Electronically Signed   By: Awilda Metro M.D.   On: 01/14/2017 21:18   Ct Head Wo Contrast  Result Date: 01/13/2017 CLINICAL DATA:  Status post fall at the nursing facility EXAM: CT HEAD WITHOUT CONTRAST CT CERVICAL SPINE WITHOUT CONTRAST TECHNIQUE: Multidetector CT imaging of the head and cervical spine was performed following the standard protocol without intravenous contrast. Multiplanar CT image reconstructions of the cervical spine were also generated. COMPARISON:  02/01/2017 FINDINGS: CT HEAD FINDINGS Brain: No evidence of acute infarction, hemorrhage, hydrocephalus, extra-axial collection or mass lesion/mass effect. Prominence of the sulci and ventricles identified compatible with brain atrophy. There is marked low attenuation within the subcortical and periventricular white matter compatible with advanced chronic small vessel ischemic disease. Vascular: No hyperdense vessel or unexpected calcification. Skull: Normal. Negative for fracture or focal lesion. Sinuses/Orbits: No acute finding. Other: None. CT CERVICAL SPINE FINDINGS Alignment: The alignment of the cervical spine appears normal. The vertebral body heights are well preserved. The facet joints are well-aligned. The prevertebral soft tissue space appears normal. Skull base  and vertebrae: No acute fracture. No primary bone lesion or focal pathologic process. Soft tissues and spinal canal: No prevertebral fluid  or swelling. No visible canal hematoma. Disc levels: Multi level disc space narrowing and ventral endplate spurring noted peer most advanced at C5-6 and C6-7. Upper chest: Negative. Other: None IMPRESSION: 1. No acute intracranial abnormality. 2. Chronic small vessel ischemic disease and brain atrophy 3. Cervical spondylosis.  No cervical spine fracture noted. Electronically Signed   By: Signa Kell M.D.   On: 01/13/2017 13:58   Ct Angio Neck W Or Wo Contrast  Result Date: 01/14/2017 CLINICAL DATA:  Stroke , follow-up evaluation. History of hypertension, atrial fibrillation, diabetes. EXAM: CT ANGIOGRAPHY HEAD AND NECK TECHNIQUE: Multidetector CT imaging of the head and neck was performed using the standard protocol during bolus administration of intravenous contrast. Multiplanar CT image reconstructions and MIPs were obtained to evaluate the vascular anatomy. Carotid stenosis measurements (when applicable) are obtained utilizing NASCET criteria, using the distal internal carotid diameter as the denominator. CONTRAST:  50 cc Isovue 370 COMPARISON:  MRI of the head Jan 13, 2017 and CT HEAD Jan 13, 2017 FINDINGS: CT HEAD FINDINGS BRAIN: No intraparenchymal hemorrhage, mass effect midline shift or acute large vascular territory infarcts. 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 Small area RIGHT frontal lobe encephalomalacia. Confluent supratentorial white matter hypodensities. Moderate ventriculomegaly on the basis of global parenchymal brain volume loss. No abnormal extra-axial fluid collections. Basal cisterns are patent. VASCULAR: Moderate to severe calcific atherosclerosis of the carotid siphons and to lesser extent LEFT vertebral artery. SKULL: No skull fracture. No significant scalp soft tissue swelling. SINUSES/ORBITS: The mastoid air-cells and included paranasal sinuses  are well-aerated.The included ocular globes and orbital contents are non-suspicious. OTHER: Patient is edentulous. CTA NECK AORTIC ARCH: Normal appearance of the thoracic arch, 2 vessel arch is a normal variant. Mild calcific atherosclerosis of the aortic arch. The origins of the innominate, left Common carotid artery and subclavian artery are widely patent. RIGHT CAROTID SYSTEM: Common carotid artery is widely patent. Normal appearance of the carotid bifurcation without hemodynamically significant stenosis by NASCET criteria, mild calcific atherosclerosis. Normal appearance of the included internal carotid artery, trace calcific atherosclerosis. LEFT CAROTID SYSTEM: Common carotid artery is widely patent. Normal appearance of the carotid bifurcation without hemodynamically significant stenosis by NASCET criteria, mild calcific atherosclerosis. Normal appearance of the included internal carotid artery, trace calcific atherosclerosis. VERTEBRAL ARTERIES:Left vertebral artery is dominant. Normal appearance of the vertebral arteries, which appear widely patent. SKELETON: No acute osseous process though bone windows have not been submitted. Severe C5-6 and C6-7 degenerative discs. Patient is edentulous. OTHER NECK: Soft tissues of the neck are non-acute though, not tailored for evaluation. Bulky nuchal ligament calcification. CTA HEAD ANTERIOR CIRCULATION: Patent cervical internal carotid arteries, petrous, cavernous and supra clinoid internal carotid arteries. Widely patent anterior communicating artery. Patent anterior and middle cerebral arteries. No large vessel occlusion, hemodynamically significant stenosis, dissection, luminal irregularity, contrast extravasation or aneurysm. POSTERIOR CIRCULATION: Patent vertebral arteries, vertebrobasilar junction and basilar artery, as well as main branch vessels. Patent posterior cerebral arteries. No large vessel occlusion, hemodynamically significant stenosis, dissection,  luminal irregularity, contrast extravasation or aneurysm. VENOUS SINUSES: Major dural venous sinuses are patent though not tailored for evaluation on this angiographic examination. ANATOMIC VARIANTS: None. DELAYED PHASE: No abnormal intracranial enhancement. MIP images reviewed. IMPRESSION: CT HEAD: No acute intracranial process. Severe chronic small vessel ischemic disease. Old small RIGHT frontal lobe infarct and old RIGHT thalamus lacunar infarct. Moderate atrophy. CTA NECK: Atherosclerosis without hemodynamically significant stenosis or acute vascular process. CTA HEAD: No emergent large vessel occlusion nerves severe stenosis. Moderate to  severe calcific atherosclerosis carotid siphons and LEFT vertebral artery. Electronically Signed   By: Awilda Metro M.D.   On: 01/14/2017 21:18   Ct Cervical Spine Wo Contrast  Result Date: 01/13/2017 CLINICAL DATA:  Status post fall at the nursing facility EXAM: CT HEAD WITHOUT CONTRAST CT CERVICAL SPINE WITHOUT CONTRAST TECHNIQUE: Multidetector CT imaging of the head and cervical spine was performed following the standard protocol without intravenous contrast. Multiplanar CT image reconstructions of the cervical spine were also generated. COMPARISON:  02/01/2017 FINDINGS: CT HEAD FINDINGS Brain: No evidence of acute infarction, hemorrhage, hydrocephalus, extra-axial collection or mass lesion/mass effect. Prominence of the sulci and ventricles identified compatible with brain atrophy. There is marked low attenuation within the subcortical and periventricular white matter compatible with advanced chronic small vessel ischemic disease. Vascular: No hyperdense vessel or unexpected calcification. Skull: Normal. Negative for fracture or focal lesion. Sinuses/Orbits: No acute finding. Other: None. CT CERVICAL SPINE FINDINGS Alignment: The alignment of the cervical spine appears normal. The vertebral body heights are well preserved. The facet joints are well-aligned. The  prevertebral soft tissue space appears normal. Skull base and vertebrae: No acute fracture. No primary bone lesion or focal pathologic process. Soft tissues and spinal canal: No prevertebral fluid or swelling. No visible canal hematoma. Disc levels: Multi level disc space narrowing and ventral endplate spurring noted peer most advanced at C5-6 and C6-7. Upper chest: Negative. Other: None IMPRESSION: 1. No acute intracranial abnormality. 2. Chronic small vessel ischemic disease and brain atrophy 3. Cervical spondylosis.  No cervical spine fracture noted. Electronically Signed   By: Signa Kell M.D.   On: 01/13/2017 13:58   Mr Brain Wo Contrast (neuro Protocol)  Result Date: 01/13/2017 CLINICAL DATA:  71 year old male found down at nursing facility. Left pronator drift. EXAM: MRI HEAD WITHOUT CONTRAST TECHNIQUE: Multiplanar, multiecho pulse sequences of the brain and surrounding structures were obtained without intravenous contrast. COMPARISON:  Head and cervical spine CT 1304 hours today. FINDINGS: Brain: There is a small gyral or subcortical white matter 5-6 mm focus of restricted diffusion along the anterior right frontal lobe on series 3, image 37. There is a questionable small additional right superior frontal gyrus white matter infarct on image 43. No other convincing restricted diffusion. Superimposed right anterior superior frontal gyrus cortical encephalomalacia and confluent bilateral cerebral white matter T2 and FLAIR hyperintensity (series 4, image 22). There is a punctate microhemorrhage in this region on series 5, image 19, but no malignant or definitely acute intracranial hemorrhage associated. No intracranial mass effect. In addition to the confluent bilateral cerebral white matter signal changes there is a prominent chronic lacunar infarct in the central right thalamus. Left deep gray matter nuclei, the brainstem, and cerebellum are negative. No ventriculomegaly. Cervicomedullary junction and  pituitary are within normal limits. Vascular: Major intracranial vascular flow voids are preserved. There is mild generalized intracranial artery ectasia. Skull and upper cervical spine: Negative. Sinuses/Orbits: Negative. Visualized paranasal sinuses and mastoids are stable and well pneumatized. Other: Visible internal auditory structures appear normal. Negative scalp soft tissues. IMPRESSION: 1. Two subtle acute white matter infarcts in the anterior and superior right frontal lobe, superimposed on chronic encephalomalacia. No associated hemorrhage or mass effect. 2. Additional bilateral confluent cerebral white matter disease favored due to chronic small vessel ischemia. Chronic lacunar infarct in the right thalamus. Electronically Signed   By: Odessa Fleming M.D.   On: 01/13/2017 17:28   Dg Chest Port 1 View  Result Date: 01/13/2017 CLINICAL DATA:  Hypotensive.  EXAM: PORTABLE CHEST 1 VIEW COMPARISON:  Radiographs of September 30, 2015. FINDINGS: The heart size and mediastinal contours are within normal limits. Both lungs are clear. Atherosclerosis of thoracic aorta is noted. No pneumothorax or pleural effusion is noted. Old right rib fractures are noted. IMPRESSION: No acute cardiopulmonary abnormality seen.  Old right rib fractures. Electronically Signed   By: Lupita Raider, M.D.   On: 01/13/2017 13:36    Results/Tests Pending at Time of Discharge: None   Discharge Medications:  Allergies as of 01/15/2017   No Known Allergies     Medication List    STOP taking these medications   carvedilol 25 MG tablet Commonly known as:  COREG   hydrALAZINE 25 MG tablet Commonly known as:  APRESOLINE   isosorbide mononitrate 20 MG tablet Commonly known as:  ISMO,MONOKET   lisinopril 20 MG tablet Commonly known as:  PRINIVIL,ZESTRIL   spironolactone 25 MG tablet Commonly known as:  ALDACTONE   traMADol 50 MG tablet Commonly known as:  ULTRAM     TAKE these medications   acetaminophen 325 MG  tablet Commonly known as:  TYLENOL Take 325-650 mg by mouth every 6 (six) hours as needed for mild pain or moderate pain.   amiodarone 200 MG tablet Commonly known as:  PACERONE Take 1 tablet (200 mg total) by mouth daily.   apixaban 5 MG Tabs tablet Commonly known as:  ELIQUIS Take 1 tablet (5 mg total) by mouth 2 (two) times daily.   cholecalciferol 1000 units tablet Commonly known as:  VITAMIN D Take 1,000 Units by mouth every morning.   docusate sodium 100 MG capsule Commonly known as:  COLACE Take 100 mg by mouth 2 (two) times daily.   finasteride 5 MG tablet Commonly known as:  PROSCAR Take 5 mg by mouth every evening.   furosemide 20 MG tablet Commonly known as:  LASIX Take 20 mg by mouth daily.   HYDROcodone-acetaminophen 5-325 MG tablet Commonly known as:  NORCO/VICODIN Take 1 tablet by mouth every 6 (six) hours as needed.   insulin glargine 100 UNIT/ML injection Commonly known as:  LANTUS Inject 0.05 mLs (5 Units total) into the skin at bedtime. Hold is FSBS is less than 75 What changed:  how much to take  additional instructions   latanoprost 0.005 % ophthalmic solution Commonly known as:  XALATAN Place 1 drop into both eyes at bedtime.   meloxicam 15 MG tablet Commonly known as:  MOBIC Take 15 mg by mouth daily as needed for pain.   omeprazole 20 MG capsule Commonly known as:  PRILOSEC Take 40 mg by mouth every evening. 30 min prior to evening meal.   REFRESH OP Place 1 drop into both eyes 2 (two) times daily.   risperiDONE 2 MG tablet Commonly known as:  RISPERDAL Take 2 mg by mouth at bedtime.   senna 8.6 MG Tabs tablet Commonly known as:  SENOKOT Take 1-2 tablets by mouth See admin instructions. 1 tab in am, 2 tabs in pm   SYSTANE OP Place 1 drop into both eyes 4 (four) times daily.   tamsulosin 0.4 MG Caps capsule Commonly known as:  FLOMAX Take 1 capsule (0.4 mg total) by mouth daily.   timolol 0.5 % ophthalmic solution Commonly  known as:  BETIMOL Place 1 drop into both eyes daily.       Discharge Instructions: Please refer to Patient Instructions section of EMR for full details.  Patient was counseled important signs and symptoms  that should prompt return to medical care, changes in medications, dietary instructions, activity restrictions, and follow up appointments.   Follow-Up Appointments:   Arvilla Market, DO 01/15/2017, 4:12 PM PGY-2, [facility]

## 2017-01-15 NOTE — Progress Notes (Signed)
Family Medicine Teaching Service Daily Progress Note Intern Pager: [phone]  Patient name: Jordan Rowe Medical record number: [id number] Date of birth: 08/26/1946 Age: 71 y.o. Gender: male  Primary Care Provider: System, Pcp Not In Consultants: Neurology Code Status: FULL  Pt Overview and Major Events to Date:  Admit 5/17  Assessment and Plan: Jordan Rowe is a 71 y.o. male presenting with unsteady gait and fall . PMH is significant for intellectual disability, hypertension, HFrEF, atrial fibrillation,? Diabetes, schizophrenia, BPH and renal mass.    CVA: Patient with "unsteady gait" and fall from bed. Mechanism of fall is unknown. Neuro exam symmetric with no notable deficit except for BLE strength to 4/5. He is AAO 4. However, he was noted to have dysmetria with his left finger to nose and left pronator drift in ED. CT head and cervical spine without acute finding. MRI brain with 2 septal acute white matter infarcts in anterior and superior right frontal lobe and other chronic changes. Neurology was consulted in ED. He is already outside tPA window. Others on differential for his unsteady gait/fall are iatrogenic (patient on multiple antihypertensive and sedative medications), infection, seizure, arrhythmia, dehydration or mechanical fall. Patient has low BPs to 80s/60s and lactic acidosis to 3.52 on arrival to ED. In the absence of signs of infection or leukocytosis, hypotension and lactic acidosis is likely due to dehydration in patient with mental retardation living in facility but cannot rule out infectious process. Surprisingly no ketones in his urine.  UA is negative for UTI. Patient is on tramadol which could lower his seizure threshold if he happens to her history of seizure as listed in his past medical history. EKG with bradycardia and ectopic atrial rhythm. Initial troponin negative and subsequent trops neg.  APTT wnl, PT 18.1, INR wnl.  Lipid panel normal.   -Neurology consulted,  appreciate recs             -Carotid dopplers pending  - Echo pending  -Risk stratification labs- A1c and TSH pending  -Continue home Eliquis.  -Neurovascular check q2hrs for 12 hours then q4 hours  -Cardiac monitor -Permissive hypertension -Atorvastatin -PT/OT/SLP recs -Fall and seizure precaution -SW consult (if from facility) - CIR consult  Lactic acidosis: Resolved. Lactic acid elevated to 3.52. He was initially hypotensive to 80s over 60s in ED that as resolved with fluid resuscitation. He has no other signs of infection. UA negative. No leukocytosis. CXR without acute abnormality.  -hold home antihypertensive medications  Hypertension: Initially hypotensive to 80s/60s in ED. Hypotension resolved with fluid resuscitation.  Normotensive this AM.   -Monitor BPs  Atrial fibrillation: [place] Vascscore 7. He is on amiodarone, Coreg and Eliquis at home.  No history of DVT or PE per chart review.   -Continue home amiodarone  -Hold Coreg and Eliquis pending neuro recs   Bradycardia: Heart rate in 50s. EKG with ectopic atrial rhythm.   -Hold nodal blocking agents  HFrEF/CM: echo in 09/2016 with EF 25-30%, diffuse hypokinesis and G2DD. On Coreg, lisinopril, spironolactone, Lasix and BiDil at home. No respiratory symptoms or signs of fluid overload.  -Hold home meds pending neuro recs -Echocardiogram pending  Diabetes: A1c 5.8. On Lantus 5 units at bedtime  -SSI-thin -CBG before meals and at bedtime  Schizophrenia/intellectual disability/insomnia: On risperidone 2 mg at home  -Continue home risperidone 2 mg at bedtime   History of Seizure:  doesn't appear to be on any medication. Tramadol on his medication list which could lower his seizure threshold. -Discontinue tramadol  AKI: Serum creatinine elevated 1.38 on arrival. Improving to 1.08 this AM.  Baseline 1.02. Likely due to dehydration. -Daily BMET  -We will avoid nephrotoxic drugs  Renal  mass: a 5 x 2.6 x 3 cm hypoechoic area within the mid central left kidney noted on his renal ultrasound in 01/2013. Recommended elective MRI at that time.  Tobacco use: former smoker   BPH: Finasteride and tamsulosin on his medication list. Wears depends  -Bladder scan  -Continue tamsulosin   Chronic pain: Norco, tramadol and meloxicam on his medication list. -Discontinue tramadol -Resume Norco if needed  Constipation: stated that he hasn't had a bowel movement in a while. -MiraLAX once and then daily when necessary   FEN/GI:  -Modified and heart healthy diet  -NS 75 mL/h  -Miralax as above   Disposition: Dispo pending clinical improvement.   Subjective:  Patient awake and oriented this AM.  No concerns overnight.  Speech is normal but slow and no slurring noted.  Denies chest pain, shortness of breath.    Objective: Temp:  [98 F (36.7 C)-98.6 F (37 C)] 98.2 F (36.8 C) (05/19 0036) Pulse Rate:  [51-70] 51 (05/19 0036) Resp:  [18] 18 (05/19 0036) BP: (130-161)/(66-87) 147/66 (05/19 0036) SpO2:  [96 %-100 %] 100 % (05/19 0036)   Physical Exam: GEN: appears frail, NAD.  Head: NCAT  Eyes: EOMI, PERRL CVS: RRR no MRG  RESP: CTAB, no increased WOB  GI: soft, NTND, +bs MSK: no tenderness or edema present  SKIN: warm, dry  NEURO: AAO x4, CN II-XII grossly intact.  No facial droop or slurring of speech.  Motor strength 5/5 in upper extremities bilaterally, 4/5 in lower extremities bilaterally. Sensation intact.   Laboratory:  Recent Labs Lab 01/13/17 1214 01/15/17 0330  WBC 6.0 6.5  HGB 13.5 12.3*  HCT 39.4 37.7*  PLT 209 200    Recent Labs Lab 01/13/17 1214 01/14/17 0400 01/15/17 0330  NA 138 141 141  K 4.1 3.5 3.8  CL 103 108 111  CO2 25 24 22   BUN 9 8 7   CREATININE 1.38* 1.08 1.07  CALCIUM 9.1 8.5* 8.5*  PROT 5.8*  --   --   BILITOT 0.6  --   --   ALKPHOS 44  --   --   ALT 15*  --   --   AST 29  --   --   GLUCOSE 118* 72 91    Imaging/Diagnostic Tests: Ct Angio Head W Or Wo Contrast  Result Date: 01/14/2017 CLINICAL DATA:  Stroke , follow-up evaluation. History of hypertension, atrial fibrillation, diabetes. EXAM: CT ANGIOGRAPHY HEAD AND NECK TECHNIQUE: Multidetector CT imaging of the head and neck was performed using the standard protocol during bolus administration of intravenous contrast. Multiplanar CT image reconstructions and MIPs were obtained to evaluate the vascular anatomy. Carotid stenosis measurements (when applicable) are obtained utilizing NASCET criteria, using the distal internal carotid diameter as the denominator. CONTRAST:  50 cc Isovue 370 COMPARISON:  MRI of the head Jan 13, 2017 and CT HEAD Jan 13, 2017 FINDINGS: CT HEAD FINDINGS BRAIN: No intraparenchymal hemorrhage, mass effect midline shift or acute large vascular territory infarcts. 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 Small area RIGHT frontal lobe encephalomalacia. Confluent supratentorial white matter hypodensities. Moderate ventriculomegaly on the basis of global parenchymal brain volume loss. No abnormal extra-axial fluid collections. Basal cisterns are patent. VASCULAR: Moderate to severe calcific atherosclerosis of the carotid siphons and to lesser extent LEFT vertebral artery. SKULL: No skull fracture.  No significant scalp soft tissue swelling. SINUSES/ORBITS: The mastoid air-cells and included paranasal sinuses are well-aerated.The included ocular globes and orbital contents are non-suspicious. OTHER: Patient is edentulous. CTA NECK AORTIC ARCH: Normal appearance of the thoracic arch, 2 vessel arch is a normal variant. Mild calcific atherosclerosis of the aortic arch. The origins of the innominate, left Common carotid artery and subclavian artery are widely patent. RIGHT CAROTID SYSTEM: Common carotid artery is widely patent. Normal appearance of the carotid bifurcation without hemodynamically significant stenosis by NASCET criteria, mild calcific  atherosclerosis. Normal appearance of the included internal carotid artery, trace calcific atherosclerosis. LEFT CAROTID SYSTEM: Common carotid artery is widely patent. Normal appearance of the carotid bifurcation without hemodynamically significant stenosis by NASCET criteria, mild calcific atherosclerosis. Normal appearance of the included internal carotid artery, trace calcific atherosclerosis. VERTEBRAL ARTERIES:Left vertebral artery is dominant. Normal appearance of the vertebral arteries, which appear widely patent. SKELETON: No acute osseous process though bone windows have not been submitted. Severe C5-6 and C6-7 degenerative discs. Patient is edentulous. OTHER NECK: Soft tissues of the neck are non-acute though, not tailored for evaluation. Bulky nuchal ligament calcification. CTA HEAD ANTERIOR CIRCULATION: Patent cervical internal carotid arteries, petrous, cavernous and supra clinoid internal carotid arteries. Widely patent anterior communicating artery. Patent anterior and middle cerebral arteries. No large vessel occlusion, hemodynamically significant stenosis, dissection, luminal irregularity, contrast extravasation or aneurysm. POSTERIOR CIRCULATION: Patent vertebral arteries, vertebrobasilar junction and basilar artery, as well as main branch vessels. Patent posterior cerebral arteries. No large vessel occlusion, hemodynamically significant stenosis, dissection, luminal irregularity, contrast extravasation or aneurysm. VENOUS SINUSES: Major dural venous sinuses are patent though not tailored for evaluation on this angiographic examination. ANATOMIC VARIANTS: None. DELAYED PHASE: No abnormal intracranial enhancement. MIP images reviewed. IMPRESSION: CT HEAD: No acute intracranial process. Severe chronic small vessel ischemic disease. Old small RIGHT frontal lobe infarct and old RIGHT thalamus lacunar infarct. Moderate atrophy. CTA NECK: Atherosclerosis without hemodynamically significant stenosis or  acute vascular process. CTA HEAD: No emergent large vessel occlusion nerves severe stenosis. Moderate to severe calcific atherosclerosis carotid siphons and LEFT vertebral artery. Electronically Signed   By: Awilda Metro M.D.   On: 01/14/2017 21:18   Ct Angio Neck W Or Wo Contrast  Result Date: 01/14/2017 CLINICAL DATA:  Stroke , follow-up evaluation. History of hypertension, atrial fibrillation, diabetes. EXAM: CT ANGIOGRAPHY HEAD AND NECK TECHNIQUE: Multidetector CT imaging of the head and neck was performed using the standard protocol during bolus administration of intravenous contrast. Multiplanar CT image reconstructions and MIPs were obtained to evaluate the vascular anatomy. Carotid stenosis measurements (when applicable) are obtained utilizing NASCET criteria, using the distal internal carotid diameter as the denominator. CONTRAST:  50 cc Isovue 370 COMPARISON:  MRI of the head Jan 13, 2017 and CT HEAD Jan 13, 2017 FINDINGS: CT HEAD FINDINGS BRAIN: No intraparenchymal hemorrhage, mass effect midline shift or acute large vascular territory infarcts. 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 Small area RIGHT frontal lobe encephalomalacia. Confluent supratentorial white matter hypodensities. Moderate ventriculomegaly on the basis of global parenchymal brain volume loss. No abnormal extra-axial fluid collections. Basal cisterns are patent. VASCULAR: Moderate to severe calcific atherosclerosis of the carotid siphons and to lesser extent LEFT vertebral artery. SKULL: No skull fracture. No significant scalp soft tissue swelling. SINUSES/ORBITS: The mastoid air-cells and included paranasal sinuses are well-aerated.The included ocular globes and orbital contents are non-suspicious. OTHER: Patient is edentulous. CTA NECK AORTIC ARCH: Normal appearance of the thoracic arch, 2 vessel arch is a normal variant.  Mild calcific atherosclerosis of the aortic arch. The origins of the innominate, left Common carotid artery  and subclavian artery are widely patent. RIGHT CAROTID SYSTEM: Common carotid artery is widely patent. Normal appearance of the carotid bifurcation without hemodynamically significant stenosis by NASCET criteria, mild calcific atherosclerosis. Normal appearance of the included internal carotid artery, trace calcific atherosclerosis. LEFT CAROTID SYSTEM: Common carotid artery is widely patent. Normal appearance of the carotid bifurcation without hemodynamically significant stenosis by NASCET criteria, mild calcific atherosclerosis. Normal appearance of the included internal carotid artery, trace calcific atherosclerosis. VERTEBRAL ARTERIES:Left vertebral artery is dominant. Normal appearance of the vertebral arteries, which appear widely patent. SKELETON: No acute osseous process though bone windows have not been submitted. Severe C5-6 and C6-7 degenerative discs. Patient is edentulous. OTHER NECK: Soft tissues of the neck are non-acute though, not tailored for evaluation. Bulky nuchal ligament calcification. CTA HEAD ANTERIOR CIRCULATION: Patent cervical internal carotid arteries, petrous, cavernous and supra clinoid internal carotid arteries. Widely patent anterior communicating artery. Patent anterior and middle cerebral arteries. No large vessel occlusion, hemodynamically significant stenosis, dissection, luminal irregularity, contrast extravasation or aneurysm. POSTERIOR CIRCULATION: Patent vertebral arteries, vertebrobasilar junction and basilar artery, as well as main branch vessels. Patent posterior cerebral arteries. No large vessel occlusion, hemodynamically significant stenosis, dissection, luminal irregularity, contrast extravasation or aneurysm. VENOUS SINUSES: Major dural venous sinuses are patent though not tailored for evaluation on this angiographic examination. ANATOMIC VARIANTS: None. DELAYED PHASE: No abnormal intracranial enhancement. MIP images reviewed. IMPRESSION: CT HEAD: No acute  intracranial process. Severe chronic small vessel ischemic disease. Old small RIGHT frontal lobe infarct and old RIGHT thalamus lacunar infarct. Moderate atrophy. CTA NECK: Atherosclerosis without hemodynamically significant stenosis or acute vascular process. CTA HEAD: No emergent large vessel occlusion nerves severe stenosis. Moderate to severe calcific atherosclerosis carotid siphons and LEFT vertebral artery. Electronically Signed   By: Awilda Metro M.D.   On: 01/14/2017 21:18    Erasmo Downer, MD 01/15/2017, 7:57 AM PGY-3, [facility] FPTS Intern pager: [phone], text pages welcome

## 2017-01-15 NOTE — Progress Notes (Signed)
STROKE TEAM PROGRESS NOTE   HISTORY OF PRESENT ILLNESS (per record) This is a 71-yo RH man who presented to the [facility] ED on 01/13/17 after he fell at his nursing facility. History is largely obtained from the review of the patient's medical record as he has a static encephalopathy that limits his ability to provide much information.   According to notes, he is a resident at [facility]. He was apparently found down by staff sometime yesterday morning. They called 911 and EMS brought him to the ED. Triage RN reported that he appeared to be leaning to his right with no focal weakness. Initial BP was 88/58 with EMS. Per ED MD note, he had difficulty following commands but was able to answer "basic questions." He was documented to have 3/5 strength in BUE and 4/5 strength in BLE. She also noted a L pronator drift and dysmetria with L finger-to-nose. Initial labs in the ED were notable for lactate of 3.5, negative UA, normal CBC, and mildly elevated creatinine. He was bradycardic and hypotensive initially with good improvement in BP after fluid bolus. He was sent for an MRI of the brain and this showed two small areas of acute ischemic stroke. Neurology is consulted for further recommendations.   Last known well: 0800 01/14/17 NHISS score: 1  tPA given?: No, outside of window   SUBJECTIVE (INTERVAL HISTORY) No family at bedside. He has mild MR but fully orientated but psychomotor slowing. No focal neuro deficit. Her stroke more watershed area associated with his dehydration and low BP. He was encouraged to avoid dehydration at facility.    OBJECTIVE Temp:  [98 F (36.7 C)-98.6 F (37 C)] 98.2 F (36.8 C) (05/19 0036) Pulse Rate:  [51-70] 51 (05/19 0036) Cardiac Rhythm: Sinus bradycardia (05/18 1900) Resp:  [18] 18 (05/19 0036) BP: (130-161)/(66-87) 147/66 (05/19 0036) SpO2:  [96 %-100 %] 100 % (05/19 0036)  CBC:  Recent Labs Lab 01/13/17 1214 01/15/17 0330  WBC 6.0 6.5  NEUTROABS  4.0  --   HGB 13.5 12.3*  HCT 39.4 37.7*  MCV 92.3 93.5  PLT 209 200    Basic Metabolic Panel:  Recent Labs Lab 01/14/17 0400 01/15/17 0330  NA 141 141  K 3.5 3.8  CL 108 111  CO2 24 22  GLUCOSE 72 91  BUN 8 7  CREATININE 1.08 1.07  CALCIUM 8.5* 8.5*    Lipid Panel:    Component Value Date/Time   CHOL 133 01/14/2017 0400   TRIG 78 01/14/2017 0400   HDL 49 01/14/2017 0400   CHOLHDL 2.7 01/14/2017 0400   VLDL 16 01/14/2017 0400   LDLCALC 68 01/14/2017 0400   HgbA1c:  Lab Results  Component Value Date   HGBA1C 5.8 (H) 01/14/2017   Urine Drug Screen: No results found for: LABOPIA, COCAINSCRNUR, LABBENZ, AMPHETMU, THCU, LABBARB  Alcohol Level No results found for: [facility]  IMAGING I have personally reviewed the radiological images below and agree with the radiology interpretations.  Ct Angio Head W Or Wo Contrast Ct Angio Neck W Or Wo Contrast 01/14/2017  CT HEAD:  No acute intracranial process. Severe chronic small vessel ischemic disease.  Old small RIGHT frontal lobe infarct and old RIGHT thalamus lacunar infarct. Moderate atrophy.   CTA NECK:  Atherosclerosis without hemodynamically significant stenosis or acute vascular process.   CTA HEAD:  No emergent large vessel occlusion nerves severe stenosis. Moderate to severe calcific atherosclerosis carotid siphons and LEFT vertebral artery.   Ct Head Wo Contrast  01/13/2017 1. No acute intracranial abnormality.  2. Chronic small vessel ischemic disease and brain atrophy   Ct Cervical Spine Wo Contrast 01/13/2017 Cervical spondylosis.  No cervical spine fracture noted.   Mr Brain Wo Contrast (neuro Protocol) 01/13/2017 1. Two subtle acute white matter infarcts in the anterior and superior right frontal lobe, superimposed on chronic encephalomalacia. No associated hemorrhage or mass effect.  2. Additional bilateral confluent cerebral white matter disease favored due to chronic small vessel ischemia. Chronic lacunar  infarct in the right thalamus.   Dg Chest Port 1 View 01/13/2017 No acute cardiopulmonary abnormality seen.  Old right rib fractures.   Transthoracic Echocardiogram 01/14/2017 Study Conclusions - Left ventricle: The cavity size was normal. Wall thickness was   normal. Systolic function was normal. The estimated ejection   fraction was in the range of 50% to 55%. Wall motion was normal;   there were no regional wall motion abnormalities. Features are   consistent with a pseudonormal left ventricular filling pattern,   with concomitant abnormal relaxation and increased filling   pressure (grade 2 diastolic dysfunction). - Left atrium: The atrium was mildly dilated. Impressions: - Normal LV systolic function; moderate diastolic dysfunction; mild   LAE; mild TR.  CUS - Bilateral:  1-39% ICA stenosis.  Vertebral artery flow is antegrade.     PHYSICAL EXAM  Temp:  [97.7 F (36.5 C)-98.4 F (36.9 C)] 97.7 F (36.5 C) (05/19 0957) Pulse Rate:  [51-70] 59 (05/19 0957) Resp:  [18-20] 20 (05/19 0957) BP: (130-161)/(66-80) 137/80 (05/19 0957) SpO2:  [98 %-100 %] 100 % (05/19 0957)  General - Well nourished, well developed, in no apparent distress.  Ophthalmologic - Fundi not visualized due to constant eye movement.  Cardiovascular - Regular rate and rhythm.  Mental Status -  Level of arousal and orientation to time, place, and person were intact Language including expression, naming, repetition, comprehension was assessed and found intact. Psychomotor slowing  Cranial Nerves II - XII - II - Visual field intact OU. III, IV, VI - Extraocular movements intact. V - Facial sensation intact bilaterally. VII - Facial movement intact bilaterally. VIII - Hearing & vestibular intact bilaterally. X - Palate elevates symmetrically. XI - Chin turning & shoulder shrug intact bilaterally. XII - Tongue protrusion intact.  Motor Strength - The patient's strength was normal in all  extremities and pronator drift was absent.  Bulk was normal and fasciculations were absent.   Motor Tone - Muscle tone was assessed at the neck and appendages and was normal.  Reflexes - The patient's reflexes were 1+ in all extremities and he had no pathological reflexes.  Sensory - Light touch, temperature/pinprick were assessed and were symmetrical.    Coordination - The patient had normal movements in the hands with no ataxia or dysmetria.  Mild bilateral postural and action tremor bilaterally.  Gait and Station - deferred   ASSESSMENT/PLAN Mr. Dartanyan Deasis is a 71 y.o. male with history of diabetes mellitus, seizure disorder, ongoing tobacco use, schizophrenia, mental retardation, glaucoma, hypertension, and atrial fibrillation on Elliquis presenting after being found down at his nursing facility with hypotension, bradycardia, difficulty following commands, leaning to the right, and dysmetria. He did not receive IV t-PA due to late presentation.  Stroke:  Two infarcts in the anterior and superior right frontal lobe, watershed pattern, more consistent dehydration and hypotension on admission. Pt does have afib on eliquis, although can not completely rule out afib induced stroke this time, but felt less likely  Resultant  Baseline psychomotor slowing  CT head - No acute intracranial process.   MRI head - Two subtle acute white matter infarcts in the anterior and superior right frontal lobe, consistent with watershed distribution.  CTA H&N - no significant stenosis  Carotid Doppler - Bilateral:  1-39% ICA stenosis.  Vertebral artery flow is antegrade.    2D Echo - EF 50-55%. No cardiac source of embolism identified.  LDL - 68  HgbA1c - 5.8  VTE prophylaxis - Eliquis  Diet Heart Room service appropriate? Yes; Fluid consistency: Thin  Eliquis (apixaban) daily prior to admission, now on Eliquis (apixaban) daily. Continue eliquis on discharge.  Ongoing aggressive stroke risk  factor management  Therapy recommendations: Home health PT and OT recommended.  Disposition:  Pending  Dehydration / hypotension  Low BP on admission, SBP 80s  Responding well to IVF  Cre 1.38 up from baseline 1.08  Pt was advised to avoid dehydration   Afib on eliquis  Not feel to be the cause of the current stroke  Continue eliquis  Hypertension  Stable  Long-term BP goal normotensive  Avoid low BP  Diabetes  HgbA1c 5.8, goal < 7.0  Controlled  Tobacco abuse  Current smoker  Smoking cessation counseling provided  Pt is willing to quit  Other Stroke Risk Factors  Advanced age  Hx stroke/TIA - Chronic lacunar infarct in the right thalamus on MRI  Other Active Problems  Mental retardation / schizophrenia / encephalopathy  Bradycardia - 50s  Hospital day # 2  Neurology will sign off. Please call with questions. No neuro follow up needed at this time.  Marvel Plan, MD PhD Stroke Neurology 01/15/2017 12:02 PM   To contact Stroke Continuity provider, please refer to [email or web address]. After hours, contact General Neurology

## 2017-01-16 NOTE — ED Notes (Signed)
Spoke with nursing home. Sending patient back. [facility] called for transport.

## 2017-01-17 LAB — VAS US CAROTID
LCCADDIAS: -18 cm/s
LEFT ECA DIAS: -1 cm/s
LEFT VERTEBRAL DIAS: -14 cm/s
LICAPDIAS: -17 cm/s
LICAPSYS: -48 cm/s
Left CCA dist sys: -63 cm/s
Left CCA prox dias: 11 cm/s
Left CCA prox sys: 124 cm/s
Left ICA dist dias: -20 cm/s
Left ICA dist sys: -73 cm/s
RCCADSYS: -77 cm/s
RCCAPDIAS: 12 cm/s
RIGHT ECA DIAS: -9 cm/s
RIGHT VERTEBRAL DIAS: -16 cm/s
Right CCA prox sys: 88 cm/s

## 2017-01-18 LAB — CULTURE, BLOOD (ROUTINE X 2)
CULTURE: NO GROWTH
CULTURE: NO GROWTH
SPECIAL REQUESTS: ADEQUATE
Special Requests: ADEQUATE

## 2017-04-21 ENCOUNTER — Ambulatory Visit (HOSPITAL_COMMUNITY)
Admission: RE | Admit: 2017-04-21 | Discharge: 2017-04-21 | Disposition: A | Discharge status: Home / Self Care | Source: Ambulatory Visit | Attending: Nurse Practitioner | Admitting: Nurse Practitioner

## 2017-04-21 VITALS — BP 116/78 | HR 50 | Ht 70.0 in | Wt 174.0 lb

## 2017-04-21 DIAGNOSIS — H409 Unspecified glaucoma: Secondary | ICD-10-CM | POA: Diagnosis not present

## 2017-04-21 DIAGNOSIS — E559 Vitamin D deficiency, unspecified: Secondary | ICD-10-CM | POA: Diagnosis not present

## 2017-04-21 DIAGNOSIS — Z9842 Cataract extraction status, left eye: Secondary | ICD-10-CM | POA: Diagnosis not present

## 2017-04-21 DIAGNOSIS — K219 Gastro-esophageal reflux disease without esophagitis: Secondary | ICD-10-CM | POA: Insufficient documentation

## 2017-04-21 DIAGNOSIS — Z9889 Other specified postprocedural states: Secondary | ICD-10-CM | POA: Diagnosis not present

## 2017-04-21 DIAGNOSIS — Z9841 Cataract extraction status, right eye: Secondary | ICD-10-CM | POA: Diagnosis not present

## 2017-04-21 DIAGNOSIS — N400 Enlarged prostate without lower urinary tract symptoms: Secondary | ICD-10-CM | POA: Insufficient documentation

## 2017-04-21 DIAGNOSIS — E119 Type 2 diabetes mellitus without complications: Secondary | ICD-10-CM | POA: Insufficient documentation

## 2017-04-21 DIAGNOSIS — I4891 Unspecified atrial fibrillation: Secondary | ICD-10-CM | POA: Diagnosis not present

## 2017-04-21 DIAGNOSIS — I10 Essential (primary) hypertension: Secondary | ICD-10-CM | POA: Diagnosis not present

## 2017-04-21 DIAGNOSIS — F17200 Nicotine dependence, unspecified, uncomplicated: Secondary | ICD-10-CM | POA: Diagnosis not present

## 2017-04-21 DIAGNOSIS — I480 Paroxysmal atrial fibrillation: Secondary | ICD-10-CM | POA: Diagnosis not present

## 2017-04-21 DIAGNOSIS — F79 Unspecified intellectual disabilities: Secondary | ICD-10-CM | POA: Diagnosis not present

## 2017-04-21 DIAGNOSIS — Z7901 Long term (current) use of anticoagulants: Secondary | ICD-10-CM | POA: Diagnosis not present

## 2017-04-21 DIAGNOSIS — Z794 Long term (current) use of insulin: Secondary | ICD-10-CM | POA: Insufficient documentation

## 2017-04-21 DIAGNOSIS — Z8249 Family history of ischemic heart disease and other diseases of the circulatory system: Secondary | ICD-10-CM | POA: Diagnosis not present

## 2017-04-21 DIAGNOSIS — F209 Schizophrenia, unspecified: Secondary | ICD-10-CM | POA: Diagnosis not present

## 2017-04-21 LAB — COMPREHENSIVE METABOLIC PANEL
ALBUMIN: 3.7 g/dL (ref 3.5–5.0)
ALT: 18 U/L (ref 17–63)
ANION GAP: 8 (ref 5–15)
AST: 19 U/L (ref 15–41)
Alkaline Phosphatase: 55 U/L (ref 38–126)
BUN: 10 mg/dL (ref 6–20)
CALCIUM: 9 mg/dL (ref 8.9–10.3)
CHLORIDE: 102 mmol/L (ref 101–111)
CO2: 31 mmol/L (ref 22–32)
CREATININE: 1.23 mg/dL (ref 0.61–1.24)
GFR, EST NON AFRICAN AMERICAN: 58 mL/min — AB (ref >60)
Glucose, Bld: 168 mg/dL — ABNORMAL HIGH (ref 65–99)
Potassium: 3.9 mmol/L (ref 3.5–5.1)
Sodium: 141 mmol/L (ref 135–145)
Total Bilirubin: 0.6 mg/dL (ref 0.3–1.2)
Total Protein: 6.8 g/dL (ref 6.5–8.1)

## 2017-04-21 LAB — TSH: TSH: 0.662 u[IU]/mL (ref 0.350–4.500)

## 2017-04-21 NOTE — Progress Notes (Signed)
Patient ID: Jordan Rowe, male   DOB: 15-Feb-1946, 71 y.o.   MRN: [id number]     Primary Care Physician: System, Pcp Not In Referring Physician: [name]   Jordan Rowe is a 71 y.o. male  with history of schizophrenia and intellectually challenged, diabetes mellitus and hypertension was brought from the nursing home to the ER, 09/30/15, after patient was complaining of chest pain.  Patient stated he has been having chest pain off and on for 2 days. Patient was not exactly able to characterize the chest pain. Patient stated the chest pain was in the anterior chest wall. Sometime associated with shortness of breath. Denies any palpitations. In the ER patient was found to be in A. fib with RVR which was a new diagnosis for the patient. Chest x-ray showed congestion. Patient was  started on Cardizem infusion along with heparin and admitted for A. fib with RVR. Patient was chest pain-free on exam. Had one episode of nausea vomiting but abdomen  benign.  Atrial fibrillation with RVR (HCC): duration unknown. Weaned of cardizem gtt. Started on coreg, which was uptitrated for heart rate and blood pressure. Cardiology also started amiodarone oral load.  Heparin drip stopped and patient started on eliquis.CHADSVASC 3.   Systolic dysfunction: echo with EF 20-25%. New diagnosis, was on ACE inhibitor prior to admission. Lisinopril increased to 20 mg a day. Blood pressure was high and antihypertensives were adjusted. Also started on hydralazine No CHF on exam.Home lasix dose was continued. Started on spironolactone. Myoview showed no ischemia. Cleared by cardiology for discharge with outpatient follow-up.  Seen intitially in  afib clinic 3/2, is without attendant. Pt can give simple answers to questions when asked but no complex answers and definitely is not a good historian.In SR, voices no complaints.   F/u in afib clinic 4/4. He is unattended. He denies any chest pain or shortness of breath. Answers "no"  to  any irregular heart beat. Has a heart rate around 50 but tolerates, same as last visit and again answers "no" to any tendency to being lightheaded, presyncopal, dizzy. Overall no complaints. Denies "no" to any bleeding issues.  F/u 9/1, He is with attendant. Had fall and was seen in ER in May, no fx. He denies any irregular heart beat, dizziness,  No obvious fluid. No issues with breathing or chest pain.   F/U 2/23.  He is with an attendant from his nursing facility. He answers simple questions. Wearing O2by Jordan Rowe. Denies any chest pain, palpitations, shortness of breath or bleeding issues. His attendant states that his condition is the same. He continues in sinus brady but without dizziness. States he feels ok. Last echo 12/2016 showed normalization of EF.  F/u in afib clinic, 04/21/17. He is with attendant. Answers  no to c/o irregular heart beat, shortness of breath dizziness, bleeding issues. He was treated in the ED for urinary retention  2/23, hospitalized 5/17 for left arm weakness, CVA, and in the ED for again for 5/19 for oxygen issues. He is in brady today at 29, about his baseline. Continues on amiodarone and eliquis for chadsvasc score of at least 4.    Past Medical History:  Diagnosis Date  . Atrial fibrillation with RVR (HCC)    Jordan Rowe 09/30/2015  . BPH (benign prostatic hypertrophy)    Jordan Rowe 09/30/2015  . Constipation   . Edema   . GERD (gastroesophageal reflux disease)   . Glaucoma   . Hypertension   . Mental retardation    Jordan Rowe  09/30/2015  . Schizophrenia (HCC)   . Seizure (HCC)   . Type II diabetes mellitus (HCC)    Jordan Rowe 09/30/2015  . Vitamin D deficiency    Past Surgical History:  Procedure Laterality Date  . CATARACT EXTRACTION Left 12/2002   Jordan Rowe 01/12/2011  . CATARACT EXTRACTION W/ INTRAOCULAR LENS IMPLANT Right 01/2011   Jordan Rowe 02/18/2011  . MULTIPLE TOOTH EXTRACTIONS      Current Outpatient Prescriptions  Medication Sig Dispense Refill  . acetaminophen  (TYLENOL) 325 MG tablet Take 325-650 mg by mouth every 6 (six) hours as needed for mild pain or moderate pain.     Marland Kitchen amiodarone (PACERONE) 200 MG tablet Take 1 tablet (200 mg total) by mouth daily.    Marland Kitchen apixaban (ELIQUIS) 5 MG TABS tablet Take 1 tablet (5 mg total) by mouth 2 (two) times daily. 60 tablet 0  . cholecalciferol (VITAMIN D) 1000 UNITS tablet Take 1,000 Units by mouth every morning.     . docusate sodium (COLACE) 100 MG capsule Take 100 mg by mouth 2 (two) times daily.    . finasteride (PROSCAR) 5 MG tablet Take 5 mg by mouth every evening.     . furosemide (LASIX) 20 MG tablet Take 20 mg by mouth daily.    . insulin glargine (LANTUS) 100 UNIT/ML injection Inject 0.05 mLs (5 Units total) into the skin at bedtime. Hold is FSBS is less than 75 (Patient taking differently: Inject 8 Units into the skin at bedtime. Hold is FSBS is less than 75) 10 mL 11  . latanoprost (XALATAN) 0.005 % ophthalmic solution Place 1 drop into both eyes at bedtime.    Marland Kitchen omeprazole (PRILOSEC) 20 MG capsule Take 40 mg by mouth every evening. 30 min prior to evening meal.    . Polyethyl Glycol-Propyl Glycol (SYSTANE OP) Place 1 drop into both eyes 4 (four) times daily.    . Polyvinyl Alcohol-Povidone (REFRESH OP) Place 1 drop into both eyes 2 (two) times daily.    . risperiDONE (RISPERDAL) 0.25 MG tablet Take 0.25 mg by mouth at bedtime.    . risperiDONE (RISPERDAL) 2 MG tablet Take 2 mg by mouth at bedtime.    . senna (SENOKOT) 8.6 MG TABS Take 1-2 tablets by mouth See admin instructions. 1 tab in am, 2 tabs in pm    . tamsulosin (FLOMAX) 0.4 MG CAPS capsule Take 1 capsule (0.4 mg total) by mouth daily. 30 capsule 0  . timolol (BETIMOL) 0.5 % ophthalmic solution Place 1 drop into both eyes daily.     No current facility-administered medications for this encounter.     No Known Allergies  Social History   Social History  . Marital status: Divorced    Spouse name: N/A  . Number of children: N/A  . Years  of education: N/A   Occupational History  . Not on file.   Social History Main Topics  . Smoking status: Current Every Day Smoker  . Smokeless tobacco: Never Used  . Alcohol use No  . Drug use: No  . Sexual activity: Not on file   Other Topics Concern  . Not on file   Social History Narrative  . No narrative on file    Family History  Problem Relation Age of Onset  . Hypertension Mother     ROS- All systems are reviewed and negative except as per the HPI above  Physical Exam: Vitals:   04/21/17 1137  BP: 116/78  Pulse: (!) 50  Weight: 174  lb (78.9 kg)  Height: 5\' 10"  (1.778 m)    GEN- The patient is well appearing, alert and oriented x 3 today.   Head- normocephalic, atraumatic Eyes-  Sclera clear, conjunctiva pink Ears- hearing intact Oropharynx- clear Neck- supple, no JVP Lymph- no cervical lymphadenopathy Lungs- Clear to ausculation bilaterally, normal work of breathing Heart- Regular rate and rhythm, no murmurs, rubs or gallops, PMI not laterally displaced GI- soft, NT, ND, + BS Extremities- no clubbing, cyanosis, or edema MS- no significant deformity or atrophy Skin- no rash or lesion Psych- euthymic mood, full affect Neuro- strength and sensation are intact  EKG- sinus vrs junctional  brady at 50 bpm, qrs 86 ms, qtc 472 ms Epic records reviewed Echo-01/14/17-LV EF: 50% -   55%  ------------------------------------------------------------------- Indications:      CVA 436.  ------------------------------------------------------------------- Study Conclusions  - Left ventricle: The cavity size was normal. Wall thickness was   normal. Systolic function was normal. The estimated ejection   fraction was in the range of 50% to 55%. Wall motion was normal;   there were no regional wall motion abnormalities. Features are   consistent with a pseudonormal left ventricular filling pattern,   with concomitant abnormal relaxation and increased filling    pressure (grade 2 diastolic dysfunction). - Left atrium: The atrium was mildly dilated.  Impressions:  - Normal LV systolic function; moderate diastolic dysfunction; mild   LAE; mild TR.  Assessment and Plan: 1.  afib  Maintaining SR with amioderone Continue amiodarone at 200 mg a day Pt is not symptomatic with HR of 50 TSH, CMET today  2. LV dysfunction now normalized from echo result in May Continue lasix Fluid level stable   3. HTN Stable  4. Chadsvasc score of at least 4 Continue eliquis 5 mg bid No bleeding issues reported  F/u in 6 months   Lupita Leash [facility] [address] [place], [place] [address] [phone]

## 2017-04-30 DIAGNOSIS — S12500A Unspecified displaced fracture of sixth cervical vertebra, initial encounter for closed fracture: Secondary | ICD-10-CM

## 2017-04-30 DIAGNOSIS — I629 Nontraumatic intracranial hemorrhage, unspecified: Secondary | ICD-10-CM

## 2017-04-30 HISTORY — DX: Nontraumatic intracranial hemorrhage, unspecified: I62.9

## 2017-04-30 HISTORY — DX: Unspecified displaced fracture of sixth cervical vertebra, initial encounter for closed fracture: S12.500A

## 2017-05-08 ENCOUNTER — Observation Stay (HOSPITAL_COMMUNITY)
Admission: EM | Admit: 2017-05-08 | Discharge: 2017-05-09 | Disposition: A | Discharge status: Skilled Nursing Facility | Attending: Internal Medicine | Admitting: Internal Medicine

## 2017-05-08 ENCOUNTER — Encounter (HOSPITAL_COMMUNITY): Payer: Self-pay | Admitting: Emergency Medicine

## 2017-05-08 ENCOUNTER — Emergency Department (HOSPITAL_COMMUNITY)

## 2017-05-08 DIAGNOSIS — S12501A Unspecified nondisplaced fracture of sixth cervical vertebra, initial encounter for closed fracture: Secondary | ICD-10-CM

## 2017-05-08 DIAGNOSIS — Z66 Do not resuscitate: Secondary | ICD-10-CM | POA: Insufficient documentation

## 2017-05-08 DIAGNOSIS — I10 Essential (primary) hypertension: Secondary | ICD-10-CM | POA: Diagnosis not present

## 2017-05-08 DIAGNOSIS — I69354 Hemiplegia and hemiparesis following cerebral infarction affecting left non-dominant side: Secondary | ICD-10-CM | POA: Insufficient documentation

## 2017-05-08 DIAGNOSIS — K219 Gastro-esophageal reflux disease without esophagitis: Secondary | ICD-10-CM | POA: Diagnosis not present

## 2017-05-08 DIAGNOSIS — Z7901 Long term (current) use of anticoagulants: Secondary | ICD-10-CM | POA: Insufficient documentation

## 2017-05-08 DIAGNOSIS — S06300A Unspecified focal traumatic brain injury without loss of consciousness, initial encounter: Secondary | ICD-10-CM

## 2017-05-08 DIAGNOSIS — Z794 Long term (current) use of insulin: Secondary | ICD-10-CM

## 2017-05-08 DIAGNOSIS — R51 Headache: Secondary | ICD-10-CM | POA: Diagnosis present

## 2017-05-08 DIAGNOSIS — Z79899 Other long term (current) drug therapy: Secondary | ICD-10-CM | POA: Diagnosis not present

## 2017-05-08 DIAGNOSIS — I480 Paroxysmal atrial fibrillation: Secondary | ICD-10-CM

## 2017-05-08 DIAGNOSIS — E119 Type 2 diabetes mellitus without complications: Secondary | ICD-10-CM | POA: Insufficient documentation

## 2017-05-08 DIAGNOSIS — S0630AA Unspecified focal traumatic brain injury with loss of consciousness status unknown, initial encounter: Secondary | ICD-10-CM | POA: Diagnosis present

## 2017-05-08 DIAGNOSIS — S12500A Unspecified displaced fracture of sixth cervical vertebra, initial encounter for closed fracture: Secondary | ICD-10-CM | POA: Diagnosis not present

## 2017-05-08 DIAGNOSIS — F209 Schizophrenia, unspecified: Secondary | ICD-10-CM | POA: Insufficient documentation

## 2017-05-08 DIAGNOSIS — I609 Nontraumatic subarachnoid hemorrhage, unspecified: Secondary | ICD-10-CM

## 2017-05-08 DIAGNOSIS — E1121 Type 2 diabetes mellitus with diabetic nephropathy: Secondary | ICD-10-CM

## 2017-05-08 DIAGNOSIS — S06309A Unspecified focal traumatic brain injury with loss of consciousness of unspecified duration, initial encounter: Secondary | ICD-10-CM | POA: Diagnosis present

## 2017-05-08 DIAGNOSIS — F17210 Nicotine dependence, cigarettes, uncomplicated: Secondary | ICD-10-CM | POA: Diagnosis not present

## 2017-05-08 DIAGNOSIS — W19XXXA Unspecified fall, initial encounter: Secondary | ICD-10-CM | POA: Insufficient documentation

## 2017-05-08 DIAGNOSIS — I629 Nontraumatic intracranial hemorrhage, unspecified: Secondary | ICD-10-CM | POA: Diagnosis not present

## 2017-05-08 DIAGNOSIS — S066X9A Traumatic subarachnoid hemorrhage with loss of consciousness of unspecified duration, initial encounter: Secondary | ICD-10-CM | POA: Diagnosis not present

## 2017-05-08 DIAGNOSIS — S00532A Contusion of oral cavity, initial encounter: Secondary | ICD-10-CM

## 2017-05-08 DIAGNOSIS — I4891 Unspecified atrial fibrillation: Secondary | ICD-10-CM | POA: Insufficient documentation

## 2017-05-08 DIAGNOSIS — F79 Unspecified intellectual disabilities: Secondary | ICD-10-CM | POA: Insufficient documentation

## 2017-05-08 DIAGNOSIS — I5032 Chronic diastolic (congestive) heart failure: Secondary | ICD-10-CM

## 2017-05-08 DIAGNOSIS — N400 Enlarged prostate without lower urinary tract symptoms: Secondary | ICD-10-CM

## 2017-05-08 HISTORY — DX: Cerebral infarction, unspecified: I63.9

## 2017-05-08 LAB — I-STAT CHEM 8, ED
BUN: 12 mg/dL (ref 6–20)
CREATININE: 1 mg/dL (ref 0.61–1.24)
Calcium, Ion: 1.01 mmol/L — ABNORMAL LOW (ref 1.15–1.40)
Chloride: 103 mmol/L (ref 101–111)
Glucose, Bld: 146 mg/dL — ABNORMAL HIGH (ref 65–99)
HEMATOCRIT: 45 % (ref 39.0–52.0)
HEMOGLOBIN: 15.3 g/dL (ref 13.0–17.0)
POTASSIUM: 3.9 mmol/L (ref 3.5–5.1)
SODIUM: 138 mmol/L (ref 135–145)
TCO2: 27 mmol/L (ref 22–32)

## 2017-05-08 LAB — BASIC METABOLIC PANEL
ANION GAP: 8 (ref 5–15)
BUN: 11 mg/dL (ref 6–20)
CALCIUM: 8.9 mg/dL (ref 8.9–10.3)
CO2: 27 mmol/L (ref 22–32)
CREATININE: 1.09 mg/dL (ref 0.61–1.24)
Chloride: 104 mmol/L (ref 101–111)
Glucose, Bld: 142 mg/dL — ABNORMAL HIGH (ref 65–99)
POTASSIUM: 4 mmol/L (ref 3.5–5.1)
SODIUM: 139 mmol/L (ref 135–145)

## 2017-05-08 LAB — CBC
HCT: 43.6 % (ref 39.0–52.0)
Hemoglobin: 14.5 g/dL (ref 13.0–17.0)
MCH: 30.9 pg (ref 26.0–34.0)
MCHC: 33.3 g/dL (ref 30.0–36.0)
MCV: 92.8 fL (ref 78.0–100.0)
PLATELETS: 172 10*3/uL (ref 150–400)
RBC: 4.7 MIL/uL (ref 4.22–5.81)
RDW: 13.3 % (ref 11.5–15.5)
WBC: 9.2 10*3/uL (ref 4.0–10.5)

## 2017-05-08 LAB — MRSA PCR SCREENING: MRSA BY PCR: NEGATIVE

## 2017-05-08 LAB — GLUCOSE, CAPILLARY: GLUCOSE-CAPILLARY: 137 mg/dL — AB (ref 65–99)

## 2017-05-08 LAB — PROTIME-INR
INR: 1.17
PROTHROMBIN TIME: 14.8 s (ref 11.4–15.2)

## 2017-05-08 MED ORDER — CARVEDILOL 12.5 MG PO TABS
25.0000 mg | ORAL_TABLET | Freq: Once | ORAL | Status: AC
  Administered 2017-05-08: 25 mg via ORAL
  Filled 2017-05-08: qty 2

## 2017-05-08 MED ORDER — HYDROCODONE-ACETAMINOPHEN 5-325 MG PO TABS: 1.0000 | ORAL_TABLET | ORAL | Status: DC | PRN

## 2017-05-08 MED ORDER — ACETAMINOPHEN 325 MG PO TABS: 650.0000 mg | ORAL_TABLET | ORAL | Status: DC | PRN

## 2017-05-08 MED ORDER — ONDANSETRON 4 MG PO TBDP: 4.0000 mg | ORAL_TABLET | Freq: Four times a day (QID) | ORAL | Status: DC | PRN

## 2017-05-08 MED ORDER — PROTHROMBIN COMPLEX CONC HUMAN 500 UNITS IV KIT
4404.0000 [IU] | PACK | Status: AC
  Administered 2017-05-08: 4404 [IU] via INTRAVENOUS
  Filled 2017-05-08: qty 176.16

## 2017-05-08 MED ORDER — PANTOPRAZOLE SODIUM 40 MG PO TBEC
80.0000 mg | DELAYED_RELEASE_TABLET | Freq: Every day | ORAL | Status: DC
  Administered 2017-05-08 – 2017-05-09 (×2): 80 mg via ORAL
  Filled 2017-05-08 (×2): qty 2

## 2017-05-08 MED ORDER — TIMOLOL MALEATE 0.5 % OP SOLN
1.0000 [drp] | Freq: Every day | OPHTHALMIC | Status: DC
  Administered 2017-05-08 – 2017-05-09 (×2): 1 [drp] via OPHTHALMIC
  Filled 2017-05-08: qty 5

## 2017-05-08 MED ORDER — ONDANSETRON HCL 4 MG/2ML IJ SOLN: 4.0000 mg | Freq: Four times a day (QID) | INTRAMUSCULAR | Status: DC | PRN

## 2017-05-08 MED ORDER — FUROSEMIDE 20 MG PO TABS
20.0000 mg | ORAL_TABLET | Freq: Every day | ORAL | Status: DC
  Administered 2017-05-08 – 2017-05-09 (×2): 20 mg via ORAL
  Filled 2017-05-08 (×2): qty 1

## 2017-05-08 MED ORDER — INSULIN ASPART 100 UNIT/ML ~~LOC~~ SOLN: 0.0000 [IU] | Freq: Every day | SUBCUTANEOUS | Status: DC

## 2017-05-08 MED ORDER — ARTIFICIAL TEARS OPHTHALMIC OINT
TOPICAL_OINTMENT | Freq: Four times a day (QID) | OPHTHALMIC | Status: DC
  Administered 2017-05-09: 1 via OPHTHALMIC
  Administered 2017-05-09 (×2): via OPHTHALMIC
  Filled 2017-05-08: qty 3.5

## 2017-05-08 MED ORDER — LATANOPROST 0.005 % OP SOLN
1.0000 [drp] | Freq: Every day | OPHTHALMIC | Status: DC
  Administered 2017-05-08: 1 [drp] via OPHTHALMIC
  Filled 2017-05-08: qty 2.5

## 2017-05-08 MED ORDER — CARBOXYMETHYLCELLULOSE SODIUM 1 % OP SOLN: 1.0000 [drp] | Freq: Two times a day (BID) | OPHTHALMIC | Status: DC

## 2017-05-08 MED ORDER — POLYETHYL GLYCOL-PROPYL GLYCOL 0.4-0.3 % OP GEL: Freq: Four times a day (QID) | OPHTHALMIC | Status: DC

## 2017-05-08 MED ORDER — INSULIN GLARGINE 100 UNIT/ML ~~LOC~~ SOLN
8.0000 [IU] | Freq: Every day | SUBCUTANEOUS | Status: DC
  Administered 2017-05-08: 8 [IU] via SUBCUTANEOUS
  Filled 2017-05-08 (×2): qty 0.08

## 2017-05-08 MED ORDER — HYDRALAZINE HCL 20 MG/ML IJ SOLN: 10.0000 mg | INTRAMUSCULAR | Status: DC | PRN

## 2017-05-08 MED ORDER — SENNA 8.6 MG PO TABS
1.0000 | ORAL_TABLET | Freq: Every day | ORAL | Status: DC
  Administered 2017-05-08 – 2017-05-09 (×2): 8.6 mg via ORAL
  Filled 2017-05-08 (×2): qty 1

## 2017-05-08 MED ORDER — HYDROMORPHONE HCL 1 MG/ML IJ SOLN: 0.5000 mg | INTRAMUSCULAR | Status: DC | PRN

## 2017-05-08 MED ORDER — RISPERIDONE 0.25 MG PO TABS
0.2500 mg | ORAL_TABLET | ORAL | Status: DC
  Administered 2017-05-09: 0.25 mg via ORAL
  Filled 2017-05-08: qty 1

## 2017-05-08 MED ORDER — FINASTERIDE 5 MG PO TABS
5.0000 mg | ORAL_TABLET | Freq: Every evening | ORAL | Status: DC
  Administered 2017-05-08: 5 mg via ORAL
  Filled 2017-05-08: qty 1

## 2017-05-08 MED ORDER — CHLORHEXIDINE GLUCONATE 0.12 % MT SOLN
15.0000 mL | Freq: Three times a day (TID) | OROMUCOSAL | Status: DC
  Administered 2017-05-08 – 2017-05-09 (×2): 15 mL via OROMUCOSAL
  Filled 2017-05-08 (×2): qty 15

## 2017-05-08 MED ORDER — AMIODARONE HCL 200 MG PO TABS
200.0000 mg | ORAL_TABLET | Freq: Every day | ORAL | Status: DC
  Administered 2017-05-08 – 2017-05-09 (×2): 200 mg via ORAL
  Filled 2017-05-08 (×2): qty 1

## 2017-05-08 MED ORDER — LABETALOL HCL 5 MG/ML IV SOLN
10.0000 mg | Freq: Once | INTRAVENOUS | Status: DC
  Filled 2017-05-08: qty 4

## 2017-05-08 MED ORDER — TAMSULOSIN HCL 0.4 MG PO CAPS
0.4000 mg | ORAL_CAPSULE | Freq: Every day | ORAL | Status: DC
  Administered 2017-05-08 – 2017-05-09 (×2): 0.4 mg via ORAL
  Filled 2017-05-08 (×2): qty 1

## 2017-05-08 MED ORDER — OLOPATADINE HCL 0.1 % OP SOLN
1.0000 [drp] | OPHTHALMIC | Status: DC
  Administered 2017-05-09: 1 [drp] via OPHTHALMIC
  Filled 2017-05-08: qty 5

## 2017-05-08 MED ORDER — TIMOLOL HEMIHYDRATE 0.5 % OP SOLN: 1.0000 [drp] | Freq: Every day | OPHTHALMIC | Status: DC

## 2017-05-08 MED ORDER — INSULIN ASPART 100 UNIT/ML ~~LOC~~ SOLN
0.0000 [IU] | Freq: Three times a day (TID) | SUBCUTANEOUS | Status: DC
  Administered 2017-05-09 (×2): 2 [IU] via SUBCUTANEOUS

## 2017-05-08 MED ORDER — CARVEDILOL 25 MG PO TABS
25.0000 mg | ORAL_TABLET | Freq: Two times a day (BID) | ORAL | Status: DC
  Administered 2017-05-09: 25 mg via ORAL
  Filled 2017-05-08: qty 1

## 2017-05-08 MED ORDER — RISPERIDONE 2 MG PO TABS
2.0000 mg | ORAL_TABLET | Freq: Every day | ORAL | Status: DC
  Administered 2017-05-08: 2 mg via ORAL
  Filled 2017-05-08 (×2): qty 1

## 2017-05-08 MED ORDER — BISACODYL 10 MG RE SUPP: 10.0000 mg | RECTAL | Status: DC | PRN

## 2017-05-08 NOTE — ED Notes (Signed)
RN called [facility] for their story- Pam, night shift med tech states pt called out using call bell saying he had rolled out of bed, CNA went to assist patient and found him sitting on the side of the bed; Pam denies any known LOC, seizure-like activity, or confusion

## 2017-05-08 NOTE — ED Notes (Signed)
Patient transported to CT 

## 2017-05-08 NOTE — ED Notes (Signed)
MD made aware of patient's BP  

## 2017-05-08 NOTE — ED Triage Notes (Signed)
Pt presents with [facility] from [facility] for injuries from an unwitnessed fall; pt has dried blood to lower half of face; pt denies pain, arrives in c-collar as precaution; pt states he was getting up to use the bathroom when he fell, staff found him lying in bed with blood on face; takes eliquis; CAOx3 at this time

## 2017-05-08 NOTE — ED Notes (Signed)
Hospitalist at the bedside 

## 2017-05-08 NOTE — Clinical Social Work Note (Signed)
Clinical Social Work Assessment  Patient Details  Name: Jordan Rowe MRN: [id number] Date of Birth: [birth year]  Date of referral:  05/08/17               Reason for consult:  Discharge Planning                Permission sought to share information with:  Case Manager, Facility Medical sales representative, Other Permission granted to share information::  Yes, Verbal Permission Granted  Name::        Agency::  [facility]  Relationship::     Contact Information:     Housing/Transportation Living arrangements for the past 2 months:  Assisted Living Facility Source of Information:  Patient, Medical Team, Facility Patient Interpreter Needed:  None Criminal Activity/Legal Involvement Pertinent to Current Situation/Hospitalization:  No - Comment as needed Significant Relationships:  Other Family Members, Community Support Lives with:  Self, Facility Resident Do you feel safe going back to the place where you live?  Yes Need for family participation in patient care:  Yes (Comment)  Care giving concerns:  Patient admitted to [facility] ED due to unwitnessed fall at ALF.  He called staff was found to be bleeding from his face and swollen than his lip was sent here for further evaluation.  Patient is followed in the community by [facility] for end stage cardiac disease and they are following/aware of admission. Patient to return at discharge per attending.  No barriers with return.    Social Worker assessment / plan:  Assessment completed in ED as patient to be admitted for observation at this time. Plan will be for patient to return at DC to [facility]. Patient not a surgical candidate at this time.  Will update FL2 and help facilitate discharge plans when medically appropriate.  Employment status:  Disabled (Comment on whether or not currently receiving Disability) Insurance information:  Medicare, Medicaid In [place] PT Recommendations:  Not assessed at  this time Information / Referral to community resources:     Patient/Family's Response to care:  Patient agreeable to plan  Patient/Family's Understanding of and Emotional Response to Diagnosis, Current Treatment, and Prognosis:  Patient aware of fall and reason for admission.  Still assessing other treatments and prognosis as patient only seen in ED.   Emotional Assessment Appearance:  Appears stated age Attitude/Demeanor/Rapport:    Affect (typically observed):  Accepting, Adaptable Orientation:  Oriented to Self, Oriented to Place, Oriented to  Time, Oriented to Situation Alcohol / Substance use:  Not Applicable Psych involvement (Current and /or in the community):  Yes (Comment) (Dx schizophrenia, med: Risperadal)  Discharge Needs  Concerns to be addressed:  Care Coordination Readmission within the last 30 days:  No Current discharge risk:  None Barriers to Discharge:  No Barriers Identified, Continued Medical Work up (Patient to be admitted, currently under observation)   [name], LCSW 05/08/2017, 12:46 PM

## 2017-05-08 NOTE — H&P (Signed)
History and Physical  Jordan Rowe ZOX:[id number] DOB: 04-08-1946 DOA: 05/08/2017  Referring physician: Dr Patria Mane, ED physician PCP: System, Pcp Not In  Outpatient Specialists: none  Patient Coming From: [facility]  Chief Complaint: unwitnessed fall, trauma to face.  HPI: Jordan Rowe is a 71 y.o. male with a history of GERD, hypertension, cognitive decline, BPH, atrial fibrillation rate controlled, diabetes type 2 on insulin, schizophrenia, history of stroke with mild residual left-sided deficits. Patient lives at [facility]. Patient had an unwitnessed fall around 4 AM. When staff arrived to the patient's room, they noted he had blood on his face. EMS was called and they brought him to the emergency room for evaluation. The patient has remained alert and oriented 4. He does have some facial pain. The patient does have a brother who is power of attorney.  Emergency Department Course: CT of the head and neck show acute left sylvian fissure subarachnoid hemorrhage. CT of the neck shows acute nondisplaced C6 anterior-inferior endplate fracture that neurosurgery believes is a chip of an osteophyte. Due to the patient being on eliquis, the patient was reversed with Kcentra.  Review of Systems:   Pt denies any fevers, chills, nausea, vomiting, diarrhea, constipation, abdominal pain, shortness of breath, dyspnea on exertion, orthopnea, cough, wheezing, palpitations, headache, vision changes, lightheadedness, dizziness, melena, rectal bleeding.  Review of systems are otherwise negative  Past Medical History:  Diagnosis Date  . Atrial fibrillation with RVR (HCC)    [name] 09/30/2015  . BPH (benign prostatic hypertrophy)    [name] 09/30/2015  . Constipation   . Edema   . GERD (gastroesophageal reflux disease)   . Glaucoma   . Hypertension   . Mental retardation    [name] 09/30/2015  . Schizophrenia (HCC)   . Seizure (HCC)   . Type II diabetes mellitus (HCC)    [name] 09/30/2015   . Vitamin D deficiency    Past Surgical History:  Procedure Laterality Date  . CATARACT EXTRACTION Left 12/2002   [name] 01/12/2011  . CATARACT EXTRACTION W/ INTRAOCULAR LENS IMPLANT Right 01/2011   [name] 02/18/2011  . MULTIPLE TOOTH EXTRACTIONS     Social History:  reports that he has been smoking.  He has never used smokeless tobacco. He reports that he does not drink alcohol or use drugs. Patient lives at [facility]  No Known Allergies  Family History  Problem Relation Age of Onset  . Hypertension Mother       Prior to Admission medications   Medication Sig Start Date End Date Taking? Authorizing Provider  acetaminophen (TYLENOL) 325 MG tablet Take 325-650 mg by mouth every 6 (six) hours as needed for mild pain or moderate pain.    Yes [provider]  amiodarone (PACERONE) 200 MG tablet Take 1 tablet (200 mg total) by mouth daily. 10/30/15  Yes Newman Nip, NP  apixaban (ELIQUIS) 5 MG TABS tablet Take 1 tablet (5 mg total) by mouth 2 (two) times daily. 10/07/15  Yes Christiane Ha, MD  bisacodyl (DULCOLAX) 10 MG suppository Place 10 mg rectally as needed for moderate constipation.   Yes [provider]  carvedilol (COREG) 25 MG tablet Take 25 mg by mouth 2 (two) times daily with a meal.   Yes [provider]  cholecalciferol (VITAMIN D) 1000 UNITS tablet Take 1,000 Units by mouth every morning.    Yes [provider]  finasteride (PROSCAR) 5 MG tablet Take 5 mg by mouth every evening.  Yes [provider]  furosemide (LASIX) 20 MG tablet Take 20 mg by mouth daily.   Yes [provider]  insulin glargine (LANTUS) 100 UNIT/ML injection Inject 0.05 mLs (5 Units total) into the skin at bedtime. Hold is FSBS is less than 75 Patient taking differently: Inject 8 Units into the skin at bedtime. Hold is FSBS is less than 75 10/07/15  Yes Lendell Caprice, Corinna L, MD  latanoprost (XALATAN) 0.005 % ophthalmic solution Place 1 drop  into both eyes at bedtime.   Yes [provider]  olopatadine (PATANOL) 0.1 % ophthalmic solution Place 1 drop into both eyes every morning.   Yes [provider]  omeprazole (PRILOSEC) 20 MG capsule Take 40 mg by mouth every evening. 30 min prior to evening meal.   Yes [provider]  Polyethyl Glycol-Propyl Glycol (SYSTANE OP) Place 1 drop into both eyes 4 (four) times daily.   Yes [provider]  Polyvinyl Alcohol-Povidone (REFRESH OP) Place 1 drop into both eyes 2 (two) times daily.   Yes [provider]  risperiDONE (RISPERDAL) 0.25 MG tablet Take 0.25 mg by mouth every morning.    Yes [provider]  risperiDONE (RISPERDAL) 2 MG tablet Take 2 mg by mouth at bedtime.   Yes [provider]  senna (SENOKOT) 8.6 MG TABS Take 1 tablet by mouth daily.    Yes [provider]  tamsulosin (FLOMAX) 0.4 MG CAPS capsule Take 1 capsule (0.4 mg total) by mouth daily. 10/22/16  Yes West, Emily, PA-C  timolol (BETIMOL) 0.5 % ophthalmic solution Place 1 drop into both eyes daily.   Yes [provider]    Physical Exam: BP (!) 164/94 (BP Location: Right Arm)   Pulse (!) 51   Temp 98.7 F (37.1 C)   Resp 19   Ht  (1.753 m)   Wt 83.9 kg (185 lb)   SpO2 99%   BMI 27.32 kg/m   General: Elderly black male. Awake and alert and oriented x3. No acute cardiopulmonary distress.  HEENT: Normocephalic atraumatic.  Right and left ears normal in appearance.  Pupils equal, round, reactive to light. Extraocular muscles are intact. Sclerae anicteric and noninjected.  Moist mucosal membranes. No mucosal lesions.  Neck: Neck supple without lymphadenopathy. No carotid bruits. No masses palpated.  Cardiovascular: Regular rate with normal S1-S2 sounds. No murmurs, rubs, gallops auscultated. No JVD.  Respiratory: Good respiratory effort with no wheezes, rales, rhonchi. Lungs clear to auscultation bilaterally.  No accessory muscle  use. Abdomen: Soft, nontender, nondistended. Active bowel sounds. No masses or hepatosplenomegaly  Skin: No rashes, lesions, or ulcerations.  Dry, warm to touch. 2+ dorsalis pedis and radial pulses. Musculoskeletal: No calf or leg pain. All major joints not erythematous nontender.  No upper or lower joint deformation.  Good ROM.  No contractures  Psychiatric: Intact judgment and insight. Pleasant and cooperative. Neurologic: Patient mildly somnolent. Has slight drift of left arm. Strength is 5/5 and symmetric in upper and lower extremities.  Cranial nerves II through XII are grossly intact.           Labs on Admission: I have personally reviewed following labs and imaging studies  CBC:  Recent Labs Lab 05/08/17 0920 05/08/17 0931  WBC 9.2  --   HGB 14.5 15.3  HCT 43.6 45.0  MCV 92.8  --   PLT 172  --    Basic Metabolic Panel:  Recent Labs Lab 05/08/17 0920 05/08/17 0931  NA 139 138  K 4.0 3.9  CL 104 103  CO2 27  --   GLUCOSE 142* 146*  BUN 11 12  CREATININE 1.09 1.00  CALCIUM 8.9  --    GFR: Estimated Creatinine Clearance: 69.7 mL/min (by C-G formula based on SCr of 1 mg/dL). Liver Function Tests: No results for input(s): AST, ALT, ALKPHOS, BILITOT, PROT, ALBUMIN in the last 168 hours. No results for input(s): LIPASE, AMYLASE in the last 168 hours. No results for input(s): AMMONIA in the last 168 hours. Coagulation Profile:  Recent Labs Lab 05/08/17 0804  INR 1.17   Cardiac Enzymes: No results for input(s): CKTOTAL, CKMB, CKMBINDEX, TROPONINI in the last 168 hours. BNP (last 3 results) No results for input(s): PROBNP in the last 8760 hours. HbA1C: No results for input(s): HGBA1C in the last 72 hours. CBG: No results for input(s): GLUCAP in the last 168 hours. Lipid Profile: No results for input(s): CHOL, HDL, LDLCALC, TRIG, CHOLHDL, LDLDIRECT in the last 72 hours. Thyroid Function Tests: No results for input(s): TSH, T4TOTAL, FREET4, T3FREE, THYROIDAB  in the last 72 hours. Anemia Panel: No results for input(s): VITAMINB12, FOLATE, FERRITIN, TIBC, IRON, RETICCTPCT in the last 72 hours. Urine analysis:    Component Value Date/Time   COLORURINE YELLOW 01/13/2017 1343   APPEARANCEUR CLEAR 01/13/2017 1343   LABSPEC 1.005 01/13/2017 1343   PHURINE 5.0 01/13/2017 1343   GLUCOSEU NEGATIVE 01/13/2017 1343   HGBUR NEGATIVE 01/13/2017 1343   BILIRUBINUR NEGATIVE 01/13/2017 1343   KETONESUR NEGATIVE 01/13/2017 1343   PROTEINUR NEGATIVE 01/13/2017 1343   UROBILINOGEN 1.0 02/01/2013 2300   NITRITE NEGATIVE 01/13/2017 1343   LEUKOCYTESUR NEGATIVE 01/13/2017 1343   Sepsis Labs: (procalcitonin:4,lacticidven:4) )No results found for this or any previous visit (from the past 240 hour(s)).   Radiological Exams on Admission: Ct Head Wo Contrast  Result Date: 05/08/2017 CLINICAL DATA:  Larey Seat on way to bathroom. On blood thinner. History of seizures, atrial fibrillation, diabetes, hypertension. EXAM: CT HEAD WITHOUT CONTRAST CT MAXILLOFACIAL WITHOUT CONTRAST CT CERVICAL SPINE WITHOUT CONTRAST TECHNIQUE: Multidetector CT imaging of the head, cervical spine, and maxillofacial structures were performed using the standard protocol without intravenous contrast. Multiplanar CT image reconstructions of the cervical spine and maxillofacial structures were also generated. COMPARISON:  CT HEAD Jan 14, 2017 and CT cervical spine Jan 13, 2017 FINDINGS: CT HEAD FINDINGS BRAIN: Subcentimeter hemorrhage LEFT sylvian fissure versus posterior insula (axial 19/35). No mass effect, midline shift or acute large vascular territory infarcts. 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 Small area RIGHT frontal lobe encephalomalacia. Confluent supratentorial white matter hypodensities. Moderate ventriculomegaly on the basis of global parenchymal brain volume loss, stable. No abnormal extra-axial fluid collections. Basal cisterns are patent. VASCULAR: Moderate to severe calcific  atherosclerosis of the carotid siphons and to lesser extent LEFT vertebral artery. SKULL: No skull fracture. No significant scalp soft tissue swelling. OTHER: None. CT MAXILLOFACIAL FINDINGS OSSEOUS: The mandible is intact, the condyles are located. No acute facial fracture. No destructive bony lesions. Patient is edentulous. ORBITS: Ocular globes and orbital contents are nonacute. Status post bilateral ocular lens implants. SINUSES: Mild paranasal sinus mucosal thickening. Nasal septum is midline. Included mastoid aircells are well aerated. SOFT TISSUES: RIGHT perioral soft tissue swelling and subcutaneous gas without radiopaque foreign bodies. CT CERVICAL SPINE FINDINGS ALIGNMENT: Maintained lordosis. Vertebral bodies in alignment. SKULL BASE AND VERTEBRAE: Acute Anteroinferior C6 endplate corner fracture. Moderate to severe C5-6 and C6-7 degenerative discs. No destructive bony lesions. C1-2 articulation maintained. Nuchal ligament calcifications present. SOFT TISSUES AND  SPINAL CANAL: Nonacute. Mild calcific atherosclerosis carotid bifurcations. DISC LEVELS: No significant osseous canal stenosis. Severe RIGHT C5-6 and moderate bile lateral C6-7 neural foraminal narrowing. UPPER CHEST: Lung apices are clear. OTHER: None. IMPRESSION: CT HEAD: 1. Acute subcentimeter LEFT sylvian fissure subarachnoid hemorrhage versus insular hemorrhagic contusion. 2. Severe chronic small vessel ischemic disease. 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 RIGHT frontal lobe encephalomalacia. 3. Stable moderate atrophy. CT MAXILLOFACIAL: 1. No acute facial fracture 2. Perioral soft tissue swelling and laceration. No radiopaque foreign bodies. CT CERVICAL SPINE: 1. Acute nondisplaced C6 Anteroinferior endplate fracture. No malalignment. Critical Value/emergent results were called by telephone at the time of interpretation on 05/08/2017 at 6:05 am to Dr. Azalia Bilis , who verbally acknowledged these results. Electronically Signed   By: Awilda Metro M.D.   On: 05/08/2017 06:08   Ct Cervical Spine Wo Contrast  Result Date: 05/08/2017 CLINICAL DATA:  Larey Seat on way to bathroom. On blood thinner. History of seizures, atrial fibrillation, diabetes, hypertension. EXAM: CT HEAD WITHOUT CONTRAST CT MAXILLOFACIAL WITHOUT CONTRAST CT CERVICAL SPINE WITHOUT CONTRAST TECHNIQUE: Multidetector CT imaging of the head, cervical spine, and maxillofacial structures were performed using the standard protocol without intravenous contrast. Multiplanar CT image reconstructions of the cervical spine and maxillofacial structures were also generated. COMPARISON:  CT HEAD Jan 14, 2017 and CT cervical spine Jan 13, 2017 FINDINGS: CT HEAD FINDINGS BRAIN: Subcentimeter hemorrhage LEFT sylvian fissure versus posterior insula (axial 19/35). No mass effect, midline shift or acute large vascular territory infarcts. 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 Small area RIGHT frontal lobe encephalomalacia. Confluent supratentorial white matter hypodensities. Moderate ventriculomegaly on the basis of global parenchymal brain volume loss, stable. No abnormal extra-axial fluid collections. Basal cisterns are patent. VASCULAR: Moderate to severe calcific atherosclerosis of the carotid siphons and to lesser extent LEFT vertebral artery. SKULL: No skull fracture. No significant scalp soft tissue swelling. OTHER: None. CT MAXILLOFACIAL FINDINGS OSSEOUS: The mandible is intact, the condyles are located. No acute facial fracture. No destructive bony lesions. Patient is edentulous. ORBITS: Ocular globes and orbital contents are nonacute. Status post bilateral ocular lens implants. SINUSES: Mild paranasal sinus mucosal thickening. Nasal septum is midline. Included mastoid aircells are well aerated. SOFT TISSUES: RIGHT perioral soft tissue swelling and subcutaneous gas without radiopaque foreign bodies. CT CERVICAL SPINE FINDINGS ALIGNMENT: Maintained lordosis. Vertebral bodies in alignment. SKULL BASE  AND VERTEBRAE: Acute Anteroinferior C6 endplate corner fracture. Moderate to severe C5-6 and C6-7 degenerative discs. No destructive bony lesions. C1-2 articulation maintained. Nuchal ligament calcifications present. SOFT TISSUES AND SPINAL CANAL: Nonacute. Mild calcific atherosclerosis carotid bifurcations. DISC LEVELS: No significant osseous canal stenosis. Severe RIGHT C5-6 and moderate bile lateral C6-7 neural foraminal narrowing. UPPER CHEST: Lung apices are clear. OTHER: None. IMPRESSION: CT HEAD: 1. Acute subcentimeter LEFT sylvian fissure subarachnoid hemorrhage versus insular hemorrhagic contusion. 2. Severe chronic small vessel ischemic disease. 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 RIGHT frontal lobe encephalomalacia. 3. Stable moderate atrophy. CT MAXILLOFACIAL: 1. No acute facial fracture 2. Perioral soft tissue swelling and laceration. No radiopaque foreign bodies. CT CERVICAL SPINE: 1. Acute nondisplaced C6 Anteroinferior endplate fracture. No malalignment. Critical Value/emergent results were called by telephone at the time of interpretation on 05/08/2017 at 6:05 am to Dr. Azalia Bilis , who verbally acknowledged these results. Electronically Signed   By: Awilda Metro M.D.   On: 05/08/2017 06:08   Ct Maxillofacial Wo Contrast  Result Date: 05/08/2017 CLINICAL DATA:  Larey Seat on way to bathroom. On blood thinner. History of seizures,  atrial fibrillation, diabetes, hypertension. EXAM: CT HEAD WITHOUT CONTRAST CT MAXILLOFACIAL WITHOUT CONTRAST CT CERVICAL SPINE WITHOUT CONTRAST TECHNIQUE: Multidetector CT imaging of the head, cervical spine, and maxillofacial structures were performed using the standard protocol without intravenous contrast. Multiplanar CT image reconstructions of the cervical spine and maxillofacial structures were also generated. COMPARISON:  CT HEAD Jan 14, 2017 and CT cervical spine Jan 13, 2017 FINDINGS: CT HEAD FINDINGS BRAIN: Subcentimeter hemorrhage LEFT sylvian fissure versus  posterior insula (axial 19/35). No mass effect, midline shift or acute large vascular territory infarcts. 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 Small area RIGHT frontal lobe encephalomalacia. Confluent supratentorial white matter hypodensities. Moderate ventriculomegaly on the basis of global parenchymal brain volume loss, stable. No abnormal extra-axial fluid collections. Basal cisterns are patent. VASCULAR: Moderate to severe calcific atherosclerosis of the carotid siphons and to lesser extent LEFT vertebral artery. SKULL: No skull fracture. No significant scalp soft tissue swelling. OTHER: None. CT MAXILLOFACIAL FINDINGS OSSEOUS: The mandible is intact, the condyles are located. No acute facial fracture. No destructive bony lesions. Patient is edentulous. ORBITS: Ocular globes and orbital contents are nonacute. Status post bilateral ocular lens implants. SINUSES: Mild paranasal sinus mucosal thickening. Nasal septum is midline. Included mastoid aircells are well aerated. SOFT TISSUES: RIGHT perioral soft tissue swelling and subcutaneous gas without radiopaque foreign bodies. CT CERVICAL SPINE FINDINGS ALIGNMENT: Maintained lordosis. Vertebral bodies in alignment. SKULL BASE AND VERTEBRAE: Acute Anteroinferior C6 endplate corner fracture. Moderate to severe C5-6 and C6-7 degenerative discs. No destructive bony lesions. C1-2 articulation maintained. Nuchal ligament calcifications present. SOFT TISSUES AND SPINAL CANAL: Nonacute. Mild calcific atherosclerosis carotid bifurcations. DISC LEVELS: No significant osseous canal stenosis. Severe RIGHT C5-6 and moderate bile lateral C6-7 neural foraminal narrowing. UPPER CHEST: Lung apices are clear. OTHER: None. IMPRESSION: CT HEAD: 1. Acute subcentimeter LEFT sylvian fissure subarachnoid hemorrhage versus insular hemorrhagic contusion. 2. Severe chronic small vessel ischemic disease. 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 RIGHT frontal lobe encephalomalacia. 3. Stable moderate  atrophy. CT MAXILLOFACIAL: 1. No acute facial fracture 2. Perioral soft tissue swelling and laceration. No radiopaque foreign bodies. CT CERVICAL SPINE: 1. Acute nondisplaced C6 Anteroinferior endplate fracture. No malalignment. Critical Value/emergent results were called by telephone at the time of interpretation on 05/08/2017 at 6:05 am to Dr. Azalia Bilis , who verbally acknowledged these results. Electronically Signed   By: Awilda Metro M.D.   On: 05/08/2017 06:08    Assessment/Plan: Principal Problem:   Intracranial hemorrhage following injury [facility]) Active Problems:   MENTAL RETARDATION   Essential hypertension   Diabetes mellitus type 2, controlled (HCC)   Atrial fibrillation (HCC)   Schizophrenia (HCC)   Traumatic closed fracture of C6 vertebra with minimal displacement, initial encounter (HCC)   Chronic anticoagulation   Hematoma of oral cavity    This patient was discussed with the ED physician, including pertinent vitals, physical exam findings, labs, and imaging.  We also discussed care given by the ED provider.  #1 intercranial hemorrhage following injury  Not surgical candidate.  Conservative treatments  Blood pressure management  At this point, will repeat CT tomorrow morning  Repeat INR tomorrow  If stable, consider return to nursing home tomorrow #2 traumatic closed fracture of C6, minimal displacement  Per neurosurgery, chip fracture of osteophyte  Soft collar #3 chronic anticoagulation  Reversed  Repeat INR tomorrow #4 hypertension  Continue blood pressure management #5 diabetes type 2  Continue home insulin and sliding scale  CBGs before meals and daily at bedtime #6 atrial fibrillation  Rate controlled  Continue amiodarone #7 schizophrenia  Continue Risperdal #8 oral contusion  Chlorhexidine rinse   DVT prophylaxis: SCDs Consultants: Neurosurgery Code Status: DO NOT RESUSCITATE Family Communication: Unavailable  Disposition  Plan: Return to [facility] following admission   Levie Heritage, [place] [facility] Pager [phone]  If 7PM-7AM, please contact night-coverage [email or web address] Password [email or web address]

## 2017-05-08 NOTE — Consult Note (Signed)
Reason for Consult: Traumatic subarachnoid hemorrhage Referring Physician: Emergency room  Jordan Rowe is an 71 y.o. male.  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 Patient currently is not complaining of headache and reports some midline neck pain. Apparently patient has had progressive and sequential decline in performance status from a cardiac standpoint to where he has very short life expectancy. Patient was brought to the ER and underwent CT scan which showed a small traumatic subarachnoid hemorrhage and a small chip anterior fracture of C6.  Past Medical History:  Diagnosis Date  . Atrial fibrillation with RVR (HCC)    [name] 09/30/2015  . BPH (benign prostatic hypertrophy)    [name] 09/30/2015  . Constipation   . Edema   . GERD (gastroesophageal reflux disease)   . Glaucoma   . Hypertension   . Mental retardation    [name] 09/30/2015  . Schizophrenia (HCC)   . Seizure (HCC)   . Type II diabetes mellitus (HCC)    [name] 09/30/2015  . Vitamin D deficiency     Past Surgical History:  Procedure Laterality Date  . CATARACT EXTRACTION Left 12/2002   [name] 01/12/2011  . CATARACT EXTRACTION W/ INTRAOCULAR LENS IMPLANT Right 01/2011   [name] 02/18/2011  . MULTIPLE TOOTH EXTRACTIONS      Family History  Problem Relation Age of Onset  . Hypertension Mother     Social History:  reports that he has been smoking.  He has never used smokeless tobacco. He reports that he does not drink alcohol or use drugs.  Allergies: No Known Allergies  Medications: I have reviewed the patient's current medications.  Results for orders placed or performed during the hospital encounter of 05/08/17 (from the past 48 hour(s))  Protime-INR     Status: None   Collection Time: 05/08/17  8:04 AM  Result Value Ref Range   Prothrombin Time 14.8 11.4 - 15.2 seconds   INR  1.17     Ct Head Wo Contrast  Result Date: 05/08/2017 CLINICAL DATA:  Larey Seat on way to bathroom. On blood thinner. History of seizures, atrial fibrillation, diabetes, hypertension. EXAM: CT HEAD WITHOUT CONTRAST CT MAXILLOFACIAL WITHOUT CONTRAST CT CERVICAL SPINE WITHOUT CONTRAST TECHNIQUE: Multidetector CT imaging of the head, cervical spine, and maxillofacial structures were performed using the standard protocol without intravenous contrast. Multiplanar CT image reconstructions of the cervical spine and maxillofacial structures were also generated. COMPARISON:  CT HEAD Jan 14, 2017 and CT cervical spine Jan 13, 2017 FINDINGS: CT HEAD FINDINGS BRAIN: Subcentimeter hemorrhage LEFT sylvian fissure versus posterior insula (axial 19/35). No mass effect, midline shift or acute large vascular territory infarcts. 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 Small area RIGHT frontal lobe encephalomalacia. Confluent supratentorial white matter hypodensities. Moderate ventriculomegaly on the basis of global parenchymal brain volume loss, stable. No abnormal extra-axial fluid collections. Basal cisterns are patent. VASCULAR: Moderate to severe calcific atherosclerosis of the carotid siphons and to lesser extent LEFT vertebral artery. SKULL: No skull fracture. No significant scalp soft tissue swelling. OTHER: None. CT MAXILLOFACIAL FINDINGS OSSEOUS: The mandible is intact, the condyles are located. No acute facial fracture. No destructive bony lesions. Patient is edentulous. ORBITS: Ocular globes and orbital contents are nonacute. Status post bilateral ocular lens implants. SINUSES: Mild paranasal sinus mucosal thickening. Nasal septum is midline. Included mastoid aircells are well aerated. SOFT TISSUES: RIGHT perioral soft tissue swelling and  subcutaneous gas without radiopaque foreign bodies. CT CERVICAL SPINE FINDINGS ALIGNMENT: Maintained lordosis. Vertebral bodies in alignment. SKULL BASE AND VERTEBRAE: Acute Anteroinferior C6  endplate corner fracture. Moderate to severe C5-6 and C6-7 degenerative discs. No destructive bony lesions. C1-2 articulation maintained. Nuchal ligament calcifications present. SOFT TISSUES AND SPINAL CANAL: Nonacute. Mild calcific atherosclerosis carotid bifurcations. DISC LEVELS: No significant osseous canal stenosis. Severe RIGHT C5-6 and moderate bile lateral C6-7 neural foraminal narrowing. UPPER CHEST: Lung apices are clear. OTHER: None. IMPRESSION: CT HEAD: 1. Acute subcentimeter LEFT sylvian fissure subarachnoid hemorrhage versus insular hemorrhagic contusion. 2. Severe chronic small vessel ischemic disease. 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 RIGHT frontal lobe encephalomalacia. 3. Stable moderate atrophy. CT MAXILLOFACIAL: 1. No acute facial fracture 2. Perioral soft tissue swelling and laceration. No radiopaque foreign bodies. CT CERVICAL SPINE: 1. Acute nondisplaced C6 Anteroinferior endplate fracture. No malalignment. Critical Value/emergent results were called by telephone at the time of interpretation on 05/08/2017 at 6:05 am to Dr. Azalia Bilis , who verbally acknowledged these results. Electronically Signed   By: Awilda Metro M.D.   On: 05/08/2017 06:08   Ct Cervical Spine Wo Contrast  Result Date: 05/08/2017 CLINICAL DATA:  Larey Seat on way to bathroom. On blood thinner. History of seizures, atrial fibrillation, diabetes, hypertension. EXAM: CT HEAD WITHOUT CONTRAST CT MAXILLOFACIAL WITHOUT CONTRAST CT CERVICAL SPINE WITHOUT CONTRAST TECHNIQUE: Multidetector CT imaging of the head, cervical spine, and maxillofacial structures were performed using the standard protocol without intravenous contrast. Multiplanar CT image reconstructions of the cervical spine and maxillofacial structures were also generated. COMPARISON:  CT HEAD Jan 14, 2017 and CT cervical spine Jan 13, 2017 FINDINGS: CT HEAD FINDINGS BRAIN: Subcentimeter hemorrhage LEFT sylvian fissure versus posterior insula (axial 19/35). No mass  effect, midline shift or acute large vascular territory infarcts. 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 Small area RIGHT frontal lobe encephalomalacia. Confluent supratentorial white matter hypodensities. Moderate ventriculomegaly on the basis of global parenchymal brain volume loss, stable. No abnormal extra-axial fluid collections. Basal cisterns are patent. VASCULAR: Moderate to severe calcific atherosclerosis of the carotid siphons and to lesser extent LEFT vertebral artery. SKULL: No skull fracture. No significant scalp soft tissue swelling. OTHER: None. CT MAXILLOFACIAL FINDINGS OSSEOUS: The mandible is intact, the condyles are located. No acute facial fracture. No destructive bony lesions. Patient is edentulous. ORBITS: Ocular globes and orbital contents are nonacute. Status post bilateral ocular lens implants. SINUSES: Mild paranasal sinus mucosal thickening. Nasal septum is midline. Included mastoid aircells are well aerated. SOFT TISSUES: RIGHT perioral soft tissue swelling and subcutaneous gas without radiopaque foreign bodies. CT CERVICAL SPINE FINDINGS ALIGNMENT: Maintained lordosis. Vertebral bodies in alignment. SKULL BASE AND VERTEBRAE: Acute Anteroinferior C6 endplate corner fracture. Moderate to severe C5-6 and C6-7 degenerative discs. No destructive bony lesions. C1-2 articulation maintained. Nuchal ligament calcifications present. SOFT TISSUES AND SPINAL CANAL: Nonacute. Mild calcific atherosclerosis carotid bifurcations. DISC LEVELS: No significant osseous canal stenosis. Severe RIGHT C5-6 and moderate bile lateral C6-7 neural foraminal narrowing. UPPER CHEST: Lung apices are clear. OTHER: None. IMPRESSION: CT HEAD: 1. Acute subcentimeter LEFT sylvian fissure subarachnoid hemorrhage versus insular hemorrhagic contusion. 2. Severe chronic small vessel ischemic disease. 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 RIGHT frontal lobe encephalomalacia. 3. Stable moderate atrophy. CT MAXILLOFACIAL: 1. No acute  facial fracture 2. Perioral soft tissue swelling and laceration. No radiopaque foreign bodies. CT CERVICAL SPINE: 1. Acute nondisplaced C6 Anteroinferior endplate fracture. No malalignment. Critical Value/emergent results were called by telephone at the time of interpretation on 05/08/2017 at  6:05 am to Dr. Azalia Bilis , who verbally acknowledged these results. Electronically Signed   By: Awilda Metro M.D.   On: 05/08/2017 06:08   Ct Maxillofacial Wo Contrast  Result Date: 05/08/2017 CLINICAL DATA:  Larey Seat on way to bathroom. On blood thinner. History of seizures, atrial fibrillation, diabetes, hypertension. EXAM: CT HEAD WITHOUT CONTRAST CT MAXILLOFACIAL WITHOUT CONTRAST CT CERVICAL SPINE WITHOUT CONTRAST TECHNIQUE: Multidetector CT imaging of the head, cervical spine, and maxillofacial structures were performed using the standard protocol without intravenous contrast. Multiplanar CT image reconstructions of the cervical spine and maxillofacial structures were also generated. COMPARISON:  CT HEAD Jan 14, 2017 and CT cervical spine Jan 13, 2017 FINDINGS: CT HEAD FINDINGS BRAIN: Subcentimeter hemorrhage LEFT sylvian fissure versus posterior insula (axial 19/35). No mass effect, midline shift or acute large vascular territory infarcts. 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 Small area RIGHT frontal lobe encephalomalacia. Confluent supratentorial white matter hypodensities. Moderate ventriculomegaly on the basis of global parenchymal brain volume loss, stable. No abnormal extra-axial fluid collections. Basal cisterns are patent. VASCULAR: Moderate to severe calcific atherosclerosis of the carotid siphons and to lesser extent LEFT vertebral artery. SKULL: No skull fracture. No significant scalp soft tissue swelling. OTHER: None. CT MAXILLOFACIAL FINDINGS OSSEOUS: The mandible is intact, the condyles are located. No acute facial fracture. No destructive bony lesions. Patient is edentulous. ORBITS: Ocular globes and  orbital contents are nonacute. Status post bilateral ocular lens implants. SINUSES: Mild paranasal sinus mucosal thickening. Nasal septum is midline. Included mastoid aircells are well aerated. SOFT TISSUES: RIGHT perioral soft tissue swelling and subcutaneous gas without radiopaque foreign bodies. CT CERVICAL SPINE FINDINGS ALIGNMENT: Maintained lordosis. Vertebral bodies in alignment. SKULL BASE AND VERTEBRAE: Acute Anteroinferior C6 endplate corner fracture. Moderate to severe C5-6 and C6-7 degenerative discs. No destructive bony lesions. C1-2 articulation maintained. Nuchal ligament calcifications present. SOFT TISSUES AND SPINAL CANAL: Nonacute. Mild calcific atherosclerosis carotid bifurcations. DISC LEVELS: No significant osseous canal stenosis. Severe RIGHT C5-6 and moderate bile lateral C6-7 neural foraminal narrowing. UPPER CHEST: Lung apices are clear. OTHER: None. IMPRESSION: CT HEAD: 1. Acute subcentimeter LEFT sylvian fissure subarachnoid hemorrhage versus insular hemorrhagic contusion. 2. Severe chronic small vessel ischemic disease. 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 RIGHT frontal lobe encephalomalacia. 3. Stable moderate atrophy. CT MAXILLOFACIAL: 1. No acute facial fracture 2. Perioral soft tissue swelling and laceration. No radiopaque foreign bodies. CT CERVICAL SPINE: 1. Acute nondisplaced C6 Anteroinferior endplate fracture. No malalignment. Critical Value/emergent results were called by telephone at the time of interpretation on 05/08/2017 at 6:05 am to Dr. Azalia Bilis , who verbally acknowledged these results. Electronically Signed   By: Awilda Metro M.D.   On: 05/08/2017 06:08    Review of Systems  Musculoskeletal: Positive for neck pain.   Blood pressure (!) 185/100, pulse 72, temperature 98.7 F (37.1 C), resp. rate 16, height  (1.753 m), weight 83.9 kg (185 lb), SpO2 100 %. Physical Exam  Neurological: He is alert. He has normal strength. GCS eye subscore is 4. GCS  verbal subscore is 5. GCS motor subscore is 6.  Patient is awake and alert pupils equal neck has moderate midline tenderness strength appears to be at least 4+ out of 5 upper and lower extremities symmetric and nonfocal reflexes are normal and symmetric    Assessment/Plan: 71 year old with very small to maximum and hemorrhage and chip fracture off the C6 at the site of an osteophyte. I do not think the neck fracture is unstable patient can be transitioned  to a soft collar which is only there for comfort as patient does have neck pain it's worthwhile utilizing some sort of orthosis but think a soft collar was adequate. Due to the patient's significant decline in performance status and cardiac perspective he is not a surgical candidate even if this hemorrhage were to extend. At this point I would recommend conservative treatment with consideration be given to not even rescanning his head. When patients medically stable it's okay from my perspective to be transferred back to the nursing home. No new neurosurgical recommendations.  Sarely Stracener P 05/08/2017, 9:23 AM

## 2017-05-08 NOTE — Progress Notes (Signed)
0851---[facility] RN visit  Pt currently in ER D 34, he is sleeping, left undisturbed as I will see him later after admission. Update from [place], [place].  Will continue to follow while in the hospital.  Please call for any hospice related questions or concerns.  Thank you,  Haynes Bast, RN, BSN [facility] Liaison [phone]

## 2017-05-08 NOTE — ED Provider Notes (Signed)
[facility]-EMERGENCY DEPT Provider Note   CSN: [id number] Arrival date & time: 05/08/17  0405     History   Chief Complaint Chief Complaint  Patient presents with  . Fall    HPI Jordan Rowe is a 71 y.o. male.  HPI Patient is brought to the emergency department after an unwitnessed fall at the nursing home today.  He called staff was found to be bleeding from his face and swollen than his lip was sent here for further evaluation.  He is on Eliquis for atrial fibrillation.  He reports mild headache at this time.  Denies weakness in his arms and legs.  Denies hip pain.  Past Medical History:  Diagnosis Date  . Atrial fibrillation with RVR (HCC)    [name] 09/30/2015  . BPH (benign prostatic hypertrophy)    [name] 09/30/2015  . Constipation   . Edema   . GERD (gastroesophageal reflux disease)   . Glaucoma   . Hypertension   . Mental retardation    [name] 09/30/2015  . Schizophrenia (HCC)   . Seizure (HCC)   . Type II diabetes mellitus (HCC)    [name] 09/30/2015  . Vitamin D deficiency     Patient Active Problem List   Diagnosis Date Noted  . Acute ischemic stroke (HCC)   . Fall   . AKI (acute kidney injury) (HCC)   . Lactic acidosis   . Stroke-like symptom 01/13/2017  . NSVT (nonsustained ventricular tachycardia) (HCC) 10/05/2015  . Cardiomyopathy (HCC)   . Smoker 10/01/2015  . Schizophrenia (HCC)   . New onset a-fib (HCC) 09/30/2015  . Atrial fibrillation with RVR (HCC) 09/30/2015  . Chest pain 09/30/2015  . Diabetes mellitus type 2, controlled (HCC) 09/30/2015  . Atrial fibrillation (HCC) 09/30/2015  . Pain in the chest   . 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 Diabetes mellitus (HCC) 02/02/2013  . Dehydration 02/02/2013  . Acute renal failure (HCC) 02/02/2013  . Hypoglycemia 02/02/2013  . MENTAL RETARDATION 02/12/2009  . Essential hypertension 02/12/2009  . GERD 02/12/2009  . CONSTIPATION 02/12/2009  . CHEST PAIN 02/12/2009  . NAUSEA AND VOMITING 02/12/2009    Past  Surgical History:  Procedure Laterality Date  . CATARACT EXTRACTION Left 12/2002   [name] 01/12/2011  . CATARACT EXTRACTION W/ INTRAOCULAR LENS IMPLANT Right 01/2011   [name] 02/18/2011  . MULTIPLE TOOTH EXTRACTIONS         Home Medications    Prior to Admission medications   Medication Sig Start Date End Date Taking? Authorizing Provider  acetaminophen (TYLENOL) 325 MG tablet Take 325-650 mg by mouth every 6 (six) hours as needed for mild pain or moderate pain.     [provider]  amiodarone (PACERONE) 200 MG tablet Take 1 tablet (200 mg total) by mouth daily. 10/30/15   Newman Nip, NP  apixaban (ELIQUIS) 5 MG TABS tablet Take 1 tablet (5 mg total) by mouth 2 (two) times daily. 10/07/15   Christiane Ha, MD  cholecalciferol (VITAMIN D) 1000 UNITS tablet Take 1,000 Units by mouth every morning.     [provider]  docusate sodium (COLACE) 100 MG capsule Take 100 mg by mouth 2 (two) times daily.    [provider]  finasteride (PROSCAR) 5 MG tablet Take 5 mg by mouth every evening.     [provider]  furosemide (LASIX) 20 MG tablet Take 20 mg by mouth daily.    [provider]  insulin glargine (LANTUS) 100 UNIT/ML injection Inject 0.05 mLs (  5 Units total) into the skin at bedtime. Hold is FSBS is less than 75 Patient taking differently: Inject 8 Units into the skin at bedtime. Hold is FSBS is less than 75 10/07/15   Christiane Ha, MD  latanoprost (XALATAN) 0.005 % ophthalmic solution Place 1 drop into both eyes at bedtime.    [provider]  omeprazole (PRILOSEC) 20 MG capsule Take 40 mg by mouth every evening. 30 min prior to evening meal.    [provider]  Polyethyl Glycol-Propyl Glycol (SYSTANE OP) Place 1 drop into both eyes 4 (four) times daily.    [provider]  Polyvinyl Alcohol-Povidone (REFRESH OP) Place 1 drop into both eyes 2 (two) times daily.    [provider]  risperiDONE  (RISPERDAL) 0.25 MG tablet Take 0.25 mg by mouth at bedtime.    [provider]  risperiDONE (RISPERDAL) 2 MG tablet Take 2 mg by mouth at bedtime.    [provider]  senna (SENOKOT) 8.6 MG TABS Take 1-2 tablets by mouth See admin instructions. 1 tab in am, 2 tabs in pm    [provider]  tamsulosin (FLOMAX) 0.4 MG CAPS capsule Take 1 capsule (0.4 mg total) by mouth daily. 10/22/16   Trixie Dredge, PA-C  timolol (BETIMOL) 0.5 % ophthalmic solution Place 1 drop into both eyes daily.    [provider]    Family History Family History  Problem Relation Age of Onset  . Hypertension Mother     Social History Social History  Substance Use Topics  . Smoking status: Current Every Day Smoker  . Smokeless tobacco: Never Used  . Alcohol use No     Allergies   Patient has no known allergies.   Review of Systems Review of Systems  All other systems reviewed and are negative.    Physical Exam Updated Vital Signs BP (!) 178/92   Pulse 60   Temp 98.1 F (36.7 C) (Oral)   Resp 18   Ht  (1.753 m)   Wt 83.9 kg (185 lb)   SpO2 100%   BMI 27.32 kg/m   Physical Exam  Constitutional: He appears well-developed and well-nourished.  HENT:  Swelling of the right upper lobe with a small punctate laceration externally of the right upper lip.  This does not involve the vermilion border.  Patient has a moderate size intraoral laceration without active bleeding at this time of the right upper lip.  Patient is a edentulous  Eyes: EOM are normal.  Neck: Normal range of motion.  Cardiovascular: Normal rate.   Pulmonary/Chest: Effort normal and breath sounds normal.  Abdominal: He exhibits no distension. There is no tenderness.  Musculoskeletal: Normal range of motion.  Full range of motion bilateral hips, knees, ankles.  Full range of motion bilateral shoulders, elbows, wrists.  Normal grip strength bilaterally.  Neurological: He is alert.  Psychiatric:  He has a normal mood and affect.  Nursing note and vitals reviewed.    ED Treatments / Results  Labs (all labs ordered are listed, but only abnormal results are displayed) Labs Reviewed - No data to display  EKG  EKG Interpretation None       Radiology Ct Head Wo Contrast  Result Date: 05/08/2017 CLINICAL DATA:  Larey Seat on way to bathroom. On blood thinner. History of seizures, atrial fibrillation, diabetes, hypertension. EXAM: CT HEAD WITHOUT CONTRAST CT MAXILLOFACIAL WITHOUT CONTRAST CT CERVICAL SPINE WITHOUT CONTRAST TECHNIQUE: Multidetector CT imaging of the head, cervical spine,  and maxillofacial structures were performed using the standard protocol without intravenous contrast. Multiplanar CT image reconstructions of the cervical spine and maxillofacial structures were also generated. COMPARISON:  CT HEAD Jan 14, 2017 and CT cervical spine Jan 13, 2017 FINDINGS: CT HEAD FINDINGS BRAIN: Subcentimeter hemorrhage LEFT sylvian fissure versus posterior insula (axial 19/35). No mass effect, midline shift or acute large vascular territory infarcts. 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 Small area RIGHT frontal lobe encephalomalacia. Confluent supratentorial white matter hypodensities. Moderate ventriculomegaly on the basis of global parenchymal brain volume loss, stable. No abnormal extra-axial fluid collections. Basal cisterns are patent. VASCULAR: Moderate to severe calcific atherosclerosis of the carotid siphons and to lesser extent LEFT vertebral artery. SKULL: No skull fracture. No significant scalp soft tissue swelling. OTHER: None. CT MAXILLOFACIAL FINDINGS OSSEOUS: The mandible is intact, the condyles are located. No acute facial fracture. No destructive bony lesions. Patient is edentulous. ORBITS: Ocular globes and orbital contents are nonacute. Status post bilateral ocular lens implants. SINUSES: Mild paranasal sinus mucosal thickening. Nasal septum is midline. Included mastoid aircells are  well aerated. SOFT TISSUES: RIGHT perioral soft tissue swelling and subcutaneous gas without radiopaque foreign bodies. CT CERVICAL SPINE FINDINGS ALIGNMENT: Maintained lordosis. Vertebral bodies in alignment. SKULL BASE AND VERTEBRAE: Acute Anteroinferior C6 endplate corner fracture. Moderate to severe C5-6 and C6-7 degenerative discs. No destructive bony lesions. C1-2 articulation maintained. Nuchal ligament calcifications present. SOFT TISSUES AND SPINAL CANAL: Nonacute. Mild calcific atherosclerosis carotid bifurcations. DISC LEVELS: No significant osseous canal stenosis. Severe RIGHT C5-6 and moderate bile lateral C6-7 neural foraminal narrowing. UPPER CHEST: Lung apices are clear. OTHER: None. IMPRESSION: CT HEAD: 1. Acute subcentimeter LEFT sylvian fissure subarachnoid hemorrhage versus insular hemorrhagic contusion. 2. Severe chronic small vessel ischemic disease. 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 RIGHT frontal lobe encephalomalacia. 3. Stable moderate atrophy. CT MAXILLOFACIAL: 1. No acute facial fracture 2. Perioral soft tissue swelling and laceration. No radiopaque foreign bodies. CT CERVICAL SPINE: 1. Acute nondisplaced C6 Anteroinferior endplate fracture. No malalignment. Critical Value/emergent results were called by telephone at the time of interpretation on 05/08/2017 at 6:05 am to Dr. Azalia Bilis , who verbally acknowledged these results. Electronically Signed   By: Awilda Metro M.D.   On: 05/08/2017 06:08   Ct Cervical Spine Wo Contrast  Result Date: 05/08/2017 CLINICAL DATA:  Larey Seat on way to bathroom. On blood thinner. History of seizures, atrial fibrillation, diabetes, hypertension. EXAM: CT HEAD WITHOUT CONTRAST CT MAXILLOFACIAL WITHOUT CONTRAST CT CERVICAL SPINE WITHOUT CONTRAST TECHNIQUE: Multidetector CT imaging of the head, cervical spine, and maxillofacial structures were performed using the standard protocol without intravenous contrast. Multiplanar CT image reconstructions of the  cervical spine and maxillofacial structures were also generated. COMPARISON:  CT HEAD Jan 14, 2017 and CT cervical spine Jan 13, 2017 FINDINGS: CT HEAD FINDINGS BRAIN: Subcentimeter hemorrhage LEFT sylvian fissure versus posterior insula (axial 19/35). No mass effect, midline shift or acute large vascular territory infarcts. 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 Small area RIGHT frontal lobe encephalomalacia. Confluent supratentorial white matter hypodensities. Moderate ventriculomegaly on the basis of global parenchymal brain volume loss, stable. No abnormal extra-axial fluid collections. Basal cisterns are patent. VASCULAR: Moderate to severe calcific atherosclerosis of the carotid siphons and to lesser extent LEFT vertebral artery. SKULL: No skull fracture. No significant scalp soft tissue swelling. OTHER: None. CT MAXILLOFACIAL FINDINGS OSSEOUS: The mandible is intact, the condyles are located. No acute facial fracture. No destructive bony lesions. Patient is edentulous. ORBITS: Ocular globes and orbital contents are nonacute. Status  post bilateral ocular lens implants. SINUSES: Mild paranasal sinus mucosal thickening. Nasal septum is midline. Included mastoid aircells are well aerated. SOFT TISSUES: RIGHT perioral soft tissue swelling and subcutaneous gas without radiopaque foreign bodies. CT CERVICAL SPINE FINDINGS ALIGNMENT: Maintained lordosis. Vertebral bodies in alignment. SKULL BASE AND VERTEBRAE: Acute Anteroinferior C6 endplate corner fracture. Moderate to severe C5-6 and C6-7 degenerative discs. No destructive bony lesions. C1-2 articulation maintained. Nuchal ligament calcifications present. SOFT TISSUES AND SPINAL CANAL: Nonacute. Mild calcific atherosclerosis carotid bifurcations. DISC LEVELS: No significant osseous canal stenosis. Severe RIGHT C5-6 and moderate bile lateral C6-7 neural foraminal narrowing. UPPER CHEST: Lung apices are clear. OTHER: None. IMPRESSION: CT HEAD: 1. Acute subcentimeter  LEFT sylvian fissure subarachnoid hemorrhage versus insular hemorrhagic contusion. 2. Severe chronic small vessel ischemic disease. 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 RIGHT frontal lobe encephalomalacia. 3. Stable moderate atrophy. CT MAXILLOFACIAL: 1. No acute facial fracture 2. Perioral soft tissue swelling and laceration. No radiopaque foreign bodies. CT CERVICAL SPINE: 1. Acute nondisplaced C6 Anteroinferior endplate fracture. No malalignment. Critical Value/emergent results were called by telephone at the time of interpretation on 05/08/2017 at 6:05 am to Dr. Azalia Bilis , who verbally acknowledged these results. Electronically Signed   By: Awilda Metro M.D.   On: 05/08/2017 06:08   Ct Maxillofacial Wo Contrast  Result Date: 05/08/2017 CLINICAL DATA:  Larey Seat on way to bathroom. On blood thinner. History of seizures, atrial fibrillation, diabetes, hypertension. EXAM: CT HEAD WITHOUT CONTRAST CT MAXILLOFACIAL WITHOUT CONTRAST CT CERVICAL SPINE WITHOUT CONTRAST TECHNIQUE: Multidetector CT imaging of the head, cervical spine, and maxillofacial structures were performed using the standard protocol without intravenous contrast. Multiplanar CT image reconstructions of the cervical spine and maxillofacial structures were also generated. COMPARISON:  CT HEAD Jan 14, 2017 and CT cervical spine Jan 13, 2017 FINDINGS: CT HEAD FINDINGS BRAIN: Subcentimeter hemorrhage LEFT sylvian fissure versus posterior insula (axial 19/35). No mass effect, midline shift or acute large vascular territory infarcts. 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 Small area RIGHT frontal lobe encephalomalacia. Confluent supratentorial white matter hypodensities. Moderate ventriculomegaly on the basis of global parenchymal brain volume loss, stable. No abnormal extra-axial fluid collections. Basal cisterns are patent. VASCULAR: Moderate to severe calcific atherosclerosis of the carotid siphons and to lesser extent LEFT vertebral artery. SKULL: No  skull fracture. No significant scalp soft tissue swelling. OTHER: None. CT MAXILLOFACIAL FINDINGS OSSEOUS: The mandible is intact, the condyles are located. No acute facial fracture. No destructive bony lesions. Patient is edentulous. ORBITS: Ocular globes and orbital contents are nonacute. Status post bilateral ocular lens implants. SINUSES: Mild paranasal sinus mucosal thickening. Nasal septum is midline. Included mastoid aircells are well aerated. SOFT TISSUES: RIGHT perioral soft tissue swelling and subcutaneous gas without radiopaque foreign bodies. CT CERVICAL SPINE FINDINGS ALIGNMENT: Maintained lordosis. Vertebral bodies in alignment. SKULL BASE AND VERTEBRAE: Acute Anteroinferior C6 endplate corner fracture. Moderate to severe C5-6 and C6-7 degenerative discs. No destructive bony lesions. C1-2 articulation maintained. Nuchal ligament calcifications present. SOFT TISSUES AND SPINAL CANAL: Nonacute. Mild calcific atherosclerosis carotid bifurcations. DISC LEVELS: No significant osseous canal stenosis. Severe RIGHT C5-6 and moderate bile lateral C6-7 neural foraminal narrowing. UPPER CHEST: Lung apices are clear. OTHER: None. IMPRESSION: CT HEAD: 1. Acute subcentimeter LEFT sylvian fissure subarachnoid hemorrhage versus insular hemorrhagic contusion. 2. Severe chronic small vessel ischemic disease. 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 RIGHT frontal lobe encephalomalacia. 3. Stable moderate atrophy. CT MAXILLOFACIAL: 1. No acute facial fracture 2. Perioral soft tissue swelling and laceration. No radiopaque foreign bodies.  CT CERVICAL SPINE: 1. Acute nondisplaced C6 Anteroinferior endplate fracture. No malalignment. Critical Value/emergent results were called by telephone at the time of interpretation on 05/08/2017 at 6:05 am to Dr. Azalia Bilis , who verbally acknowledged these results. Electronically Signed   By: Awilda Metro M.D.   On: 05/08/2017 06:08    Procedures .Critical Care Performed by: Azalia Bilis Authorized by: Azalia Bilis     Total critical care time: 33 minutes Critical care time was exclusive of separately billable procedures and treating other patients. Critical care was necessary to treat or prevent imminent or life-threatening deterioration. Critical care was time spent personally by me on the following activities: development of treatment plan with patient and/or surrogate as well as nursing, discussions with consultants, evaluation of patient's response to treatment, examination of patient, obtaining history from patient or surrogate, ordering and performing treatments and interventions, ordering and review of laboratory studies, ordering and review of radiographic studies, pulse oximetry and re-evaluation of patient's condition.   Medications Ordered in ED Medications  prothrombin complex conc human (KCENTRA) IVPB 4,404 Units (4,404 Units Intravenous New Bag/Given 05/08/17 0802)     Initial Impression / Assessment and Plan / ED Course  I have reviewed the triage vital signs and the nursing notes.  Pertinent labs & imaging results that were available during my care of the patient were reviewed by me and considered in my medical decision making (see chart for details).     Patient with small punctate traumatic intracranial hemorrhage.  Given his use of anticoagulants and his family's desire for ongoing care we will reverse the patient's anticoagulation.  Aspen collar for C6 fracture.  He may benefit from MRI done evaluate for anterior ligamentous injury.  I discussed the case with Dr. Wynetta Emery of neurosurgery who will see the patient consultation.  Patient will be admitted to the triad hospitalist service.  No active bleeding of the intraoral lesion.  He will likely benefit from a chlorhexidine rinse while inpatient.   Final Clinical Impressions(s) / ED Diagnoses   Final diagnoses:  Intracranial bleed (HCC)  Closed nondisplaced fracture of sixth cervical vertebra,  unspecified fracture morphology, initial encounter [facility])    New Prescriptions New Prescriptions   No medications on file     Azalia Bilis, MD 05/08/17 [phone]

## 2017-05-08 NOTE — ED Notes (Signed)
RN called [facility] on behalf of patient

## 2017-05-08 NOTE — ED Notes (Signed)
RN paged [facility] again as I did not hear back the first time

## 2017-05-09 DIAGNOSIS — E118 Type 2 diabetes mellitus with unspecified complications: Secondary | ICD-10-CM

## 2017-05-09 DIAGNOSIS — S00532A Contusion of oral cavity, initial encounter: Secondary | ICD-10-CM | POA: Diagnosis not present

## 2017-05-09 DIAGNOSIS — I5032 Chronic diastolic (congestive) heart failure: Secondary | ICD-10-CM | POA: Diagnosis not present

## 2017-05-09 DIAGNOSIS — I10 Essential (primary) hypertension: Secondary | ICD-10-CM | POA: Diagnosis not present

## 2017-05-09 DIAGNOSIS — N400 Enlarged prostate without lower urinary tract symptoms: Secondary | ICD-10-CM | POA: Diagnosis not present

## 2017-05-09 DIAGNOSIS — I480 Paroxysmal atrial fibrillation: Secondary | ICD-10-CM | POA: Diagnosis not present

## 2017-05-09 DIAGNOSIS — I609 Nontraumatic subarachnoid hemorrhage, unspecified: Secondary | ICD-10-CM

## 2017-05-09 DIAGNOSIS — S12501A Unspecified nondisplaced fracture of sixth cervical vertebra, initial encounter for closed fracture: Secondary | ICD-10-CM | POA: Diagnosis not present

## 2017-05-09 DIAGNOSIS — Z794 Long term (current) use of insulin: Secondary | ICD-10-CM | POA: Diagnosis not present

## 2017-05-09 LAB — CBC
HCT: 29.5 % — ABNORMAL LOW (ref 39.0–52.0)
HEMATOCRIT: 43.1 % (ref 39.0–52.0)
HEMOGLOBIN: 8.9 g/dL — AB (ref 13.0–17.0)
Hemoglobin: 14.5 g/dL (ref 13.0–17.0)
MCH: 27.3 pg (ref 26.0–34.0)
MCH: 30.9 pg (ref 26.0–34.0)
MCHC: 30.2 g/dL (ref 30.0–36.0)
MCHC: 33.6 g/dL (ref 30.0–36.0)
MCV: 90.5 fL (ref 78.0–100.0)
MCV: 91.7 fL (ref 78.0–100.0)
PLATELETS: 171 10*3/uL (ref 150–400)
Platelets: 140 10*3/uL — ABNORMAL LOW (ref 150–400)
RBC: 3.26 MIL/uL — AB (ref 4.22–5.81)
RBC: 4.7 MIL/uL (ref 4.22–5.81)
RDW: 16.6 % — ABNORMAL HIGH (ref 11.5–15.5)
RDW: 13.4 % (ref 11.5–15.5)
WBC: 7.8 10*3/uL (ref 4.0–10.5)
WBC: 8.4 10*3/uL (ref 4.0–10.5)

## 2017-05-09 LAB — PROTIME-INR
INR: 1.12
PROTHROMBIN TIME: 14.3 s (ref 11.4–15.2)

## 2017-05-09 LAB — BASIC METABOLIC PANEL
Anion gap: 9 (ref 5–15)
BUN: 16 mg/dL (ref 6–20)
CHLORIDE: 103 mmol/L (ref 101–111)
CO2: 28 mmol/L (ref 22–32)
Calcium: 8.8 mg/dL — ABNORMAL LOW (ref 8.9–10.3)
Creatinine, Ser: 1.17 mg/dL (ref 0.61–1.24)
GFR calc non Af Amer: >60 mL/min (ref >60)
Glucose, Bld: 168 mg/dL — ABNORMAL HIGH (ref 65–99)
POTASSIUM: 3.5 mmol/L (ref 3.5–5.1)
SODIUM: 140 mmol/L (ref 135–145)

## 2017-05-09 LAB — GLUCOSE, CAPILLARY
GLUCOSE-CAPILLARY: 274 mg/dL — AB (ref 65–99)
Glucose-Capillary: 157 mg/dL — ABNORMAL HIGH (ref 65–99)

## 2017-05-09 NOTE — Progress Notes (Signed)
09:00---[facility] RN visit  This is a related and covered GIP admission of 05/08/2017 with [facility] diagnosis of  Heart disease per Dr. Smith Mince. Patient has an OOF DNR.  EMS was activated after patient experineced an unwitnessed fall. Hospice was notified.  Patient was admitted to the for acute left sylvian fissure subarachnoid hemorrhage and acute nondisplaced C6 anterior-inferior endplate fracture that neurosurgery believes is a chip of an osteophyte.   Visited patient at bedside. Patient was asleep and appeared in no distress. VS WNL. Patient has a cervical collar on.  IV Saline lock Right Antecubital.   Medications:  No continuous meds or IV fluids. PRN Meds: Tylenol 650mg  PO Q4hrs, Dulcolax 10mg  Suppository, Apresoline 10mg  IV Q2hrs PRN SBP> 180, Norco 5-325mg  PO Q4hrs, Dilaudid 0.5mg  IV Q2hrs, Zofran 4mg  PO QID or 4mg  IV QID. No PRNs given today.   Plan is for patient to discharge back to his facility today.   Will continue to follow and anticipate any discharge needs.   Thank you.  Elsie Saas RN [facility] Liaison  [phone]   All Hospital Liaisons are on AMION

## 2017-05-09 NOTE — Care Management Obs Status (Signed)
MEDICARE OBSERVATION STATUS NOTIFICATION   Patient Details  Name: Jordan Rowe MRN: [id number] Date of Birth: 1946-01-13   Medicare Observation Status Notification Given:  Yes    Cherylann Parr, RN 05/09/2017, 12:12 PM

## 2017-05-09 NOTE — Evaluation (Addendum)
Occupational Therapy Evaluation Patient Details Name: Jordan Rowe MRN: [id number] DOB: [birth year] Today's Date: 05/09/2017    History of Present Illness Jordan Rowe is a 71 y.o male found down in his room following a mechanical fall with blood on his face. Pt found to have intercranial hemorrhage and closed fracture of C6 vertebrae both currently being conservatively treatedPrior medical history of GERD, hypertension, cognitive decline, BPH, atrial fibrillation rate controlled, diabetes type 2 on insulin, schizophrenia, history of stroke with mild residual left-sided deficits.   Clinical Impression   PTA, pt was living at ALF and had some assistance with ADL participation per his report. Of note, he is followed by hospice at his ALF. Pt currently requires min assist +2 for safety with toilet transfers and mod assist for LB ADL. Pt with minimal responses to questions this session but able to follow one-step commands throughout. Feel pt would benefit from continued OT services while admitted to improve independence and safety with ADL and functional mobility prior to returning to ALF with hospice services.      Follow Up Recommendations  Supervision/Assistance - 24 hour;Other (comment) (Return to ALF with hospice services)    Equipment Recommendations  None recommended by OT    Recommendations for Other Services       Precautions / Restrictions Precautions Precautions: Fall Restrictions Weight Bearing Restrictions: No      Mobility Bed Mobility Overal bed mobility: Needs Assistance Bed Mobility: Supine to Sit     Supine to sit: Min assist     General bed mobility comments: minA to aid in bring hips to EoB, vc for reaching across to bedrail to pull into seated  Transfers Overall transfer level: Needs assistance Equipment used: Rolling walker (2 wheeled) Transfers: Sit to/from Stand Sit to Stand: Min assist         General transfer comment: minA for powerup and  steadying in standing, vc for hand placement for powerup    Balance Overall balance assessment: History of Falls;Needs assistance Sitting-balance support: Bilateral upper extremity supported;Feet supported Sitting balance-Leahy Scale: Fair     Standing balance support: Bilateral upper extremity supported Standing balance-Leahy Scale: Poor Standing balance comment: Reliant on B UE support.                            ADL either performed or assessed with clinical judgement   ADL Overall ADL's : Needs assistance/impaired Eating/Feeding: Sitting   Grooming: Minimal assistance;Sitting   Upper Body Bathing: Minimal assistance;Sitting   Lower Body Bathing: Moderate assistance;Sit to/from stand   Upper Body Dressing : Minimal assistance;Sitting   Lower Body Dressing: Moderate assistance;Sit to/from stand   Toilet Transfer: Minimal assistance;+2 for safety/equipment;RW Toilet Transfer Details (indicate cue type and reason): Simulated in room with sit<>stand followd by ambulation.  Toileting- Clothing Manipulation and Hygiene: Moderate assistance;Sit to/from stand       Functional mobility during ADLs: Minimal assistance;+2 for safety/equipment;Rolling walker General ADL Comments: Pt with minimal responses to questions at times. Able to participate with ADL but unsafe without assist.      Vision   Vision Assessment?: Yes Eye Alignment: Within Functional Limits Ocular Range of Motion: Within Functional Limits Alignment/Gaze Preference: Within Defined Limits Tracking/Visual Pursuits: Decreased smoothness of horizontal tracking;Decreased smoothness of vertical tracking Additional Comments: Decreased smoothness of tracking in all directions. Noted bloodshot R eye.      Perception     Praxis      Pertinent Vitals/Pain Pain  Assessment: Faces Faces Pain Scale: Hurts a little bit Pain Location: headache Pain Descriptors / Indicators: Headache Pain Intervention(s):  Limited activity within patient's tolerance;Monitored during session     Hand Dominance Right   Extremity/Trunk Assessment Upper Extremity Assessment Upper Extremity Assessment: LUE deficits/detail;RUE deficits/detail RUE Deficits / Details: Generally weak. 4/5 grossly.  LUE Deficits / Details: History of L sided deficits from previous CVA. L UE with grossly 3/5 strength and weaker than R.    Lower Extremity Assessment Lower Extremity Assessment: Defer to PT evaluation RLE Deficits / Details: strength grossly +3/5 LLE Deficits / Details: limited by previous stroke, decreased dorsiflexion, and strength grossly 3/5 LLE Coordination: decreased fine motor       Communication Communication Communication:  (limited and repetitive answers. "Oh yeah" to nearly every question. )   Cognition Arousal/Alertness: Awake/alert;Lethargic Behavior During Therapy: WFL for tasks assessed/performed Overall Cognitive Status: History of cognitive impairments - at baseline                                 General Comments: Limited answers to questions and inconsistent responses. Pt able to follow one-step commands. Lethargic at times.    General Comments  Throughout mobility VSS. HR decreased to 52 bpm when seated in chair following ambulation in hallway.     Exercises     Shoulder Instructions      Home Living Family/patient expects to be discharged to:: Assisted living Living Arrangements:  (ALF facility)                           Home Equipment: Dan Humphreys - 2 wheels          Prior Functioning/Environment Level of Independence: Needs assistance  Gait / Transfers Assistance Needed: Used RW in the facility ADL's / Homemaking Assistance Needed: Pt reports that he has assitance getting dressed on initial questioning but later reporting that he can do most of this himself. Reports that he enjoys group outings to [place].             OT Problem List: Decreased  strength;Decreased range of motion;Decreased activity tolerance;Impaired balance (sitting and/or standing);Decreased cognition;Decreased safety awareness;Decreased knowledge of use of DME or AE;Decreased knowledge of precautions;Pain      OT Treatment/Interventions: Self-care/ADL training;Therapeutic exercise;Neuromuscular education;Energy conservation;Patient/family education;Therapeutic activities;Cognitive remediation/compensation    OT Goals(Current goals can be found in the care plan section) Acute Rehab OT Goals Patient Stated Goal: go home OT Goal Formulation: With patient Time For Goal Achievement: 05/23/17 Potential to Achieve Goals: Good  OT Frequency: Min 2X/week   Barriers to D/C:            Co-evaluation PT/OT/SLP Co-Evaluation/Treatment: Yes Reason for Co-Treatment: Necessary to address cognition/behavior during functional activity PT goals addressed during session: Mobility/safety with mobility;Balance OT goals addressed during session: ADL's and self-care      AM-PAC PT "6 Clicks" Daily Activity     Outcome Measure Help from another person eating meals?: A Little Help from another person taking care of personal grooming?: A Little Help from another person toileting, which includes using toliet, bedpan, or urinal?: A Lot Help from another person bathing (including washing, rinsing, drying)?: A Lot Help from another person to put on and taking off regular upper body clothing?: A Little Help from another person to put on and taking off regular lower body clothing?: A Lot 6 Click Score: 15  End of Session Equipment Utilized During Treatment: Gait belt;Rolling walker Nurse Communication: Mobility status (low HR)  Activity Tolerance: Patient tolerated treatment well Patient left: in chair;with chair alarm set;with call bell/phone within reach  OT Visit Diagnosis: Unsteadiness on feet (R26.81);Other abnormalities of gait and mobility (R26.89);Muscle weakness  (generalized) (M62.81);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)                Time: 5784-6962 OT Time Calculation (min): 25 min Charges:  OT General Charges $OT Visit: 1 Visit OT Evaluation $OT Eval Moderate Complexity: 1 Mod G-Codes: OT G-codes **NOT FOR INPATIENT CLASS** Functional Assessment Tool Used: Clinical judgement Functional Limitation: Self care Self Care Current Status (X5284): At least 40 percent but less than 60 percent impaired, limited or restricted Self Care Goal Status (X3244): At least 1 percent but less than 20 percent impaired, limited or restricted   Doristine Section, MS OTR/L  Pager: [phone]   Zerline Melchior A Rama Mcclintock 05/09/2017, 2:29 PM

## 2017-05-09 NOTE — NC FL2 (Signed)
[place] MEDICAID FL2 LEVEL OF CARE SCREENING TOOL     IDENTIFICATION  Patient Name: Jordan Rowe Birthdate: [birth year] Sex: male Admission Date (Current Location): 05/08/2017  [facility] and [place] Number:  Producer, television/film/video and Address:  [facility] [place], [address], [place], [place] [address]      Provider Number: [id number]  Attending Physician Name and Address:  Calvert Cantor, MD  Relative Name and Phone Number:       Current Level of Care: Hospital Recommended Level of Care: Assisted Living Facility (with hospice through [facility]) Prior Approval Number:    Date Approved/Denied:   PASRR Number:    Discharge Plan: Other (Comment) (ALF with hospice through [facility])    Current Diagnoses: Patient Active Problem List   Diagnosis Date Noted  . Chronic diastolic CHF (congestive heart failure) (HCC) 05/09/2017  . BPH (benign prostatic hyperplasia) 05/09/2017  . Subarachnoid bleed (HCC) 05/09/2017  . Traumatic closed fracture of C6 vertebra with minimal displacement, initial encounter (HCC) 05/08/2017  . Chronic anticoagulation 05/08/2017  . Hematoma of oral cavity 05/08/2017  . Fall   . Stroke-like symptom 01/13/2017  . NSVT (nonsustained ventricular tachycardia) (HCC) 10/05/2015  . Cardiomyopathy (HCC)   . Smoker 10/01/2015  . Schizophrenia (HCC)   . Diabetes mellitus type 2, controlled (HCC) 09/30/2015  . Atrial fibrillation (HCC) 09/30/2015  . Pain in the chest   . 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 Diabetes mellitus (HCC) 02/02/2013  . Dehydration 02/02/2013  . MENTAL RETARDATION 02/12/2009  . Essential hypertension 02/12/2009  . GERD 02/12/2009  . CONSTIPATION 02/12/2009    Orientation RESPIRATION BLADDER Height & Weight     Self, Time, Situation, Place  Normal Continent Weight: 169 lb 12.1 oz (77 kg) Height:   (175.3 cm)  BEHAVIORAL SYMPTOMS/MOOD NEUROLOGICAL BOWEL NUTRITION STATUS   (None)  (None) Continent Diet (Low sodium heart  healthy)  AMBULATORY STATUS COMMUNICATION OF NEEDS Skin     Verbally Skin abrasions                       Personal Care Assistance Level of Assistance              Functional Limitations Info  Sight, Hearing, Speech Sight Info: Adequate Hearing Info: Adequate Speech Info: Adequate    SPECIAL CARE FACTORS FREQUENCY  Blood pressure                    Contractures Contractures Info: Not present    Additional Factors Info  Code Status, Allergies, Psychotropic Code Status Info: DNR Allergies Info: NKDA Psychotropic Info: Schizophrenia: Risperdal 0.25 mg PO QAM, Risperdal 2 mg PO QHS         Current Medications (05/09/2017):  This is the current hospital active medication list Current Facility-Administered Medications  Medication Dose Route Frequency Provider Last Rate Last Dose  . acetaminophen (TYLENOL) tablet 650 mg  650 mg Oral Q4H PRN Levie Heritage, DO      . amiodarone (PACERONE) tablet 200 mg  200 mg Oral Daily Levie Heritage, DO   200 mg at 05/09/17 8119  . artificial tears (LACRILUBE) ophthalmic ointment   Both Eyes QID Levie Heritage, DO   1 application at 05/09/17 1478  . bisacodyl (DULCOLAX) suppository 10 mg  10 mg Rectal PRN Levie Heritage, DO      . carvedilol (COREG) tablet 25 mg  25 mg Oral BID WC Levie Heritage, DO  25 mg at 05/09/17 0923  . chlorhexidine (PERIDEX) 0.12 % solution 15 mL  15 mL Mouth/Throat TID PC & HS Levie Heritage, DO   15 mL at 05/09/17 1610  . finasteride (PROSCAR) tablet 5 mg  5 mg Oral QPM Levie Heritage, DO   5 mg at 05/08/17 2059  . furosemide (LASIX) tablet 20 mg  20 mg Oral Daily Levie Heritage, DO   20 mg at 05/09/17 9604  . hydrALAZINE (APRESOLINE) injection 10 mg  10 mg Intravenous Q2H PRN Levie Heritage, DO      . HYDROcodone-acetaminophen (NORCO/VICODIN) 5-325 MG per tablet 1 tablet  1 tablet Oral Q4H PRN Levie Heritage, DO      . HYDROmorphone (DILAUDID) injection 0.5 mg  0.5 mg Intravenous Q2H  PRN Levie Heritage, DO      . insulin aspart (novoLOG) injection 0-5 Units  0-5 Units Subcutaneous QHS Stinson, Jacob J, DO      . insulin aspart (novoLOG) injection 0-9 Units  0-9 Units Subcutaneous TID WC Levie Heritage, DO   2 Units at 05/09/17 5409  . insulin glargine (LANTUS) injection 8 Units  8 Units Subcutaneous QHS Levie Heritage, DO   8 Units at 05/08/17 2111  . labetalol (NORMODYNE,TRANDATE) injection 10 mg  10 mg Intravenous Once Azalia Bilis, MD      . latanoprost (XALATAN) 0.005 % ophthalmic solution 1 drop  1 drop Both Eyes QHS Levie Heritage, DO   1 drop at 05/08/17 2108  . olopatadine (PATANOL) 0.1 % ophthalmic solution 1 drop  1 drop Both Eyes BH-q7a Levie Heritage, DO   1 drop at 05/09/17 8119  . ondansetron (ZOFRAN-ODT) disintegrating tablet 4 mg  4 mg Oral Q6H PRN Levie Heritage, DO       Or  . ondansetron [facility]) injection 4 mg  4 mg Intravenous Q6H PRN Levie Heritage, DO      . pantoprazole (PROTONIX) EC tablet 80 mg  80 mg Oral Daily Levie Heritage, DO   80 mg at 05/09/17 1478  . risperiDONE (RISPERDAL) tablet 0.25 mg  0.25 mg Oral BH-q7a Levie Heritage, DO   0.25 mg at 05/09/17 2956  . risperiDONE (RISPERDAL) tablet 2 mg  2 mg Oral QHS Levie Heritage, DO   2 mg at 05/08/17 2100  . senna (SENOKOT) tablet 8.6 mg  1 tablet Oral Daily Levie Heritage, DO   8.6 mg at 05/09/17 2130  . tamsulosin (FLOMAX) capsule 0.4 mg  0.4 mg Oral Daily Levie Heritage, DO   0.4 mg at 05/09/17 8657  . timolol (TIMOPTIC) 0.5 % ophthalmic solution 1 drop  1 drop Both Eyes Daily Levie Heritage, DO   1 drop at 05/09/17 8469     Discharge Medications: STOP taking these medications           apixaban 5 MG Tabs tablet Commonly known as:  ELIQUIS                       TAKE these medications            acetaminophen 325 MG tablet Commonly known as:  TYLENOL Take 325-650 mg by mouth every 6 (six) hours as needed for mild pain or moderate pain.    amiodarone 200  MG tablet Commonly known as:  PACERONE Take 1 tablet (200 mg total) by mouth daily.    bisacodyl 10 MG suppository Commonly  known as:  DULCOLAX Place 10 mg rectally as needed for moderate constipation.    carvedilol 25 MG tablet Commonly known as:  COREG Take 25 mg by mouth 2 (two) times daily with a meal.    cholecalciferol 1000 units tablet Commonly known as:  VITAMIN D Take 1,000 Units by mouth every morning.    finasteride 5 MG tablet Commonly known as:  PROSCAR Take 5 mg by mouth every evening.    furosemide 20 MG tablet Commonly known as:  LASIX Take 20 mg by mouth daily.    insulin glargine 100 UNIT/ML injection Commonly known as:  LANTUS Inject 0.05 mLs (5 Units total) into the skin at bedtime. Hold is FSBS is less than 75 What changed:  how much to take  additional instructions    latanoprost 0.005 % ophthalmic solution Commonly known as:  XALATAN Place 1 drop into both eyes at bedtime.    olopatadine 0.1 % ophthalmic solution Commonly known as:  PATANOL Place 1 drop into both eyes every morning.    omeprazole 20 MG capsule Commonly known as:  PRILOSEC Take 40 mg by mouth every evening. 30 min prior to evening meal.    REFRESH OP Place 1 drop into both eyes 2 (two) times daily.    RISPERDAL 0.25 MG tablet Generic drug:  risperiDONE Take 0.25 mg by mouth every morning.    risperiDONE 2 MG tablet Commonly known as:  RISPERDAL Take 2 mg by mouth at bedtime.    senna 8.6 MG Tabs tablet Commonly known as:  SENOKOT Take 1 tablet by mouth daily.    SYSTANE OP Place 1 drop into both eyes 4 (four) times daily.    tamsulosin 0.4 MG Caps capsule Commonly known as:  FLOMAX Take 1 capsule (0.4 mg total) by mouth daily.    timolol 0.5 % ophthalmic solution Commonly known as:  BETIMOL Place 1 drop into both eyes daily.      Relevant Imaging Results:  Relevant Lab Results:   Additional Information SS#: [id number]  Margarito Liner, LCSW

## 2017-05-09 NOTE — Evaluation (Addendum)
Physical Therapy Evaluation Patient Details Name: Jordan Rowe MRN: [id number] DOB: 1946-02-27 Today's Date: 05/09/2017   History of Present Illness  Dorsel Monteiro is a 71 y.o male found down in his room following a mechanical fall with blood on his face. Pt found to have intercranial hemorrhage and closed fracture of C6 vertebrae both currently being conservatively treatedPrior medical history of GERD, hypertension, cognitive decline, BPH, atrial fibrillation rate controlled, diabetes type 2 on insulin, schizophrenia, history of stroke with mild residual left-sided deficits.  Clinical Impression  Pt admitted with above diagnosis. Pt currently with functional limitations due to the deficits listed below (see PT Problem List). Pt is currently minA for bed mobility, tranfers and ambulation of 75 feet with RW. Pt will benefit from skilled PT to increase their independence and safety with mobility to allow discharge to the venue listed below.       Follow Up Recommendations No PT follow up;Supervision/Assistance - 24 hour (return to ALF)    Equipment Recommendations  None recommended by PT    Recommendations for Other Services       Precautions / Restrictions Precautions Precautions: Fall Restrictions Weight Bearing Restrictions: No      Mobility  Bed Mobility Overal bed mobility: Needs Assistance Bed Mobility: Supine to Sit     Supine to sit: Min assist     General bed mobility comments: minA to aid in bring hips to EoB, vc for reaching across to bedrail to pull into seated  Transfers Overall transfer level: Needs assistance Equipment used: Rolling walker (2 wheeled) Transfers: Sit to/from Stand Sit to Stand: Min assist         General transfer comment: minA for powerup and steadying in standing, vc for hand placement for powerup  Ambulation/Gait Ambulation/Gait assistance: Min assist Ambulation Distance (Feet): 75 Feet Assistive device: Rolling walker (2  wheeled) Gait Pattern/deviations: Step-to pattern;Decreased step length - right;Decreased step length - left;Decreased dorsiflexion - left;Decreased weight shift to left;Narrow base of support Gait velocity: slowed Gait velocity interpretation: Below normal speed for age/gender General Gait Details: minA for steadying as gait increased, with fatigue L knee began minor buckling that pt able to steady with RW as pt returned to room buckling required more PT assist to steady      Balance Overall balance assessment: History of Falls;Needs assistance Sitting-balance support: Bilateral upper extremity supported;Feet supported Sitting balance-Leahy Scale: Fair     Standing balance support: Bilateral upper extremity supported Standing balance-Leahy Scale: Fair Standing balance comment: requires RW to steady in upright                             Pertinent Vitals/Pain Pain Assessment: Faces Faces Pain Scale: Hurts a little bit Pain Location: headache Pain Descriptors / Indicators: Headache    Home Living Family/patient expects to be discharged to:: Skilled nursing facility Living Arrangements: Other (Comment) (ALF facility)                    Prior Function Level of Independence: Needs assistance   Gait / Transfers Assistance Needed: Used RW in the facility  ADL's / Homemaking Assistance Needed: Pt reports that he has assitance getting dressed            Extremity/Trunk Assessment        Lower Extremity Assessment Lower Extremity Assessment: LLE deficits/detail;RLE deficits/detail RLE Deficits / Details: strength grossly +3/5 LLE Deficits / Details: limited by previous stroke, decreased dorsiflexion,  and strength grossly 3/5 LLE Coordination: decreased fine motor       Communication   Communication: Expressive difficulties (limited answers to questioning)  Cognition Arousal/Alertness: Awake/alert Behavior During Therapy: WFL for tasks  assessed/performed Overall Cognitive Status: History of cognitive impairments - at baseline                                 General Comments: limited answering of questioning       General Comments General comments (skin integrity, edema, etc.): VSS throughout ambulation, with sitting in chair pt HR decreased to 52 bpm         Assessment/Plan    PT Assessment Patient needs continued PT services  PT Problem List Decreased strength;Decreased range of motion;Decreased activity tolerance;Decreased balance;Decreased mobility;Decreased safety awareness;Decreased knowledge of precautions       PT Treatment Interventions Gait training;DME instruction;Functional mobility training;Therapeutic activities;Therapeutic exercise;Balance training;Neuromuscular re-education;Patient/family education    PT Goals (Current goals can be found in the Care Plan section)  Acute Rehab PT Goals Patient Stated Goal: go home PT Goal Formulation: With patient Time For Goal Achievement: 05/16/17 Potential to Achieve Goals: Fair    Frequency Min 2X/week        Co-evaluation PT/OT/SLP Co-Evaluation/Treatment: Yes Reason for Co-Treatment: Necessary to address cognition/behavior during functional activity PT goals addressed during session: Mobility/safety with mobility;Balance         AM-PAC PT "6 Clicks" Daily Activity  Outcome Measure Difficulty turning over in bed (including adjusting bedclothes, sheets and blankets)?: A Lot Difficulty moving from lying on back to sitting on the side of the bed? : Unable Difficulty sitting down on and standing up from a chair with arms (e.g., wheelchair, bedside commode, etc,.)?: Unable Help needed moving to and from a bed to chair (including a wheelchair)?: A Little Help needed walking in hospital room?: A Little Help needed climbing 3-5 steps with a railing? : A Little 6 Click Score: 13    End of Session Equipment Utilized During Treatment: Gait  belt Activity Tolerance: Patient tolerated treatment well Patient left: in chair;with call bell/phone within reach;with chair alarm set Nurse Communication: Mobility status PT Visit Diagnosis: Unsteadiness on feet (R26.81);Other abnormalities of gait and mobility (R26.89);Repeated falls (R29.6);Muscle weakness (generalized) (M62.81);Difficulty in walking, not elsewhere classified (R26.2);History of falling (Z91.81)    Time: 4098-1191 PT Time Calculation (min) (ACUTE ONLY): 24 min   Charges:   PT Evaluation $PT Eval Moderate Complexity: 1 Mod     PT G Codes:   PT G-Codes **NOT FOR INPATIENT CLASS** Functional Assessment Tool Used: AM-PAC 6 Clicks Basic Mobility Functional Limitation: Mobility: Walking and moving around Mobility: Walking and Moving Around Current Status (Y7829): At least 40 percent but less than 60 percent impaired, limited or restricted Mobility: Walking and Moving Around Goal Status [phone]): At least 1 percent but less than 20 percent impaired, limited or restricted    Lanora Manis B. Beverely Risen PT, DPT Acute Rehabilitation  [phone] Pager [phone]    Elon Alas Fleet 05/09/2017, 1:33 PM

## 2017-05-09 NOTE — Care Management Note (Signed)
Case Management Note  Patient Details  Name: Jordan Rowe MRN: [id number] Date of Birth: 1946/08/13  Subjective/Objective:  Pt admitted with intercranial hemorrage                Action/Plan:  PTA from ALF - CSW following.   Expected Discharge Date:  05/09/17               Expected Discharge Plan:  Assisted Living / Rest Home  In-House Referral:  Clinical Social Work  Discharge planning Services  CM Consult  Post Acute Care Choice:    Choice offered to:     DME Arranged:    DME Agency:     HH Arranged:    HH Agency:     Status of Service:  Completed, signed off  If discussed at [employer], dates discussed:    Additional Comments: 05/09/2017 CSW spoke with brother and aunt - pt is his own guardian as long as he is alert and oriented.  Pt was alert and oriented during assessement Cherylann Parr, RN 05/09/2017, 12:13 PM

## 2017-05-09 NOTE — Clinical Social Work Note (Addendum)
CSW faxed discharge summary and FL2 to Roxanna at [facility]. CSW attempted calling to verify receipt. No answer/voicemail available. Will try again soon.  Charlynn Court, CSW [phone]  11:39 am CSW spoke with Roxanna. She will review documentation and call CSW to confirm that everything looks correct and patient can return. RN asked patient about guardianship and he said he was not sure. CSW asked Roxanna about legal guardianship. She provided CSW with phone numbers for his aunt and sister. CSW spoke with patient's aunt who stated that his brother, [name] [phone]) is guardian. CSW called Mr. Mellody Dance and he stated that he is HCPOA and the patient is his own guardian. Patient will need PTAR.  Charlynn Court, CSW [phone]  12:37 pm Spoke with Roxanna. She is waiting on RN to review documentation. She said she should be able to get to it today. CSW emphasized that patient has discharge orders and therefore has to return today. RN will call CSW once documentation has been reviewed.  Charlynn Court, CSW [phone]

## 2017-05-09 NOTE — Clinical Social Work Note (Signed)
CSW facilitated patient discharge including contacting patient family and facility to confirm patient discharge plans. Clinical information faxed to facility and family agreeable with plan. CSW arranged ambulance transport via [facility] to [facility]. RN currently on phone with ALF RN giving report.  CSW will sign off for now as social work intervention is no longer needed. Please consult [place] again if new needs arise.  Charlynn Court, CSW [phone]

## 2017-05-09 NOTE — Care Management CC44 (Signed)
Condition Code 44 Documentation Completed  Patient Details  Name: Viral Paneto MRN: [id number] Date of Birth: 1946/07/25   Condition Code 44 given:  Yes Patient signature on Condition Code 44 notice:  Yes Documentation of 2 MD's agreement:  Yes Code 44 added to claim:  Yes    Cherylann Parr, RN 05/09/2017, 12:12 PM

## 2017-05-09 NOTE — Discharge Summary (Signed)
Physician Discharge Summary  [name] WFU:[id number] DOB: 04-03-1946 DOA: 05/08/2017  PCP: System, Pcp Not In  Admit date: 05/08/2017 Discharge date: 05/09/2017  Admitted From: [facility] Disposition:  same   Recommendations for Outpatient Follow-up:  1. Eliquis on hold  Discharge Condition:  stable   CODE STATUS:  DNR   Consultations: neurosurgeryDictation #1 [id number]  [id number]      Discharge Diagnoses:  Principal Problem:   Subarachnoid bleed (HCC) Active Problems:   Traumatic closed fracture of C6 vertebra with minimal displacement, initial encounter (HCC)   Hematoma of oral cavity   MENTAL RETARDATION   Essential hypertension   Diabetes mellitus type 2, controlled (HCC)   Atrial fibrillation (HCC)   Schizophrenia (HCC)   Chronic anticoagulation   Chronic diastolic CHF (congestive heart failure) (HCC)   BPH (benign prostatic hyperplasia)    Subjective: No complaints of pain.   Brief Summary: Jordan Rowe is a 71 y.o. male with a history of GERD, hypertension, cognitive decline, BPH, atrial fibrillation on Eliquis, EF of 202-5%,  diabetes type 2 on insulin, schizophrenia, stroke with mild residual left-sided deficits who presents after a fall around 4 AM.  Found on CT to have a small subarachnoid hemorrhage and C6 mild fracture. He is on hospice and is DNR. Given [facility] in ER to reverse Eliquis.   Hospital Course:   Note : inaccurate Hb reading of 8 today - this was repeated and Hb is found to be at baseline    Small left sylvian fissure subarachnoid hemorrhage in setting of Eliquis - NS consulted and noted that he is not a candidate for surgery if his bleed is to worsen- they did not recommend repeating imaging - this AM, he is alert, being fed breakfast, following commands and speaking normally.   C 6 fracture - NS states this is a small "chip anterior fracture" and recommends a soft collar - no complaint of pain in neck this AM- has not  used any PRN pain meds  Contusion of right face -follow  A-fib - hold Eliquis - cont Amiodarone, Coreg  Chronic systolic CHF in the past (2017)- resolved Grade 2 d CHF - EF 20-25% improved to normal per ECHO on 5/ 18 - still has grade 2 dCHF per above ECHO - Coreg, Lasix  Schizophrenia - Risperdal  BPH - Flomax, Proscar  DM2 - cont home meds  Discharge Instructions  Discharge Instructions    Diet - low sodium heart healthy    Complete by:  As directed    Increase activity slowly    Complete by:  As directed      Allergies as of 05/09/2017   No Known Allergies     Medication List    STOP taking these medications   apixaban 5 MG Tabs tablet Commonly known as:  ELIQUIS     TAKE these medications   acetaminophen 325 MG tablet Commonly known as:  TYLENOL Take 325-650 mg by mouth every 6 (six) hours as needed for mild pain or moderate pain.   amiodarone 200 MG tablet Commonly known as:  PACERONE Take 1 tablet (200 mg total) by mouth daily.   bisacodyl 10 MG suppository Commonly known as:  DULCOLAX Place 10 mg rectally as needed for moderate constipation.   carvedilol 25 MG tablet Commonly known as:  COREG Take 25 mg by mouth 2 (two) times daily with a meal.   cholecalciferol 1000 units tablet Commonly known as:  VITAMIN D Take 1,000 Units by mouth  every morning.   finasteride 5 MG tablet Commonly known as:  PROSCAR Take 5 mg by mouth every evening.   furosemide 20 MG tablet Commonly known as:  LASIX Take 20 mg by mouth daily.   insulin glargine 100 UNIT/ML injection Commonly known as:  LANTUS Inject 0.05 mLs (5 Units total) into the skin at bedtime. Hold is FSBS is less than 75 What changed:  how much to take  additional instructions   latanoprost 0.005 % ophthalmic solution Commonly known as:  XALATAN Place 1 drop into both eyes at bedtime.   olopatadine 0.1 % ophthalmic solution Commonly known as:  PATANOL Place 1 drop into both eyes  every morning.   omeprazole 20 MG capsule Commonly known as:  PRILOSEC Take 40 mg by mouth every evening. 30 min prior to evening meal.   REFRESH OP Place 1 drop into both eyes 2 (two) times daily.   RISPERDAL 0.25 MG tablet Generic drug:  risperiDONE Take 0.25 mg by mouth every morning.   risperiDONE 2 MG tablet Commonly known as:  RISPERDAL Take 2 mg by mouth at bedtime.   senna 8.6 MG Tabs tablet Commonly known as:  SENOKOT Take 1 tablet by mouth daily.   SYSTANE OP Place 1 drop into both eyes 4 (four) times daily.   tamsulosin 0.4 MG Caps capsule Commonly known as:  FLOMAX Take 1 capsule (0.4 mg total) by mouth daily.   timolol 0.5 % ophthalmic solution Commonly known as:  BETIMOL Place 1 drop into both eyes daily.            Discharge Care Instructions        Start     Ordered   05/09/17 0000  Increase activity slowly     05/09/17 1000   05/09/17 0000  Diet - low sodium heart healthy     05/09/17 1000      No Known Allergies   Procedures/Studies:  Ct Head Wo Contrast  Result Date: 05/08/2017 CLINICAL DATA:  Larey Seat on way to bathroom. On blood thinner. History of seizures, atrial fibrillation, diabetes, hypertension. EXAM: CT HEAD WITHOUT CONTRAST CT MAXILLOFACIAL WITHOUT CONTRAST CT CERVICAL SPINE WITHOUT CONTRAST TECHNIQUE: Multidetector CT imaging of the head, cervical spine, and maxillofacial structures were performed using the standard protocol without intravenous contrast. Multiplanar CT image reconstructions of the cervical spine and maxillofacial structures were also generated. COMPARISON:  CT HEAD Jan 14, 2017 and CT cervical spine Jan 13, 2017 FINDINGS: CT HEAD FINDINGS BRAIN: Subcentimeter hemorrhage LEFT sylvian fissure versus posterior insula (axial 19/35). No mass effect, midline shift or acute large vascular territory infarcts. 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 Small area RIGHT frontal lobe encephalomalacia. Confluent supratentorial white  matter hypodensities. Moderate ventriculomegaly on the basis of global parenchymal brain volume loss, stable. No abnormal extra-axial fluid collections. Basal cisterns are patent. VASCULAR: Moderate to severe calcific atherosclerosis of the carotid siphons and to lesser extent LEFT vertebral artery. SKULL: No skull fracture. No significant scalp soft tissue swelling. OTHER: None. CT MAXILLOFACIAL FINDINGS OSSEOUS: The mandible is intact, the condyles are located. No acute facial fracture. No destructive bony lesions. Patient is edentulous. ORBITS: Ocular globes and orbital contents are nonacute. Status post bilateral ocular lens implants. SINUSES: Mild paranasal sinus mucosal thickening. Nasal septum is midline. Included mastoid aircells are well aerated. SOFT TISSUES: RIGHT perioral soft tissue swelling and subcutaneous gas without radiopaque foreign bodies. CT CERVICAL SPINE FINDINGS ALIGNMENT: Maintained lordosis. Vertebral bodies in alignment. SKULL BASE AND VERTEBRAE: Acute Anteroinferior C6  endplate corner fracture. Moderate to severe C5-6 and C6-7 degenerative discs. No destructive bony lesions. C1-2 articulation maintained. Nuchal ligament calcifications present. SOFT TISSUES AND SPINAL CANAL: Nonacute. Mild calcific atherosclerosis carotid bifurcations. DISC LEVELS: No significant osseous canal stenosis. Severe RIGHT C5-6 and moderate bile lateral C6-7 neural foraminal narrowing. UPPER CHEST: Lung apices are clear. OTHER: None. IMPRESSION: CT HEAD: 1. Acute subcentimeter LEFT sylvian fissure subarachnoid hemorrhage versus insular hemorrhagic contusion. 2. Severe chronic small vessel ischemic disease. 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 RIGHT frontal lobe encephalomalacia. 3. Stable moderate atrophy. CT MAXILLOFACIAL: 1. No acute facial fracture 2. Perioral soft tissue swelling and laceration. No radiopaque foreign bodies. CT CERVICAL SPINE: 1. Acute nondisplaced C6 Anteroinferior endplate fracture. No  malalignment. Critical Value/emergent results were called by telephone at the time of interpretation on 05/08/2017 at 6:05 am to Dr. Azalia Bilis , who verbally acknowledged these results. Electronically Signed   By: Awilda Metro M.D.   On: 05/08/2017 06:08   Ct Cervical Spine Wo Contrast  Result Date: 05/08/2017 CLINICAL DATA:  Larey Seat on way to bathroom. On blood thinner. History of seizures, atrial fibrillation, diabetes, hypertension. EXAM: CT HEAD WITHOUT CONTRAST CT MAXILLOFACIAL WITHOUT CONTRAST CT CERVICAL SPINE WITHOUT CONTRAST TECHNIQUE: Multidetector CT imaging of the head, cervical spine, and maxillofacial structures were performed using the standard protocol without intravenous contrast. Multiplanar CT image reconstructions of the cervical spine and maxillofacial structures were also generated. COMPARISON:  CT HEAD Jan 14, 2017 and CT cervical spine Jan 13, 2017 FINDINGS: CT HEAD FINDINGS BRAIN: Subcentimeter hemorrhage LEFT sylvian fissure versus posterior insula (axial 19/35). No mass effect, midline shift or acute large vascular territory infarcts. 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 Small area RIGHT frontal lobe encephalomalacia. Confluent supratentorial white matter hypodensities. Moderate ventriculomegaly on the basis of global parenchymal brain volume loss, stable. No abnormal extra-axial fluid collections. Basal cisterns are patent. VASCULAR: Moderate to severe calcific atherosclerosis of the carotid siphons and to lesser extent LEFT vertebral artery. SKULL: No skull fracture. No significant scalp soft tissue swelling. OTHER: None. CT MAXILLOFACIAL FINDINGS OSSEOUS: The mandible is intact, the condyles are located. No acute facial fracture. No destructive bony lesions. Patient is edentulous. ORBITS: Ocular globes and orbital contents are nonacute. Status post bilateral ocular lens implants. SINUSES: Mild paranasal sinus mucosal thickening. Nasal septum is midline. Included mastoid aircells  are well aerated. SOFT TISSUES: RIGHT perioral soft tissue swelling and subcutaneous gas without radiopaque foreign bodies. CT CERVICAL SPINE FINDINGS ALIGNMENT: Maintained lordosis. Vertebral bodies in alignment. SKULL BASE AND VERTEBRAE: Acute Anteroinferior C6 endplate corner fracture. Moderate to severe C5-6 and C6-7 degenerative discs. No destructive bony lesions. C1-2 articulation maintained. Nuchal ligament calcifications present. SOFT TISSUES AND SPINAL CANAL: Nonacute. Mild calcific atherosclerosis carotid bifurcations. DISC LEVELS: No significant osseous canal stenosis. Severe RIGHT C5-6 and moderate bile lateral C6-7 neural foraminal narrowing. UPPER CHEST: Lung apices are clear. OTHER: None. IMPRESSION: CT HEAD: 1. Acute subcentimeter LEFT sylvian fissure subarachnoid hemorrhage versus insular hemorrhagic contusion. 2. Severe chronic small vessel ischemic disease. 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 RIGHT frontal lobe encephalomalacia. 3. Stable moderate atrophy. CT MAXILLOFACIAL: 1. No acute facial fracture 2. Perioral soft tissue swelling and laceration. No radiopaque foreign bodies. CT CERVICAL SPINE: 1. Acute nondisplaced C6 Anteroinferior endplate fracture. No malalignment. Critical Value/emergent results were called by telephone at the time of interpretation on 05/08/2017 at 6:05 am to Dr. Azalia Bilis , who verbally acknowledged these results. Electronically Signed   By: Awilda Metro M.D.   On: 05/08/2017  06:08   Ct Maxillofacial Wo Contrast  Result Date: 05/08/2017 CLINICAL DATA:  Larey Seat on way to bathroom. On blood thinner. History of seizures, atrial fibrillation, diabetes, hypertension. EXAM: CT HEAD WITHOUT CONTRAST CT MAXILLOFACIAL WITHOUT CONTRAST CT CERVICAL SPINE WITHOUT CONTRAST TECHNIQUE: Multidetector CT imaging of the head, cervical spine, and maxillofacial structures were performed using the standard protocol without intravenous contrast. Multiplanar CT image reconstructions of  the cervical spine and maxillofacial structures were also generated. COMPARISON:  CT HEAD Jan 14, 2017 and CT cervical spine Jan 13, 2017 FINDINGS: CT HEAD FINDINGS BRAIN: Subcentimeter hemorrhage LEFT sylvian fissure versus posterior insula (axial 19/35). No mass effect, midline shift or acute large vascular territory infarcts. 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 Small area RIGHT frontal lobe encephalomalacia. Confluent supratentorial white matter hypodensities. Moderate ventriculomegaly on the basis of global parenchymal brain volume loss, stable. No abnormal extra-axial fluid collections. Basal cisterns are patent. VASCULAR: Moderate to severe calcific atherosclerosis of the carotid siphons and to lesser extent LEFT vertebral artery. SKULL: No skull fracture. No significant scalp soft tissue swelling. OTHER: None. CT MAXILLOFACIAL FINDINGS OSSEOUS: The mandible is intact, the condyles are located. No acute facial fracture. No destructive bony lesions. Patient is edentulous. ORBITS: Ocular globes and orbital contents are nonacute. Status post bilateral ocular lens implants. SINUSES: Mild paranasal sinus mucosal thickening. Nasal septum is midline. Included mastoid aircells are well aerated. SOFT TISSUES: RIGHT perioral soft tissue swelling and subcutaneous gas without radiopaque foreign bodies. CT CERVICAL SPINE FINDINGS ALIGNMENT: Maintained lordosis. Vertebral bodies in alignment. SKULL BASE AND VERTEBRAE: Acute Anteroinferior C6 endplate corner fracture. Moderate to severe C5-6 and C6-7 degenerative discs. No destructive bony lesions. C1-2 articulation maintained. Nuchal ligament calcifications present. SOFT TISSUES AND SPINAL CANAL: Nonacute. Mild calcific atherosclerosis carotid bifurcations. DISC LEVELS: No significant osseous canal stenosis. Severe RIGHT C5-6 and moderate bile lateral C6-7 neural foraminal narrowing. UPPER CHEST: Lung apices are clear. OTHER: None. IMPRESSION: CT HEAD: 1. Acute  subcentimeter LEFT sylvian fissure subarachnoid hemorrhage versus insular hemorrhagic contusion. 2. Severe chronic small vessel ischemic disease. 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 RIGHT frontal lobe encephalomalacia. 3. Stable moderate atrophy. CT MAXILLOFACIAL: 1. No acute facial fracture 2. Perioral soft tissue swelling and laceration. No radiopaque foreign bodies. CT CERVICAL SPINE: 1. Acute nondisplaced C6 Anteroinferior endplate fracture. No malalignment. Critical Value/emergent results were called by telephone at the time of interpretation on 05/08/2017 at 6:05 am to Dr. Azalia Bilis , who verbally acknowledged these results. Electronically Signed   By: Awilda Metro M.D.   On: 05/08/2017 06:08       Discharge Exam: Vitals:   05/09/17 0400 05/09/17 0900  BP: 122/74 (!) 147/90  Pulse: (!) 51 70  Resp: 15   Temp: 98.6 F (37 C) 98.4 F (36.9 C)  SpO2: 99%    Vitals:   05/08/17 2352 05/09/17 0000 05/09/17 0400 05/09/17 0900  BP: 136/87 (!) 174/101 122/74 (!) 147/90  Pulse: 65 69 (!) 51 70  Resp: Temp: 98.2 F (36.8 C)  98.6 F (37 C) 98.4 F (36.9 C)  TempSrc: Oral  Oral Oral  SpO2: 99% 98% 99%   Weight:   77 kg (169 lb 12.1 oz)   Height:        General: Pt is alert, awake, not in acute distress- right maxillary swelling noted Cardiovascular: RRR, S1/S2 +, no rubs, no gallops Respiratory: CTA bilaterally, no wheezing, no rhonchi Abdominal: Soft, NT, ND, bowel sounds + Extremities: no edema, no cyanosis  The results of significant diagnostics from this hospitalization (including imaging, microbiology, ancillary and laboratory) are listed below for reference.     Microbiology: Recent Results (from the past 240 hour(s))  MRSA PCR Screening     Status: None   Collection Time: 05/08/17  8:13 PM  Result Value Ref Range Status   MRSA by PCR NEGATIVE NEGATIVE Final    Comment:        The GeneXpert MRSA Assay (FDA approved for NASAL specimens only), is one  component of a comprehensive MRSA colonization surveillance program. It is not intended to diagnose MRSA infection nor to guide or monitor treatment for MRSA infections.      Labs: BNP (last 3 results) No results for input(s): BNP in the last 8760 hours. Basic Metabolic Panel:  Recent Labs Lab 05/08/17 0920 05/08/17 0931 05/09/17 0522  NA 139 138 140  K 4.0 3.9 3.5  CL 104 103 103  CO2 27  --  28  GLUCOSE 142* 146* 168*  BUN CREATININE 1.09 1.00 1.17  CALCIUM 8.9  --  8.8*   Liver Function Tests: No results for input(s): AST, ALT, ALKPHOS, BILITOT, PROT, ALBUMIN in the last 168 hours. No results for input(s): LIPASE, AMYLASE in the last 168 hours. No results for input(s): AMMONIA in the last 168 hours. CBC:  Recent Labs Lab 05/08/17 0920 05/08/17 0931 05/09/17 0247 05/09/17 0826  WBC 9.2  --  7.8 8.4  HGB 14.5 15.3 8.9* 14.5  HCT 43.6 45.0 29.5* 43.1  MCV 92.8  --  90.5 91.7  PLT 172  --  140* 171   Cardiac Enzymes: No results for input(s): CKTOTAL, CKMB, CKMBINDEX, TROPONINI in the last 168 hours. BNP: Invalid input(s): POCBNP CBG:  Recent Labs Lab 05/08/17 2103 05/09/17 0733  GLUCAP 137* 157*   D-Dimer No results for input(s): DDIMER in the last 72 hours. Hgb A1c No results for input(s): HGBA1C in the last 72 hours. Lipid Profile No results for input(s): CHOL, HDL, LDLCALC, TRIG, CHOLHDL, LDLDIRECT in the last 72 hours. Thyroid function studies No results for input(s): TSH, T4TOTAL, T3FREE, THYROIDAB in the last 72 hours.  Invalid input(s): FREET3 Anemia work up No results for input(s): VITAMINB12, FOLATE, FERRITIN, TIBC, IRON, RETICCTPCT in the last 72 hours. Urinalysis    Component Value Date/Time   COLORURINE YELLOW 01/13/2017 1343   APPEARANCEUR CLEAR 01/13/2017 1343   LABSPEC 1.005 01/13/2017 1343   PHURINE 5.0 01/13/2017 1343   GLUCOSEU NEGATIVE 01/13/2017 1343   HGBUR NEGATIVE 01/13/2017 1343   BILIRUBINUR NEGATIVE  01/13/2017 1343   KETONESUR NEGATIVE 01/13/2017 1343   PROTEINUR NEGATIVE 01/13/2017 1343   UROBILINOGEN 1.0 02/01/2013 2300   NITRITE NEGATIVE 01/13/2017 1343   LEUKOCYTESUR NEGATIVE 01/13/2017 1343   Sepsis Labs Invalid input(s): PROCALCITONIN,  WBC,  LACTICIDVEN Microbiology Recent Results (from the past 240 hour(s))  MRSA PCR Screening     Status: None   Collection Time: 05/08/17  8:13 PM  Result Value Ref Range Status   MRSA by PCR NEGATIVE NEGATIVE Final    Comment:        The GeneXpert MRSA Assay (FDA approved for NASAL specimens only), is one component of a comprehensive MRSA colonization surveillance program. It is not intended to diagnose MRSA infection nor to guide or monitor treatment for MRSA infections.      Time coordinating discharge: Over 30 minutes  SIGNED:   Calvert Cantor, MD  [facility] 05/09/2017, 10:27 AM Pager   If  7PM-7AM, please contact night-coverage [email or web address] Password [email or web address]

## 2017-08-07 ENCOUNTER — Emergency Department (HOSPITAL_COMMUNITY): Payer: Medicare Other

## 2017-08-07 ENCOUNTER — Other Ambulatory Visit: Payer: Self-pay

## 2017-08-07 ENCOUNTER — Inpatient Hospital Stay (HOSPITAL_COMMUNITY)
Admission: EM | Admit: 2017-08-07 | Discharge: 2017-08-11 | DRG: 309 | Disposition: A | Discharge status: Skilled Nursing Facility | Payer: Medicare Other | Attending: Family Medicine | Admitting: Family Medicine

## 2017-08-07 DIAGNOSIS — H409 Unspecified glaucoma: Secondary | ICD-10-CM | POA: Diagnosis present

## 2017-08-07 DIAGNOSIS — R531 Weakness: Secondary | ICD-10-CM | POA: Diagnosis present

## 2017-08-07 DIAGNOSIS — E559 Vitamin D deficiency, unspecified: Secondary | ICD-10-CM | POA: Diagnosis present

## 2017-08-07 DIAGNOSIS — R402142 Coma scale, eyes open, spontaneous, at arrival to emergency department: Secondary | ICD-10-CM | POA: Diagnosis present

## 2017-08-07 DIAGNOSIS — Z79899 Other long term (current) drug therapy: Secondary | ICD-10-CM

## 2017-08-07 DIAGNOSIS — W07XXXA Fall from chair, initial encounter: Secondary | ICD-10-CM | POA: Diagnosis present

## 2017-08-07 DIAGNOSIS — I351 Nonrheumatic aortic (valve) insufficiency: Secondary | ICD-10-CM | POA: Diagnosis not present

## 2017-08-07 DIAGNOSIS — I429 Cardiomyopathy, unspecified: Secondary | ICD-10-CM | POA: Diagnosis present

## 2017-08-07 DIAGNOSIS — Z7901 Long term (current) use of anticoagulants: Secondary | ICD-10-CM | POA: Diagnosis not present

## 2017-08-07 DIAGNOSIS — Z9841 Cataract extraction status, right eye: Secondary | ICD-10-CM

## 2017-08-07 DIAGNOSIS — I110 Hypertensive heart disease with heart failure: Secondary | ICD-10-CM | POA: Diagnosis present

## 2017-08-07 DIAGNOSIS — R001 Bradycardia, unspecified: Secondary | ICD-10-CM | POA: Diagnosis present

## 2017-08-07 DIAGNOSIS — F79 Unspecified intellectual disabilities: Secondary | ICD-10-CM | POA: Diagnosis present

## 2017-08-07 DIAGNOSIS — I5032 Chronic diastolic (congestive) heart failure: Secondary | ICD-10-CM | POA: Diagnosis present

## 2017-08-07 DIAGNOSIS — Z8249 Family history of ischemic heart disease and other diseases of the circulatory system: Secondary | ICD-10-CM | POA: Diagnosis not present

## 2017-08-07 DIAGNOSIS — Z794 Long term (current) use of insulin: Secondary | ICD-10-CM | POA: Diagnosis not present

## 2017-08-07 DIAGNOSIS — F209 Schizophrenia, unspecified: Secondary | ICD-10-CM | POA: Diagnosis present

## 2017-08-07 DIAGNOSIS — N400 Enlarged prostate without lower urinary tract symptoms: Secondary | ICD-10-CM | POA: Diagnosis present

## 2017-08-07 DIAGNOSIS — Z8673 Personal history of transient ischemic attack (TIA), and cerebral infarction without residual deficits: Secondary | ICD-10-CM | POA: Diagnosis not present

## 2017-08-07 DIAGNOSIS — K219 Gastro-esophageal reflux disease without esophagitis: Secondary | ICD-10-CM | POA: Diagnosis present

## 2017-08-07 DIAGNOSIS — K5900 Constipation, unspecified: Secondary | ICD-10-CM | POA: Diagnosis present

## 2017-08-07 DIAGNOSIS — Z9842 Cataract extraction status, left eye: Secondary | ICD-10-CM | POA: Diagnosis not present

## 2017-08-07 DIAGNOSIS — F17200 Nicotine dependence, unspecified, uncomplicated: Secondary | ICD-10-CM | POA: Diagnosis present

## 2017-08-07 DIAGNOSIS — R52 Pain, unspecified: Secondary | ICD-10-CM

## 2017-08-07 DIAGNOSIS — Z9181 History of falling: Secondary | ICD-10-CM

## 2017-08-07 DIAGNOSIS — R402242 Coma scale, best verbal response, confused conversation, at arrival to emergency department: Secondary | ICD-10-CM | POA: Diagnosis present

## 2017-08-07 DIAGNOSIS — Z961 Presence of intraocular lens: Secondary | ICD-10-CM | POA: Diagnosis present

## 2017-08-07 DIAGNOSIS — E119 Type 2 diabetes mellitus without complications: Secondary | ICD-10-CM | POA: Diagnosis present

## 2017-08-07 DIAGNOSIS — I2510 Atherosclerotic heart disease of native coronary artery without angina pectoris: Secondary | ICD-10-CM | POA: Diagnosis present

## 2017-08-07 DIAGNOSIS — I480 Paroxysmal atrial fibrillation: Secondary | ICD-10-CM | POA: Diagnosis present

## 2017-08-07 DIAGNOSIS — R402362 Coma scale, best motor response, obeys commands, at arrival to emergency department: Secondary | ICD-10-CM | POA: Diagnosis present

## 2017-08-07 DIAGNOSIS — E876 Hypokalemia: Secondary | ICD-10-CM | POA: Diagnosis present

## 2017-08-07 DIAGNOSIS — I700 Atherosclerosis of aorta: Secondary | ICD-10-CM | POA: Diagnosis present

## 2017-08-07 HISTORY — DX: Unspecified displaced fracture of sixth cervical vertebra, initial encounter for closed fracture: S12.500A

## 2017-08-07 HISTORY — DX: Nontraumatic intracranial hemorrhage, unspecified: I62.9

## 2017-08-07 LAB — CBC
HCT: 45.4 % (ref 39.0–52.0)
Hemoglobin: 15.6 g/dL (ref 13.0–17.0)
MCH: 31.7 pg (ref 26.0–34.0)
MCHC: 34.4 g/dL (ref 30.0–36.0)
MCV: 92.3 fL (ref 78.0–100.0)
PLATELETS: ADEQUATE 10*3/uL (ref 150–400)
RBC: 4.92 MIL/uL (ref 4.22–5.81)
RDW: 14.5 % (ref 11.5–15.5)
WBC: 6.1 10*3/uL (ref 4.0–10.5)

## 2017-08-07 LAB — I-STAT TROPONIN, ED: Troponin i, poc: 0.01 ng/mL (ref 0.00–0.08)

## 2017-08-07 LAB — BASIC METABOLIC PANEL
Anion gap: 9 (ref 5–15)
BUN: 9 mg/dL (ref 6–20)
CO2: 24 mmol/L (ref 22–32)
CREATININE: 1.14 mg/dL (ref 0.61–1.24)
Calcium: 8.9 mg/dL (ref 8.9–10.3)
Chloride: 107 mmol/L (ref 101–111)
GFR calc Af Amer: >60 mL/min (ref >60)
GLUCOSE: 96 mg/dL (ref 65–99)
Potassium: 4.3 mmol/L (ref 3.5–5.1)
SODIUM: 140 mmol/L (ref 135–145)

## 2017-08-07 LAB — TSH: TSH: 0.472 u[IU]/mL (ref 0.350–4.500)

## 2017-08-07 IMAGING — DX DG CHEST 2V
2 series · 2 of 2 positions shown · non-contrast
Comparison: [DATE]

CLINICAL DATA: Pain following fall

EXAM:
CHEST  2 VIEW

[x chest ap]
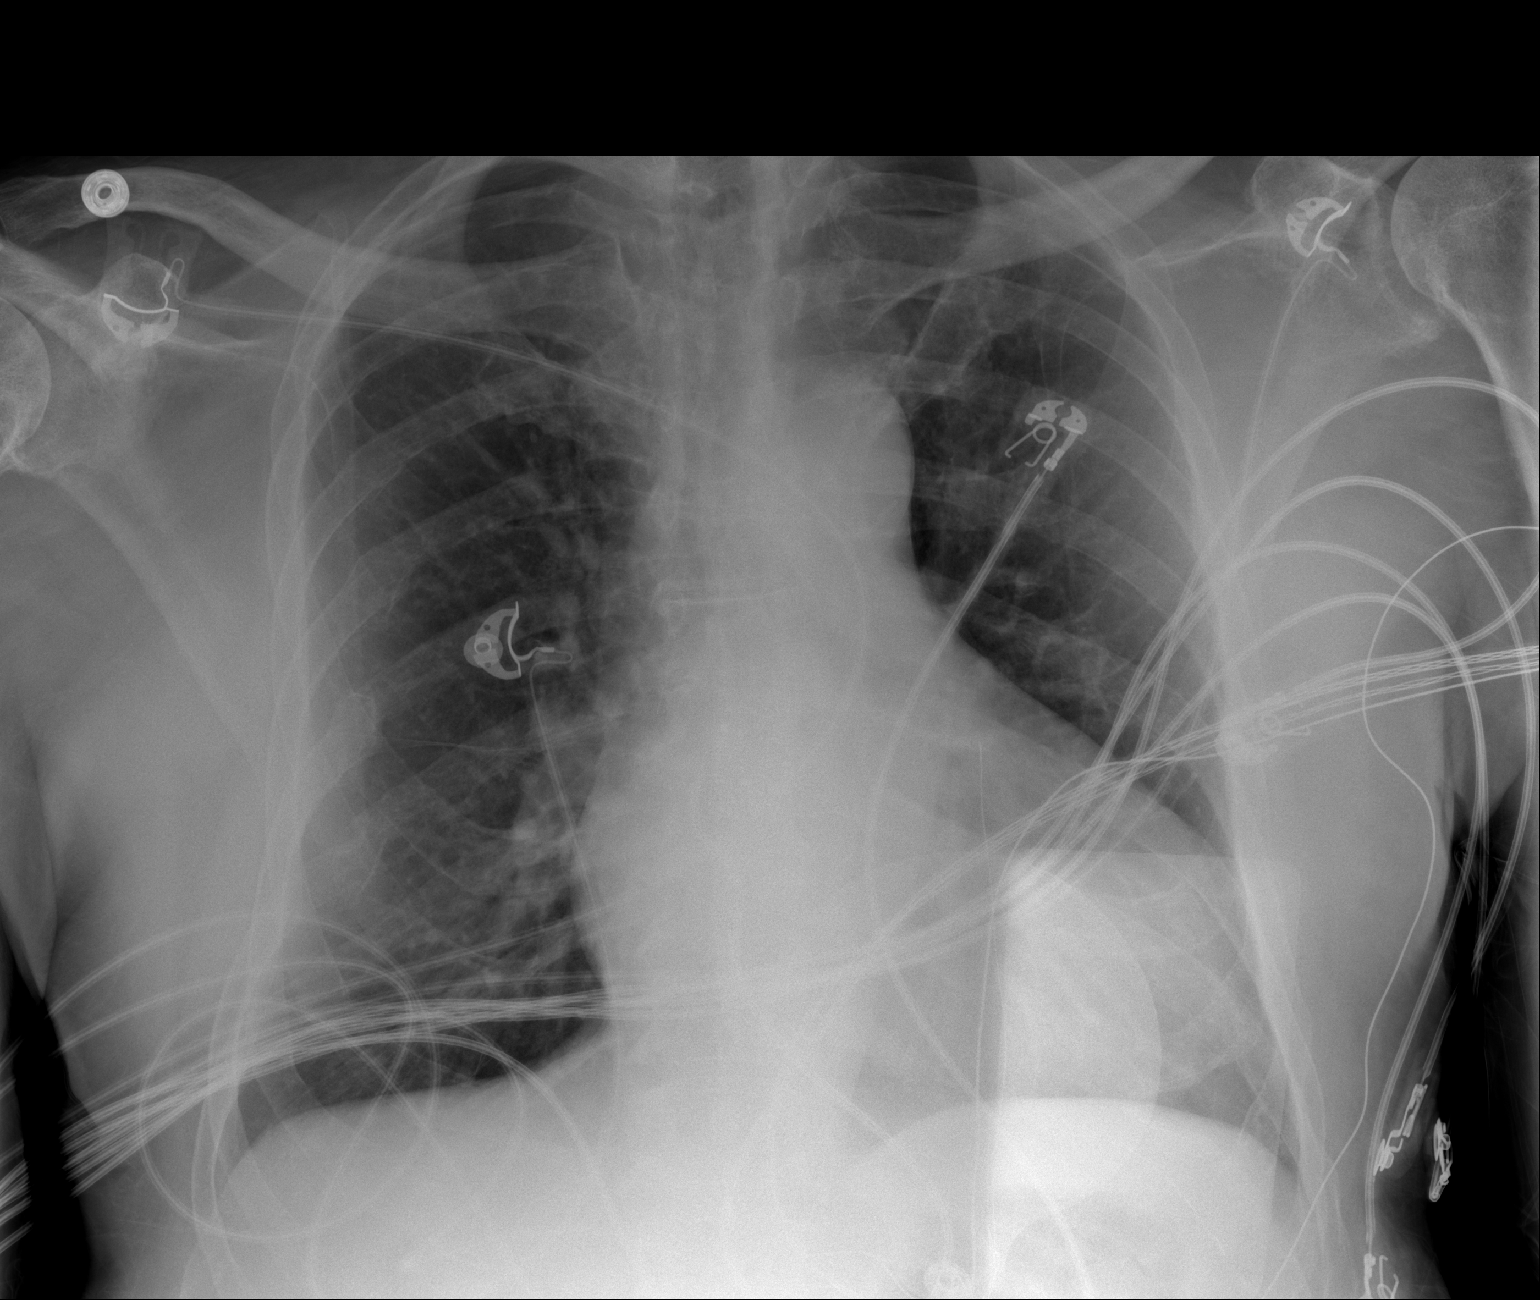

[w chest lat]
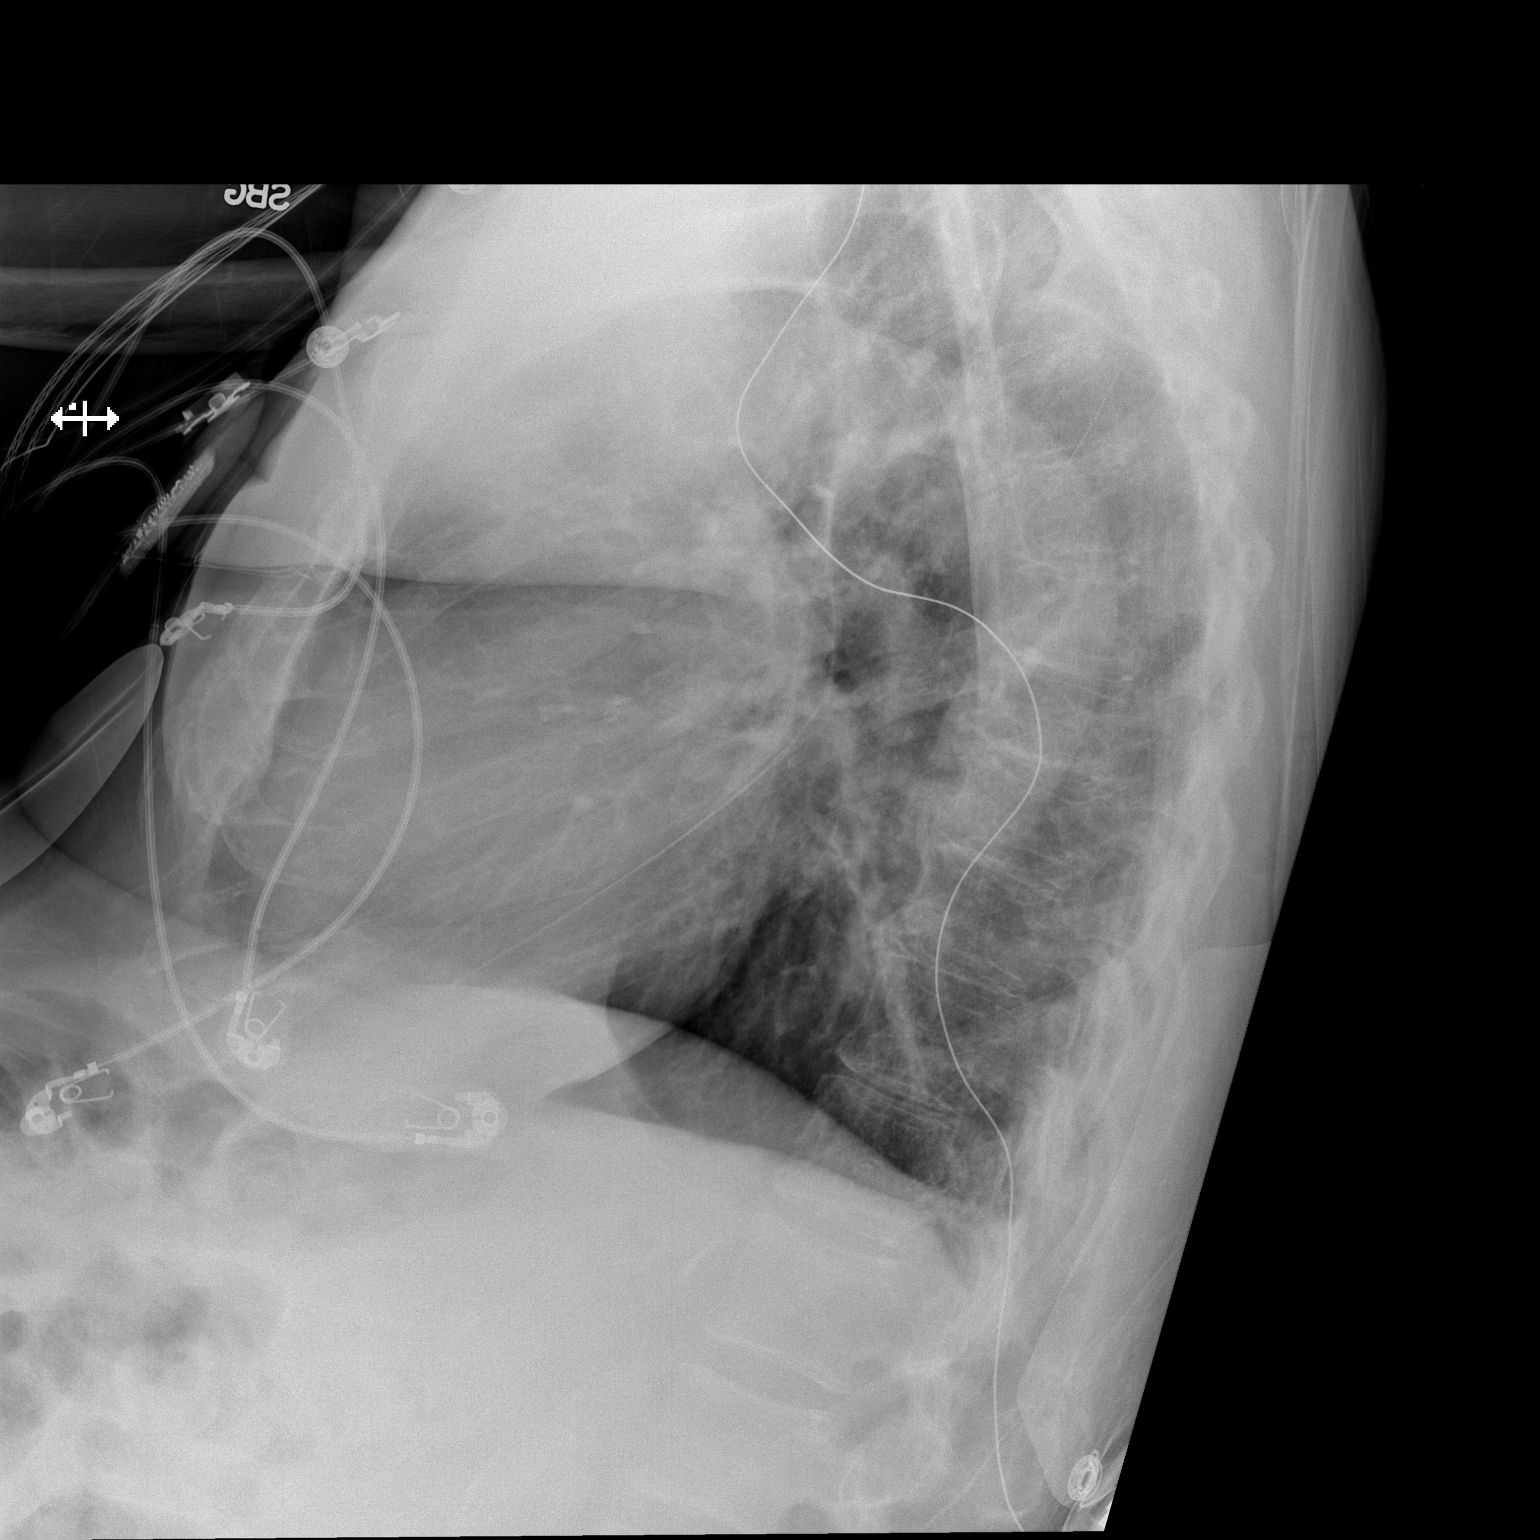

[2 of 2 positions shown; findings below may reference images not displayed]

FINDINGS: There is no edema or consolidation. The heart is upper normal in
size with pulmonary vascularity within normal limits. No adenopathy.
There is calcification in the left anterior descending coronary
artery. There is aortic atherosclerosis. There is degenerative
change in thoracic spine. There old healed rib fractures on the
right.
IMPRESSION: Foci of left anterior descending coronary artery calcification.
There is aortic atherosclerosis. There is no edema or consolidation.

Aortic Atherosclerosis ([Q7]-[Q7]).

## 2017-08-07 MED ORDER — DOCUSATE SODIUM 100 MG PO CAPS
100.0000 mg | ORAL_CAPSULE | Freq: Two times a day (BID) | ORAL | Status: DC
  Administered 2017-08-08 – 2017-08-11 (×7): 100 mg via ORAL
  Filled 2017-08-07 (×7): qty 1

## 2017-08-07 MED ORDER — ACETAMINOPHEN 325 MG PO TABS: 650.0000 mg | ORAL_TABLET | Freq: Four times a day (QID) | ORAL | Status: DC | PRN

## 2017-08-07 MED ORDER — SODIUM CHLORIDE 0.9% FLUSH
3.0000 mL | Freq: Two times a day (BID) | INTRAVENOUS | Status: DC
  Administered 2017-08-07 – 2017-08-11 (×8): 3 mL via INTRAVENOUS

## 2017-08-07 MED ORDER — SODIUM CHLORIDE 0.9 % IV SOLN: 250.0000 mL | INTRAVENOUS | Status: DC | PRN

## 2017-08-07 MED ORDER — TAMSULOSIN HCL 0.4 MG PO CAPS
0.4000 mg | ORAL_CAPSULE | Freq: Every day | ORAL | Status: DC
  Administered 2017-08-08 – 2017-08-11 (×4): 0.4 mg via ORAL
  Filled 2017-08-07 (×4): qty 1

## 2017-08-07 MED ORDER — RISPERIDONE 0.25 MG PO TABS
0.2500 mg | ORAL_TABLET | Freq: Every day | ORAL | Status: DC
  Administered 2017-08-08 – 2017-08-11 (×4): 0.25 mg via ORAL
  Filled 2017-08-07 (×4): qty 1

## 2017-08-07 MED ORDER — FUROSEMIDE 20 MG PO TABS
20.0000 mg | ORAL_TABLET | Freq: Every day | ORAL | Status: DC
  Administered 2017-08-08 – 2017-08-11 (×4): 20 mg via ORAL
  Filled 2017-08-07 (×4): qty 1

## 2017-08-07 MED ORDER — FINASTERIDE 5 MG PO TABS
5.0000 mg | ORAL_TABLET | Freq: Every evening | ORAL | Status: DC
  Administered 2017-08-08 – 2017-08-11 (×4): 5 mg via ORAL
  Filled 2017-08-07 (×4): qty 1

## 2017-08-07 MED ORDER — INSULIN ASPART 100 UNIT/ML ~~LOC~~ SOLN
0.0000 [IU] | Freq: Three times a day (TID) | SUBCUTANEOUS | Status: DC
  Administered 2017-08-08 – 2017-08-10 (×4): 1 [IU] via SUBCUTANEOUS
  Administered 2017-08-11: 2 [IU] via SUBCUTANEOUS
  Administered 2017-08-11: 1 [IU] via SUBCUTANEOUS

## 2017-08-07 MED ORDER — ACETAMINOPHEN 650 MG RE SUPP: 650.0000 mg | Freq: Four times a day (QID) | RECTAL | Status: DC | PRN

## 2017-08-07 MED ORDER — LATANOPROST 0.005 % OP SOLN
1.0000 [drp] | Freq: Every day | OPHTHALMIC | Status: DC
  Administered 2017-08-08 – 2017-08-10 (×3): 1 [drp] via OPHTHALMIC
  Filled 2017-08-07: qty 2.5

## 2017-08-07 MED ORDER — ENOXAPARIN SODIUM 40 MG/0.4ML ~~LOC~~ SOLN
40.0000 mg | SUBCUTANEOUS | Status: DC
  Administered 2017-08-08 – 2017-08-11 (×4): 40 mg via SUBCUTANEOUS
  Filled 2017-08-07 (×4): qty 0.4

## 2017-08-07 MED ORDER — HYDRALAZINE HCL 10 MG PO TABS
10.0000 mg | ORAL_TABLET | Freq: Three times a day (TID) | ORAL | Status: DC | PRN
  Filled 2017-08-07: qty 1

## 2017-08-07 MED ORDER — SODIUM CHLORIDE 0.9% FLUSH: 3.0000 mL | INTRAVENOUS | Status: DC | PRN

## 2017-08-07 MED ORDER — RISPERIDONE 2 MG PO TABS
2.0000 mg | ORAL_TABLET | Freq: Every day | ORAL | Status: DC
  Administered 2017-08-08 – 2017-08-10 (×3): 2 mg via ORAL
  Filled 2017-08-07 (×5): qty 1

## 2017-08-07 NOTE — ED Triage Notes (Signed)
To ED via [facility] --from [facility] per staff- pt fell backwards in room, no c/o pain, but when staff was assessing pt they noticed that pt's heart rate was 35-40.  On arrival pt is awake, alert, x 4. Cool to touch.

## 2017-08-07 NOTE — ED Provider Notes (Signed)
[facility] EMERGENCY DEPARTMENT Provider Note   CSN: [id number] Arrival date & time: 08/07/17  1500     History   Chief Complaint No chief complaint on file.   HPI Damari Suastegui is a 71 y.o. male who presents with weakness. PMH significant for HTN, insulin dependent Type 2 DM, hx of PAF not anticoagulated, CHF EF 50-55%, schizophrenia, DNR. From [facility]. The patient is a poor/unreliable historian. The patient was walking with a walker this morning and "slumped down" and fell on to his left side. EMS was called and the patient's heart rate was noted to be in the 30s. He denies any symptoms and is asking for coffee. [facility] staff was contacted and stated that the patient fell backwards and landed on to his left hip. No head injury or LOC. They state that as of 12/5 his heart rate was in the 50-60s. He was discharged from hospice last week due to improvement in his EF.   LEVEL 5 CAVEAT due to psych disorder      HPI  Past Medical History:  Diagnosis Date  . Atrial fibrillation with RVR (HCC)    [name] 09/30/2015  . BPH (benign prostatic hypertrophy)    [name] 09/30/2015  . Constipation   . Edema   . GERD (gastroesophageal reflux disease)   . Glaucoma   . Hypertension   . Mental retardation    [name] 09/30/2015  . Schizophrenia (HCC)   . Stroke (HCC)   . Type II diabetes mellitus (HCC)    [name] 09/30/2015  . Vitamin D deficiency     Patient Active Problem List   Diagnosis Date Noted  . Chronic diastolic CHF (congestive heart failure) (HCC) 05/09/2017  . BPH (benign prostatic hyperplasia) 05/09/2017  . Subarachnoid bleed (HCC) 05/09/2017  . Traumatic closed fracture of C6 vertebra with minimal displacement, initial encounter (HCC) 05/08/2017  . Chronic anticoagulation 05/08/2017  . Hematoma of oral cavity 05/08/2017  . Fall   . Stroke-like symptom 01/13/2017  . NSVT (nonsustained ventricular tachycardia) (HCC) 10/05/2015  .  Cardiomyopathy (HCC)   . Smoker 10/01/2015  . Schizophrenia (HCC)   . Diabetes mellitus type 2, controlled (HCC) 09/30/2015  . Atrial fibrillation (HCC) 09/30/2015  . Pain in the chest   . 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 Diabetes mellitus (HCC) 02/02/2013  . Dehydration 02/02/2013  . MENTAL RETARDATION 02/12/2009  . Essential hypertension 02/12/2009  . GERD 02/12/2009  . CONSTIPATION 02/12/2009    Past Surgical History:  Procedure Laterality Date  . CATARACT EXTRACTION Left 12/2002   [name] 01/12/2011  . CATARACT EXTRACTION W/ INTRAOCULAR LENS IMPLANT Right 01/2011   [name] 02/18/2011  . MULTIPLE TOOTH EXTRACTIONS         Home Medications    Prior to Admission medications   Medication Sig Start Date End Date Taking? Authorizing Provider  acetaminophen (TYLENOL) 325 MG tablet Take 325-650 mg by mouth every 6 (six) hours as needed for mild pain or moderate pain.     [provider]  amiodarone (PACERONE) 200 MG tablet Take 1 tablet (200 mg total) by mouth daily. 10/30/15   Newman Nip, NP  bisacodyl (DULCOLAX) 10 MG suppository Place 10 mg rectally as needed for moderate constipation.    [provider]  carvedilol (COREG) 25 MG tablet Take 25 mg by mouth 2 (two) times daily with a meal.    [provider]  cholecalciferol (VITAMIN D) 1000 UNITS tablet Take 1,000 Units by mouth every morning.  [provider]  finasteride (PROSCAR) 5 MG tablet Take 5 mg by mouth every evening.     [provider]  furosemide (LASIX) 20 MG tablet Take 20 mg by mouth daily.    [provider]  insulin glargine (LANTUS) 100 UNIT/ML injection Inject 0.05 mLs (5 Units total) into the skin at bedtime. Hold is FSBS is less than 75 Patient taking differently: Inject 8 Units into the skin at bedtime. Hold is FSBS is less than 75 10/07/15   Christiane Ha, MD  latanoprost (XALATAN) 0.005 % ophthalmic solution Place 1 drop into both eyes at bedtime.     [provider]  olopatadine (PATANOL) 0.1 % ophthalmic solution Place 1 drop into both eyes every morning.    [provider]  omeprazole (PRILOSEC) 20 MG capsule Take 40 mg by mouth every evening. 30 min prior to evening meal.    [provider]  Polyethyl Glycol-Propyl Glycol (SYSTANE OP) Place 1 drop into both eyes 4 (four) times daily.    [provider]  Polyvinyl Alcohol-Povidone (REFRESH OP) Place 1 drop into both eyes 2 (two) times daily.    [provider]  risperiDONE (RISPERDAL) 0.25 MG tablet Take 0.25 mg by mouth every morning.     [provider]  risperiDONE (RISPERDAL) 2 MG tablet Take 2 mg by mouth at bedtime.    [provider]  senna (SENOKOT) 8.6 MG TABS Take 1 tablet by mouth daily.     [provider]  tamsulosin (FLOMAX) 0.4 MG CAPS capsule Take 1 capsule (0.4 mg total) by mouth daily. 10/22/16   Trixie Dredge, PA-C  timolol (BETIMOL) 0.5 % ophthalmic solution Place 1 drop into both eyes daily.    [provider]    Family History Family History  Problem Relation Age of Onset  . Hypertension Mother     Social History Social History   Tobacco Use  . Smoking status: Current Every Day Smoker  . Smokeless tobacco: Never Used  Substance Use Topics  . Alcohol use: No  . Drug use: No     Allergies   Patient has no known allergies.   Review of Systems Review of Systems  Unable to perform ROS: Psychiatric disorder     Physical Exam Updated Vital Signs BP (!) 173/98 (BP Location: Left Arm)   Pulse (!) 37   Temp (!) 97.5 F (36.4 C) (Oral)   Resp 14   SpO2 100%   Physical Exam  Constitutional: He is oriented to person, place, and time. He appears well-developed and well-nourished. He appears lethargic. He is easily aroused. No distress.  HENT:  Head: Normocephalic and atraumatic.  Eyes: Conjunctivae are normal. Pupils are equal, round, and reactive to light. Right eye  exhibits no discharge. Left eye exhibits no discharge. No scleral icterus.  Neck: Normal range of motion.  Cardiovascular: Bradycardia present. Exam reveals no gallop and no friction rub.  No murmur heard. Pulmonary/Chest: Effort normal and breath sounds normal. No stridor. No respiratory distress. He has no wheezes. He has no rales. He exhibits no tenderness.  Abdominal: Soft. Bowel sounds are normal. He exhibits no distension. There is no tenderness.  Neurological: He is oriented to person, place, and time and easily aroused. He appears lethargic. GCS eye subscore is 4. GCS verbal subscore is 4. GCS motor subscore is 6.  Skin: Skin is warm and dry.  Psychiatric: He has a normal mood and affect. His behavior is normal.  Nursing  note and vitals reviewed.    ED Treatments / Results  Labs (all labs ordered are listed, but only abnormal results are displayed) Labs Reviewed  CBC  BASIC METABOLIC PANEL  TSH  I-STAT TROPONIN, ED    EKG  EKG Interpretation  Date/Time:  Sunday August 07 2017 15:08:18 EST Ventricular Rate:  39 PR Interval:    QRS Duration: 99 QT Interval:  627 QTC Calculation: 506 R Axis:   -27 Text Interpretation:  Sinus or ectopic atrial bradycardia Borderline short PR interval Borderline left axis deviation Abnormal R-wave progression, early transition Borderline ST depression, lateral leads Prolonged QT interval Since previous tracing rate slower, QT interval longer Confirmed by Jerelyn Scott [id number]) on 08/07/2017 3:14:16 PM       Radiology Dg Chest 2 View  Result Date: 08/07/2017 CLINICAL DATA:  Pain following fall EXAM: CHEST  2 VIEW COMPARISON:  Jan 13, 2017 FINDINGS: There is no edema or consolidation. The heart is upper normal in size with pulmonary vascularity within normal limits. No adenopathy. There is calcification in the left anterior descending coronary artery. There is aortic atherosclerosis. There is degenerative change in thoracic spine. There old  healed rib fractures on the right. IMPRESSION: Foci of left anterior descending coronary artery calcification. There is aortic atherosclerosis. There is no edema or consolidation. Aortic Atherosclerosis (ICD10-I70.0). Electronically Signed   By: Bretta Bang III M.D.   On: 08/07/2017 17:11   Ct Head Wo Contrast  Result Date: 08/07/2017 CLINICAL DATA:  Larey Seat backwards in room, bradycardia, hospice patient, unexplained altered level of consciousness EXAM: CT HEAD WITHOUT CONTRAST TECHNIQUE: Contiguous axial images were obtained from the base of the skull through the vertex without intravenous contrast. Sagittal and coronal MPR images reconstructed from axial data set. COMPARISON:  05/08/2017 FINDINGS: Brain: Generalized atrophy. Normal ventricular morphology. No midline shift or mass effect. Small vessel chronic ischemic changes of deep cerebral white matter. Old lacunar infarct RIGHT thalamus. Small old RIGHT frontal infarct. Resolution of the small new LEFT insular hemorrhage seen on the previous exam. No intracranial hemorrhage, mass lesion, evidence of acute infarction, or extra-axial fluid collection. Vascular: Extensive atherosclerotic calcification of internal carotid and vertebral arteries at skullbase Skull: No acute osseous abnormalities. Stable well-circumscribed lucency in the posterior RIGHT parietal bone near lambdoid suture. Sinuses/Orbits: Clear Other: N/A IMPRESSION: Atrophy with small vessel chronic ischemic changes of deep cerebral white matter. 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 No acute intracranial abnormalities. Electronically Signed   By: Ulyses Southward M.D.   On: 08/07/2017 16:12   Dg Hip Unilat With Pelvis 2-3 Views Left  Result Date: 08/07/2017 CLINICAL DATA:  Larey Seat at nursing facility.  Bradycardia. EXAM: DG HIP (WITH OR WITHOUT PELVIS) 2-3V LEFT COMPARISON:  None. FINDINGS: There is no evidence of hip fracture or dislocation. Whiskering of the bilateral iliac  bones associated with DISH. There is no evidence of arthropathy or other focal bone abnormality. Moderate aortoiliac calcifications. IMPRESSION: No acute fracture deformity or dislocation. Aortic Atherosclerosis (ICD10-I70.0). Electronically Signed   By: Awilda Metro M.D.   On: 08/07/2017 17:12    Procedures Procedures (including critical care time)  Medications Ordered in ED Medications - No data to display   Initial Impression / Assessment and Plan / ED Course  I have reviewed the triage vital signs and the nursing notes.  Pertinent labs & imaging results that were available during my care of the patient were reviewed by me and considered in my medical decision making (see chart  for details).  71 year old male presents with acute onset of weakness/pre-syncope and was noted to be bradycardic. HR has fluctuated between 30-40s. His baseline is 50-60s. He is also noted to be hypertensive. Work up in the ED is overall unremarkable. EKG shows sinus bradycardia. CBC is normal. BMP is normal. Trop is normal. TSH is normal. CT head shows resolution of left sided hemorrhage. CXR is negative. Left hip xray is negative. Shared visit with Dr. Effie ShyWentz. Will admit for observation to ensure HR and BP are improved. Spoke with Dr. Talbert ForestShirley with FM who will come to admit.  Final Clinical Impressions(s) / ED Diagnoses   Final diagnoses:  Bradycardia    ED Discharge Orders    None       Bethel BornGekas, Dulcemaria Bula Marie, PA-C 08/07/17 1842    Mancel BaleWentz, Elliott, MD 08/07/17 2010

## 2017-08-07 NOTE — ED Notes (Signed)
Patient transported to CT 

## 2017-08-07 NOTE — ED Provider Notes (Signed)
  Face-to-face evaluation   History: He presents for evaluation of fall from chair.  He states he was not injured.  He does not express any other needs or desires.  Physical exam: Elderly man alert and cooperative.  He is bradycardic.  Blood pressure is elevated.  He moves arms and legs equally bilaterally without painful limitation.  Medical screening examination/treatment/procedure(s) were conducted as a shared visit with non-physician practitioner(s) and myself.  I personally evaluated the patient during the encounter    Mancel Bale, MD 08/07/17 2010

## 2017-08-07 NOTE — H&P (Signed)
Family Medicine Teaching [facility] Admission History and Physical Service Pager: [phone]  Patient name: Jordan Rowe Medical record number: [id number] Date of birth: [birth year] Age: 71 y.o. Gender: male  Primary Care Provider: Florentina Jenny, MD Consultants: none Code Status: Previously DNR, patient states on admission Full Code- unsure of mental capacity  Chief Complaint: unwitnessed fall   Assessment and Plan: Jordan Rowe is a 71 y.o. male presenting with weakness and bradycardia. PMH is significant for HTN, insulin-dependent type 2 DM, history of PAF, CHF with EF 50-55%, cognitive impairment, and schizophrenia.  Weakness: On admission patient hypertensive with bradycardia but asymptomatic and appears comfortable.  Reports generalized weakness which is likely due to bradycardia.  Uncertain of patient's baseline due to poor historian and psychiatric history however he does appear oriented.  Patient with unwitnessed fall this morning at his ALF where he says he was trying to sit down in a chair and fell back.  Per EMS, patient was walking with a walker and slumped down and fell on his left side.  Recently discharged from hospice due to improvement in EF. EKG with sinus bradycardia and prolonged QTC.  CBC and BMP normal.  istat troponin in ED 0.01 and TSH within normal limit.  CT of head showed resolution of left-sided hemorrhage.  Chest x-ray was negative.  Left hip x-ray is negative.  -Admit to telemetry for observation, Dr. Pollie Meyer- attending  -Cardiac monitoring  -AM CBC, BMET  -Mag, phos  -Orthostatic vitals  -PT/OT consult  -Measure intake and output  -Repeat echo -Vitals per unit  Bradycardia: Chronic. Baseline is 50s-60s.  Now in 40s in the ED and asymptomatic.  Have discussed with Cardiology who agreed to hold Amio and Carvedilol for now.  -Cardiology consulted, appreciate recs  -Continue Lasix, hold amiodarone and carvedilol in setting of bradycardia  -Repeat EKG in  the a.m.  -Trend troponins   HTN: Hypertensive upon admission to 170s (sys) and 90s (diast). On Coreg, Lasix, and amiodarone.  -Holding amiodarone and carvedilol due to symptomatic bradycardia -We will continue Lasix 20 mg daily  -PRN hydralazine with parameters for SBP>170  Insulin-dependent type 2 DM: Hemoglobin A1c 5.8 in May 2018.  Holding home Lantus for now.  -Sensitive sliding scale insulin  -Repeat hemoglobin A1c  -Carb mod/heart healthy diet  -Monitor CBGS  CHF with EF 50-55%: Last echo 12/2016 showing EF of 50-55% and grade 2 diastolic dysfunction.  Patient with no lower extremity edema or shortness of breath.  Lung exam without evidence of crackles of fluid overload.  -Repeat echo  -Monitor  Schizophrenia: Stable.  At home on Risperidone.  -Continue home medication  FEN/GI: Carb mod/heart healthy  Prophylaxis: Lovenox  Disposition: Admit to telemetry for observation  History of Present Illness:  Jordan Rowe is a 71 y.o. male presenting with weakness and bradycardia.  Lives at an ALF.  Earlier today patient had unwitnessed fall in his room. Patient reports that he was trying to sit down in a chair but fell.  Reports that he did not hit head or pass out.  Also noted patient is a poor/unreliable historian.  History of slow heart rate.  Patient was recently on hospice for CHF and bradycardia, but his EF improved and he recently was released from hospice per notes.  After being brought to the ED patient was found to be hypertensive to 173/98.  Denies chest pain, palpitations, leg swelling. Denies nausea, lightheadedness, dizziness.  Denies changes in vision or headache.  Patient reports that he has  been eating and drinking well over last few days. Reports some shortness of breath.  Uses 2L oxygen as needed.   Workup in the ED showed EKG with sinus bradycardia and prolonged QTC.  CBC normal.  BMP normal.  Troponin is normal.  TSH normal.  CT of head showed resolution of left-sided  hemorrhage.  Chest x-ray was negative.  Left hip x-ray is negative.  Patient remained hypertensive while in ED.  Heart rate remained in the 40s with presumed baseline of 50s to 60s.   Review Of Systems: Per HPI with the following additions:   Review of Systems  Eyes: Negative for blurred vision and double vision.  Respiratory: Positive for shortness of breath. Negative for wheezing.   Cardiovascular: Negative for chest pain, palpitations and leg swelling.  Gastrointestinal: Negative for nausea and vomiting.  Genitourinary: Positive for dysuria.  Musculoskeletal: Positive for falls.  Neurological: Positive for weakness. Negative for dizziness, loss of consciousness and headaches.   Patient Active Problem List   Diagnosis Date Noted  . Bradycardia 08/07/2017  . Chronic diastolic CHF (congestive heart failure) (HCC) 05/09/2017  . BPH (benign prostatic hyperplasia) 05/09/2017  . Subarachnoid bleed (HCC) 05/09/2017  . Traumatic closed fracture of C6 vertebra with minimal displacement, initial encounter (HCC) 05/08/2017  . Chronic anticoagulation 05/08/2017  . Hematoma of oral cavity 05/08/2017  . Fall   . Stroke-like symptom 01/13/2017  . NSVT (nonsustained ventricular tachycardia) (HCC) 10/05/2015  . Cardiomyopathy (HCC)   . Smoker 10/01/2015  . Schizophrenia (HCC)   . Diabetes mellitus type 2, controlled (HCC) 09/30/2015  . Atrial fibrillation (HCC) 09/30/2015  . Pain in the chest   . 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 Diabetes mellitus (HCC) 02/02/2013  . Dehydration 02/02/2013  . MENTAL RETARDATION 02/12/2009  . Essential hypertension 02/12/2009  . GERD 02/12/2009  . CONSTIPATION 02/12/2009   Past Medical History: Past Medical History:  Diagnosis Date  . Atrial fibrillation with RVR (HCC)    [name]/notes 09/30/2015  . BPH (benign prostatic hypertrophy)    [name]/notes 09/30/2015  . Constipation   . Edema   . GERD (gastroesophageal reflux disease)   . Glaucoma   . Hypertension   . Mental  retardation    [name]/notes 09/30/2015  . Schizophrenia (HCC)   . Stroke (HCC)   . Type II diabetes mellitus (HCC)    [name]/notes 09/30/2015  . Vitamin D deficiency    Past Surgical History: Past Surgical History:  Procedure Laterality Date  . CATARACT EXTRACTION Left 12/2002   [name]/notes 01/12/2011  . CATARACT EXTRACTION W/ INTRAOCULAR LENS IMPLANT Right 01/2011   [name]/notes 02/18/2011  . MULTIPLE TOOTH EXTRACTIONS      Social History: Social History   Tobacco Use  . Smoking status: Current Every Day Smoker  . Smokeless tobacco: Never Used  Substance Use Topics  . Alcohol use: No  . Drug use: No   Additional social history: Lives in ALF Please also refer to relevant sections of EMR.  Family History: Family History  Problem Relation Age of Onset  . Hypertension Mother    Allergies and Medications: No Known Allergies No current facility-administered medications on file prior to encounter.    Current Outpatient Medications on File Prior to Encounter  Medication Sig Dispense Refill  . acetaminophen (TYLENOL) 325 MG tablet Take 325-650 mg by mouth every 6 (six) hours as needed for mild pain or moderate pain.     Marland Kitchen. amiodarone (PACERONE) 200 MG tablet Take 1 tablet (200 mg total) by mouth  daily.    . bisacodyl (DULCOLAX) 10 MG suppository Place 10 mg rectally as needed for moderate constipation.    . carvedilol (COREG) 25 MG tablet Take 25 mg by mouth 2 (two) times daily with a meal.    . cholecalciferol (VITAMIN D) 1000 UNITS tablet Take 1,000 Units by mouth every morning.     . finasteride (PROSCAR) 5 MG tablet Take 5 mg by mouth every evening.     . furosemide (LASIX) 20 MG tablet Take 20 mg by mouth daily.    . insulin glargine (LANTUS) 100 UNIT/ML injection Inject 0.05 mLs (5 Units total) into the skin at bedtime. Hold is FSBS is less than 75 (Patient taking differently: Inject 8 Units into the skin at bedtime. Hold is FSBS is less than 75) 10 mL 11  . latanoprost (XALATAN) 0.005 % ophthalmic  solution Place 1 drop into both eyes at bedtime.    Marland Kitchen olopatadine (PATANOL) 0.1 % ophthalmic solution Place 1 drop into both eyes every morning.    Marland Kitchen omeprazole (PRILOSEC) 20 MG capsule Take 40 mg by mouth every evening. 30 min prior to evening meal.    . Polyvinyl Alcohol-Povidone (REFRESH OP) Place 1 drop into both eyes 2 (two) times daily.    . risperiDONE (RISPERDAL) 0.25 MG tablet Take 0.25 mg by mouth every morning.     . risperiDONE (RISPERDAL) 2 MG tablet Take 2 mg by mouth at bedtime.    . senna (SENOKOT) 8.6 MG TABS Take 1 tablet by mouth daily.     Marland Kitchen senna-docusate (SENOKOT-S) 8.6-50 MG tablet Take 2 tablets by mouth 2 (two) times daily.    . tamsulosin (FLOMAX) 0.4 MG CAPS capsule Take 1 capsule (0.4 mg total) by mouth daily. 30 capsule 0  . timolol (BETIMOL) 0.5 % ophthalmic solution Place 1 drop into both eyes daily.      Objective: BP (!) 158/87   Pulse (!) 41   Temp (!) 97.5 F (36.4 C) (Oral)   Resp 19   SpO2 99%    Exam: General: NAD, pleasant, easily awoken, WDWN  Eyes: PERRL, EOMI, no conjunctival pallor or injection, some clear discharge noted ENTM: Dry mucous membranes, no pharyngeal erythema or exudate Neck: Supple, no LAD, no JVD  Cardiovascular: Bradycardic with regular rhythm, no m/r/g, no LE edema, 2+ pedal pulses  Respiratory: CTA BL, normal work of breathing, no crackles or wheeze noted  Gastrointestinal: soft, nontender, mildly distended, normoactive BS MSK: moves 4 extremities equally Derm: no rashes appreciated Neuro: CN II-XII grossly intact Psych: AOx3, appropriate affect  Labs and Imaging: CBC BMET  Recent Labs  Lab 08/07/17 1631  WBC 6.1  HGB 15.6  HCT 45.4  PLT PLATELET CLUMPS NOTED ON SMEAR, COUNT APPEARS ADEQUATE   Recent Labs  Lab 08/07/17 1631  NA 140  K 4.3  CL 107  CO2 24  BUN 9  CREATININE 1.14  GLUCOSE 96  CALCIUM 8.9     TSH 0.472  [place] Shirley, DO PGY-1, [facility]    I have examined the  patient with Dr. Talbert Forest and agree with the above documentation.  I have included my edits in blue.   Freddrick March, MD 08/07/2017, 10:26 PM PGY-2, [facility] FPTS Intern pager: [phone], text pages welcome

## 2017-08-08 ENCOUNTER — Encounter (HOSPITAL_COMMUNITY): Payer: Self-pay | Admitting: *Deleted

## 2017-08-08 ENCOUNTER — Inpatient Hospital Stay (HOSPITAL_COMMUNITY): Payer: Medicare Other

## 2017-08-08 ENCOUNTER — Other Ambulatory Visit: Payer: Self-pay

## 2017-08-08 DIAGNOSIS — I351 Nonrheumatic aortic (valve) insufficiency: Secondary | ICD-10-CM

## 2017-08-08 DIAGNOSIS — R001 Bradycardia, unspecified: Principal | ICD-10-CM

## 2017-08-08 LAB — CBC
HEMATOCRIT: 42.9 % (ref 39.0–52.0)
Hemoglobin: 14.2 g/dL (ref 13.0–17.0)
MCH: 30.7 pg (ref 26.0–34.0)
MCHC: 33.1 g/dL (ref 30.0–36.0)
MCV: 92.7 fL (ref 78.0–100.0)
PLATELETS: 167 10*3/uL (ref 150–400)
RBC: 4.63 MIL/uL (ref 4.22–5.81)
RDW: 15 % (ref 11.5–15.5)
WBC: 6 10*3/uL (ref 4.0–10.5)

## 2017-08-08 LAB — BASIC METABOLIC PANEL
Anion gap: 8 (ref 5–15)
BUN: 8 mg/dL (ref 6–20)
CALCIUM: 8.6 mg/dL — AB (ref 8.9–10.3)
CO2: 25 mmol/L (ref 22–32)
Chloride: 106 mmol/L (ref 101–111)
Creatinine, Ser: 1.12 mg/dL (ref 0.61–1.24)
GFR calc Af Amer: >60 mL/min (ref >60)
GLUCOSE: 74 mg/dL (ref 65–99)
Potassium: 3.3 mmol/L — ABNORMAL LOW (ref 3.5–5.1)
Sodium: 139 mmol/L (ref 135–145)

## 2017-08-08 LAB — URINALYSIS, ROUTINE W REFLEX MICROSCOPIC
BACTERIA UA: NONE SEEN
Bilirubin Urine: NEGATIVE
Glucose, UA: NEGATIVE mg/dL
Ketones, ur: NEGATIVE mg/dL
Leukocytes, UA: NEGATIVE
Nitrite: NEGATIVE
PROTEIN: NEGATIVE mg/dL
SPECIFIC GRAVITY, URINE: 1.012 (ref 1.005–1.030)
SQUAMOUS EPITHELIAL / LPF: NONE SEEN
pH: 6 (ref 5.0–8.0)

## 2017-08-08 LAB — ECHOCARDIOGRAM COMPLETE
Ao-asc: 35 cm
EWDT: 327 ms
FS: 20 % — AB (ref 28–44)
Height: 69 in
IV/PV OW: 1.11
LA ID, A-P, ES: 38 mm
LA diam end sys: 38 mm
LA diam index: 1.93 cm/m2
LA vol A4C: 66.9 ml
LAVOL: 69.3 mL
LAVOLIN: 35.1 mL/m2
LDCA: 3.46 cm2
LVOTD: 21 mm
MV Dec: 327
MV pk E vel: 1.6 m/s
PW: 9 mm — AB (ref 0.6–1.1)
RV TAPSE: 20.4 mm
Reg peak vel: 209 cm/s
TR max vel: 209 cm/s
Weight: 2786.61 oz

## 2017-08-08 LAB — HEMOGLOBIN A1C
HEMOGLOBIN A1C: 6.7 % — AB (ref 4.8–5.6)
MEAN PLASMA GLUCOSE: 145.59 mg/dL

## 2017-08-08 LAB — GLUCOSE, CAPILLARY
GLUCOSE-CAPILLARY: 142 mg/dL — AB (ref 65–99)
GLUCOSE-CAPILLARY: 109 mg/dL — AB (ref 65–99)
GLUCOSE-CAPILLARY: 124 mg/dL — AB (ref 65–99)
Glucose-Capillary: 68 mg/dL (ref 65–99)
Glucose-Capillary: 92 mg/dL (ref 65–99)
Glucose-Capillary: 99 mg/dL (ref 65–99)

## 2017-08-08 LAB — TROPONIN I: Troponin I: <0.03 ng/mL (ref <0.03)

## 2017-08-08 LAB — MRSA PCR SCREENING: MRSA by PCR: NEGATIVE

## 2017-08-08 LAB — MAGNESIUM: MAGNESIUM: 1.9 mg/dL (ref 1.7–2.4)

## 2017-08-08 LAB — PHOSPHORUS: Phosphorus: 3.3 mg/dL (ref 2.5–4.6)

## 2017-08-08 MED ORDER — PERFLUTREN LIPID MICROSPHERE
1.0000 mL | INTRAVENOUS | Status: AC | PRN
  Filled 2017-08-08 (×2): qty 10

## 2017-08-08 MED ORDER — POTASSIUM CHLORIDE CRYS ER 20 MEQ PO TBCR
40.0000 meq | EXTENDED_RELEASE_TABLET | Freq: Two times a day (BID) | ORAL | Status: DC
  Administered 2017-08-08: 40 meq via ORAL
  Filled 2017-08-08: qty 2

## 2017-08-08 NOTE — Progress Notes (Signed)
Pt low blood sugar. Pt nonsymptomatic. Pt given orange juice. Will re check CBG in 20 minutes

## 2017-08-08 NOTE — Discharge Summary (Signed)
Family Medicine Teaching [facility] Discharge Summary  Patient name: Jordan Rowe Medical record number: [id number] Date of birth: 05/01/1946 Age: 71 y.o. Gender: male Date of Admission: 08/07/2017  Date of Discharge: 08/10/17  Admitting Physician: Latrelle Dodrill, MD  Primary Care Provider: No primary care provider on file. Consultants: Cardiology   Indication for Hospitalization: Bradycardia and weakness after fall  Discharge Diagnoses/Problem List:  Generalized Weakness Asymptomatic Bradycardia Hypertension Insulin-dependent type 2 DM, well controlled CHF with EF 50-55% Schizophrenia   Disposition: SNF  Discharge Condition: Stable  Discharge Exam: see progress note from day of discharge  Brief Hospital Course:  Jordan Rowe is a 71 year old male who presented with weakness and bradycardia.  He lives in an ALF.  Patient has a history of slow heart rate and was recently on hospice for CHF and bradycardia but his EF improved and was recently released.  After being brought to the ED patient was found to be hypertensive to 173/98.  Workup in the ED showed EKG was sinus bradycardia and prolonged QTC.  CBC was normal, BMP was normal, troponin was normal, and TSH was normal.  CT of the head showed resolution of the left-sided hemorrhage.  Chest x-ray was negative.  Left hip x-ray was negative.  Patient remained hypertensive while in the ED and throughout his stay.  After admission amiodarone and carvedilol were held due to his bradycardia.  Repeat EKG the next morning showed sinus bradycardia with improved QTC.  Cardiology was consulted and followed along during patient stay.  Lasix was continued.  On day 2 of stay patient was noted to be in his baseline heart rate range of 50s-60s.  Cardiology recommended that patient start Cozaar 25 mg daily and follow-up with them and that he would not need a pacer.  A repeat echo was performed while patient was admitted which showed little  change from previous echo in May 2018.  Prior to discharge patient was stable and back at baseline heart rate of 50s-60s.  Issues for Follow Up:  1. Patient will need cardiology follow-up, started on Cozaar 25 mg daily and discontinued patient's amiodarone and carvedilol during stay due to bradycardia.  2. Patient with recent history of falls and weakness, PT and OT recommending SNF. 3. Patient will need repeat BMP to evaluate kidney function after starting Cozaar.  Significant Procedures: none  Significant Labs and Imaging:  Recent Labs  Lab 08/07/17 1631 08/08/17 0450  WBC 6.1 6.0  HGB 15.6 14.2  HCT 45.4 42.9  PLT PLATELET CLUMPS NOTED ON SMEAR, COUNT APPEARS ADEQUATE 167   Recent Labs  Lab 08/07/17 1631 08/08/17 0042 08/08/17 0450 08/09/17 0550 08/10/17 0607  NA 140  --  139 138 139  K 4.3  --  3.3* 3.8 3.8  CL 107  --  106 104 105  CO2 24  --  25 26 26   GLUCOSE 96  --  74 88 108*  BUN 9  --  8 11 16   CREATININE 1.14  --  1.12 1.25* 1.20  CALCIUM 8.9  --  8.6* 8.7* 8.8*  MG  --  1.9  --   --   --   PHOS  --  3.3  --   --   --    TSH 0.472  Results/Tests Pending at Time of Discharge: none  Discharge Medications:  Allergies as of 08/11/2017   No Known Allergies     Medication List    STOP taking these medications   amiodarone 200  MG tablet Commonly known as:  PACERONE   carvedilol 25 MG tablet Commonly known as:  COREG     TAKE these medications   acetaminophen 325 MG tablet Commonly known as:  TYLENOL Take 325-650 mg by mouth every 6 (six) hours as needed for mild pain or moderate pain.   bisacodyl 10 MG suppository Commonly known as:  DULCOLAX Place 10 mg rectally as needed for moderate constipation.   cholecalciferol 1000 units tablet Commonly known as:  VITAMIN D Take 1,000 Units by mouth every morning.   finasteride 5 MG tablet Commonly known as:  PROSCAR Take 5 mg by mouth every evening.   furosemide 20 MG tablet Commonly known as:   LASIX Take 20 mg by mouth daily.   insulin glargine 100 UNIT/ML injection Commonly known as:  LANTUS Inject 0.05 mLs (5 Units total) into the skin at bedtime. Hold is FSBS is less than 75 What changed:    how much to take  additional instructions   latanoprost 0.005 % ophthalmic solution Commonly known as:  XALATAN Place 1 drop into both eyes at bedtime.   losartan 25 MG tablet Commonly known as:  COZAAR Take 1 tablet (25 mg total) by mouth daily.   olopatadine 0.1 % ophthalmic solution Commonly known as:  PATANOL Place 1 drop into both eyes every morning.   omeprazole 20 MG capsule Commonly known as:  PRILOSEC Take 40 mg by mouth every evening. 30 min prior to evening meal.   REFRESH OP Place 1 drop into both eyes 2 (two) times daily.   RISPERDAL 0.25 MG tablet Generic drug:  risperiDONE Take 0.25 mg by mouth every morning.   risperiDONE 2 MG tablet Commonly known as:  RISPERDAL Take 2 mg by mouth at bedtime.   senna 8.6 MG Tabs tablet Commonly known as:  SENOKOT Take 1 tablet by mouth daily.   senna-docusate 8.6-50 MG tablet Commonly known as:  Senokot-S Take 2 tablets by mouth 2 (two) times daily.   tamsulosin 0.4 MG Caps capsule Commonly known as:  FLOMAX Take 1 capsule (0.4 mg total) by mouth daily.   timolol 0.5 % ophthalmic solution Commonly known as:  BETIMOL Place 1 drop into both eyes daily.       Discharge Instructions: Please refer to Patient Instructions section of EMR for full details.  Patient was counseled important signs and symptoms that should prompt return to medical care, changes in medications, dietary instructions, activity restrictions, and follow up appointments.   Follow-Up Appointments: Follow-up Information    Lennette BihariKelly, Thomas A, MD. Schedule an appointment as soon as possible for a visit in 1 week(s).   Specialty:  Cardiology Contact information: [address] [place] [address] [phone]            Arlyce HarmanLockamy, Zyair Russi, DO 08/11/2017, 12:42 PM PGY-1, [facility]

## 2017-08-08 NOTE — Progress Notes (Signed)
Family Medicine Teaching Service Daily Progress Note Intern Pager: [phone]  Patient name: Jordan Rowe Medical record number: [id number] Date of birth: March 14, 1946 Age: 71 y.o. Gender: male  Primary Care Provider: No primary care provider on file. Consultants: None Code Status: Previously DNR, patient states on admission Full Code- unsure of medical capacity   Pt Overview and Major Events to Date:  Jordan Rowe is a 71 yo male presenting with weakness and bradycardia.  Assessment and Plan:  Generalized Weakness:  Reports generalized weakness which is likely due to bradycardia, normal appetite and diet. Uncertain of patient's baseline due to poor historian and psychiatric history, however he does appear oriented. EKG x2 with sinus bradycardia and improving prolonged QTC. Troponin has been neg x2 and TSH within normal limit.  -Cardiac monitoring  -Mag, phos wnl -Orthostatic vitals are wnl -PT/OT consult- recommend ALF -Measure intake and output   Bradycardia: Chronic, stable. Baseline is 50s-60s.  Now in 30s- 40s and asymptomatic.  Have discussed with Cardiology who agreed to hold Amio and Carvedilol for now and if there is no improvement with consult EP. -Cardiology consulted, appreciate recs  -Continue Lasix, hold amiodarone and carvedilol in setting of bradycardia  -Repeat EKG with no change -Troponins neg x2 - Repeat ECHO  HTN: Hypertensive with last BP of 154/76. On Coreg, Lasix, and amiodarone.  -Holding amiodarone and carvedilol due to symptomatic bradycardia -We will continue Lasix 20 mg daily  -PRN hydralazine with parameters for SBP>170  Insulin-dependent type 2 DM, well-controlled: Hemoglobin A1c 6.7.  Holding home Lantus for now.  -Sensitive sliding scale insulin  -Carb mod/heart healthy diet  -Monitor CBGS  CHF with EF 50-55%: Last echo 12/2016 showing EF of 50-55% and grade 2 diastolic dysfunction.  Patient with no lower extremity edema or shortness of  breath.  Lung exam without evidence of crackles of fluid overload.  -Repeat echo  -Monitor  Hypokalemia: 3.3 on 12/10. - 40 mEq of K-dur - monitor on BMP daily  Schizophrenia: Stable.  At home on Risperidone.  -Continue home medication  FEN/GI: Carb mod/heart healthy  Prophylaxis: Lovenox  Disposition: Back to ALF  Subjective:  Patient resting comfortably in bed.  States he does not have any pain or complaints.  He is just wondering when he can go back home.  Spoke with his brother who per the aunt and the chart is his point of contact.  His brother states that he was on hospice within the previous 2 days but was released due to his ejection fraction being improved.  He would like updates periodically and reports that his brother states that he would not want to be hooked up to a machine.  Brother says that he is able to communicate very well and is usually with it.  Appears to be at baseline.  Objective: Temp:  [97.4 F (36.3 C)-97.5 F (36.4 C)] 97.4 F (36.3 C) (12/10 0607) Pulse Rate:  [36-102] 37 (12/10 0607) Resp:  [10-20] 20 (12/09 2336) BP: (147-199)/(71-121) 156/71 (12/10 0607) SpO2:  [96 %-100 %] 96 % (12/10 0607) Weight:  [174 lb 2.6 oz (79 kg)] 174 lb 2.6 oz (79 kg) (12/09 2340) Physical Exam: General: NAD, resting in bed comfortably Cardiovascular: Bradycardic with regular rhythm, no MRG Respiratory: CTA BL, normal work of breathing Abdomen: Nontender Extremities: Moves all extremities equally, no lower extremity edema  Laboratory: Recent Labs  Lab 08/07/17 1631 08/08/17 0450  WBC 6.1 6.0  HGB 15.6 14.2  HCT 45.4 42.9  PLT PLATELET CLUMPS NOTED  ON SMEAR, COUNT APPEARS ADEQUATE 167   Recent Labs  Lab 08/07/17 1631 08/08/17 0450  NA 140 139  K 4.3 3.3*  CL 107 106  CO2 24 25  BUN 9 8  CREATININE 1.14 1.12  CALCIUM 8.9 8.6*  GLUCOSE 96 74   A1c 6.7 TSH 0.472  Imaging/Diagnostic Tests: Dg Chest 2 View  Result Date: 08/07/2017 CLINICAL DATA:   Pain following fall EXAM: CHEST  2 VIEW COMPARISON:  Jan 13, 2017 FINDINGS: There is no edema or consolidation. The heart is upper normal in size with pulmonary vascularity within normal limits. No adenopathy. There is calcification in the left anterior descending coronary artery. There is aortic atherosclerosis. There is degenerative change in thoracic spine. There old healed rib fractures on the right. IMPRESSION: Foci of left anterior descending coronary artery calcification. There is aortic atherosclerosis. There is no edema or consolidation. Aortic Atherosclerosis (ICD10-I70.0). Electronically Signed   By: Bretta Bang III M.D.   On: 08/07/2017 17:11   Ct Head Wo Contrast  Result Date: 08/07/2017 CLINICAL DATA:  Larey Seat backwards in room, bradycardia, hospice patient, unexplained altered level of consciousness EXAM: CT HEAD WITHOUT CONTRAST TECHNIQUE: Contiguous axial images were obtained from the base of the skull through the vertex without intravenous contrast. Sagittal and coronal MPR images reconstructed from axial data set. COMPARISON:  05/08/2017 FINDINGS: Brain: Generalized atrophy. Normal ventricular morphology. No midline shift or mass effect. Small vessel chronic ischemic changes of deep cerebral white matter. Old lacunar infarct RIGHT thalamus. Small old RIGHT frontal infarct. Resolution of the small new LEFT insular hemorrhage seen on the previous exam. No intracranial hemorrhage, mass lesion, evidence of acute infarction, or extra-axial fluid collection. Vascular: Extensive atherosclerotic calcification of internal carotid and vertebral arteries at skullbase Skull: No acute osseous abnormalities. Stable well-circumscribed lucency in the posterior RIGHT parietal bone near lambdoid suture. Sinuses/Orbits: Clear Other: N/A IMPRESSION: Atrophy with small vessel chronic ischemic changes of deep cerebral white matter. 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 No acute intracranial  abnormalities. Electronically Signed   By: Ulyses Southward M.D.   On: 08/07/2017 16:12   Dg Hip Unilat With Pelvis 2-3 Views Left  Result Date: 08/07/2017 CLINICAL DATA:  Larey Seat at nursing facility.  Bradycardia. EXAM: DG HIP (WITH OR WITHOUT PELVIS) 2-3V LEFT COMPARISON:  None. FINDINGS: There is no evidence of hip fracture or dislocation. 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 There is no evidence of arthropathy or other focal bone abnormality. Moderate aortoiliac calcifications. IMPRESSION: No acute fracture deformity or dislocation. Aortic Atherosclerosis (ICD10-I70.0). Electronically Signed   By: Awilda Metro M.D.   On: 08/07/2017 17:12    Octavious Zidek, [place], DO 08/08/2017, 7:18 AM PGY-1, [facility] FPTS Intern pager: [phone], text pages welcome

## 2017-08-08 NOTE — Evaluation (Signed)
Occupational Therapy Evaluation Patient Details Name: Jordan Rowe MRN: [id number] DOB: August 09, 1946 Today's Date: 08/08/2017    History of Present Illness [name] is a 71 y.o. male with a hx of schizophrenia, cognitive impairment, HTN, DM-2 insulin dependent, CHF with hx of EF 20-25% now improved to 50-55% in 2018 and PAF who is being seen today for the evaluation of bradycardia with generalized weakness and unwitnessed fall    Clinical Impression   Pt admitted with bradycardia after a fall. Pt currently with functional limitations due to the deficits listed below (see OT Problem List).  Pt will benefit from skilled OT to increase their safety and independence with ADL and functional mobility for ADL to facilitate discharge to venue listed below.      Follow Up Recommendations  SNF;Other (comment)(or HH at ALF)    Equipment Recommendations  None recommended by OT       Precautions / Restrictions Precautions Precautions: Fall Restrictions Weight Bearing Restrictions: No      Mobility Bed Mobility Overal bed mobility: Needs Assistance Bed Mobility: Supine to Sit;Sit to Supine     Supine to sit: Min guard Sit to supine: Min guard   General bed mobility comments: Close guard for safety as pt transitioned to/from EOB. Increased time required and heavy use of rails for support. Noted pt's L hand slipping off EOB as he was attempting to plant it to scoot his hips around fully to EOB.   Transfers Overall transfer level: Needs assistance Equipment used: Rolling walker (2 wheeled) Transfers: Sit to/from Stand Sit to Stand: Min assist         General transfer comment: Min assist provided for balance support as pt powered-up to full standing position. Increased time required for pt to gain and maintain upright posture.     Balance Overall balance assessment: Needs assistance Sitting-balance support: Feet supported;No upper extremity supported Sitting balance-Leahy  Scale: Fair     Standing balance support: Bilateral upper extremity supported;During functional activity Standing balance-Leahy Scale: Poor                             ADL either performed or assessed with clinical judgement   ADL Overall ADL's : Needs assistance/impaired Eating/Feeding: Set up;Sitting   Grooming: Sitting;Set up   Upper Body Bathing: Minimal assistance;Sitting   Lower Body Bathing: Moderate assistance;Sit to/from stand;Cueing for sequencing;Cueing for safety   Upper Body Dressing : Minimal assistance;Sitting   Lower Body Dressing: Moderate assistance;Cueing for safety       Toileting- Clothing Manipulation and Hygiene: Minimal assistance;Sit to/from stand;Cueing for sequencing;Cueing for safety         General ADL Comments: Discussed SNF versus ALF. Pt may benefit from increased rehab prior to DC back to ALF unless ALF able to provide increased A.      Vision Patient Visual Report: No change from baseline              Pertinent Vitals/Pain Pain Assessment: No/denies pain     Hand Dominance Right   Extremity/Trunk Assessment Upper Extremity Assessment Upper Extremity Assessment: LUE deficits/detail LUE Deficits / Details: Pt with generalized wakness. Noted pt used L hand to A with holding coffee but R hand did more of the work.  Pt reports having trouble with L UE since strole.   Lower Extremity Assessment Lower Extremity Assessment: LLE deficits/detail LLE Deficits / Details: Decreased strength and AROM consistent with residual weakness from prior stroke. Pt  with decreased DF and difficulty advancing LLE during gait cycle.    Cervical / Trunk Assessment Cervical / Trunk Assessment: Other exceptions Cervical / Trunk Exceptions: Forward head/rounded shoulder posture   Communication Communication Communication: Expressive difficulties(limited answers to questioning)   Cognition Arousal/Alertness: Awake/alert Behavior During  Therapy: WFL for tasks assessed/performed Overall Cognitive Status: History of cognitive impairments - at baseline                                                Home Living Family/patient expects to be discharged to:: Assisted living                             Home Equipment: Walker - 4 wheels   Additional Comments: Per pt someone helps with All ADLs including daily dressing and bathing 2 times a week. He does stand to shower typically.      Prior Functioning/Environment Level of Independence: Needs assistance  Gait / Transfers Assistance Needed: Pt reports he has been using a 4 wheeled walker and does not have access to a 2 wheeled walker              OT Problem List: Decreased strength;Impaired balance (sitting and/or standing);Decreased safety awareness      OT Treatment/Interventions: Self-care/ADL training;Patient/family education    OT Goals(Current goals can be found in the care plan section) Acute Rehab OT Goals Patient Stated Goal: "When can I go home?" OT Goal Formulation: With patient Time For Goal Achievement: 08/22/17 Potential to Achieve Goals: Good ADL Goals Pt Will Perform Grooming: with set-up;standing Pt Will Perform Upper Body Dressing: with set-up;sitting Pt Will Perform Lower Body Dressing: with supervision;sit to/from stand;sitting/lateral leans Pt Will Transfer to Toilet: with supervision;regular height toilet;ambulating Pt Will Perform Toileting - Clothing Manipulation and hygiene: with supervision;sit to/from stand  OT Frequency: Min 2X/week              AM-PAC PT "6 Clicks" Daily Activity     Outcome Measure Help from another person eating meals?: None Help from another person taking care of personal grooming?: None Help from another person toileting, which includes using toliet, bedpan, or urinal?: A Little Help from another person bathing (including washing, rinsing, drying)?: A Little Help from another  person to put on and taking off regular upper body clothing?: A Little Help from another person to put on and taking off regular lower body clothing?: A Lot 6 Click Score: 19   End of Session Equipment Utilized During Treatment: Rolling walker Nurse Communication: Mobility status  Activity Tolerance: Patient tolerated treatment well Patient left: in bed;with bed alarm set  OT Visit Diagnosis: Unsteadiness on feet (R26.81)                Time: [id number] OT Time Calculation (min): 30 min Charges:  OT General Charges $OT Visit: 1 Visit OT Evaluation $OT Eval Moderate Complexity: 1 Mod OT Treatments $Self Care/Home Management : 8-22 mins G-Codes:     Lise AuerLori Abdoulie Tierce, OT [phone]  Einar CrowEDDING, Laramie Meissner D 08/08/2017, 11:43 AM

## 2017-08-08 NOTE — Evaluation (Signed)
Physical Therapy Evaluation Patient Details Name: Jordan MorasMichael Bathgate MRN: [id number] DOB: February 19, 1946 Today's Date: 08/08/2017   History of Present Illness  Pt is a 71 y/o male with a PMH significant for CVA 12/2016, SAH and C6 fx in 04/2017 s/p fall, schizophrenia, cognitive impairment, HTN, DMII, CHF with EF 20-25% now improved to 50-55% in 2018. He is admitted with generalized weakness and unwitnessed fall when trying to sit in the chair at ALF.   Clinical Impression  Pt admitted with above diagnosis. Pt currently with functional limitations due to the deficits listed below (see PT Problem List). At the time of PT eval pt was able to perform transfers and ambulation with gross min assist for balance support and safety. Pt reports he has been using a 4 wheeled walker at ALF and does not have access to a 2 wheeled RW at this time. Feel he will need the 2 wheeled walker for increased support.   If the ALF will be able to accommodate increased assistance for the pt, feel this is an appropriate d/c disposition with HHPT to follow up. If the ALF is not able to accommodate increased assistance, recommending SNF for continued therapy. Considering this is the pt's second fall in 3 months, want to ensure he will be receiving the necessary support upon d/c to prevent any future falls. Pt will benefit from skilled PT to increase their independence and safety with mobility to allow discharge to the venue listed below.       Follow Up Recommendations Return to ALF with HHPT vs SNF    Equipment Recommendations  Rolling walker with 5" wheels    Recommendations for Other Services       Precautions / Restrictions Precautions Precautions: Fall Restrictions Weight Bearing Restrictions: No      Mobility  Bed Mobility Overal bed mobility: Needs Assistance Bed Mobility: Supine to Sit;Sit to Supine     Supine to sit: Min guard Sit to supine: Min guard   General bed mobility comments: Close guard for  safety as pt transitioned to/from EOB. Increased time required and heavy use of rails for support. Noted pt's L hand slipping off EOB as he was attempting to plant it to scoot his hips around fully to EOB.   Transfers Overall transfer level: Needs assistance Equipment used: Rolling walker (2 wheeled) Transfers: Sit to/from Stand Sit to Stand: Min assist         General transfer comment: Min assist provided for balance support as pt powered-up to full standing position. Increased time required for pt to gain and maintain upright posture.   Ambulation/Gait Ambulation/Gait assistance: Min assist Ambulation Distance (Feet): 80 Feet Assistive device: Rolling walker (2 wheeled) Gait Pattern/deviations: Step-to pattern;Decreased step length - left;Decreased weight shift to left;Trunk flexed Gait velocity: Decreased Gait velocity interpretation: Below normal speed for age/gender General Gait Details: Assist for balance support and walker management. Pt was cued for closer walker proximity, improved posture, and general safety. Pt with decreased step length particularly on the L side and at times noted he was dragging the LLE behind him. Was able to correct and somewhat improve with cues.   Stairs            Wheelchair Mobility    Modified Rankin (Stroke Patients Only)       Balance Overall balance assessment: Needs assistance Sitting-balance support: Feet supported;No upper extremity supported Sitting balance-Leahy Scale: Fair     Standing balance support: Bilateral upper extremity supported;During functional activity Standing balance-Leahy Scale:  Poor                               Pertinent Vitals/Pain Pain Assessment: No/denies pain    Home Living Family/patient expects to be discharged to:: Assisted living               Home Equipment: Walker - 4 wheels Additional Comments: Per pt someone helps with All ADLs including daily dressing and bathing 2  times a week. He does stand to shower typically.    Prior Function Level of Independence: Needs assistance   Gait / Transfers Assistance Needed: Pt reports he has been using a 4 wheeled walker and does not have access to a 2 wheeled walker           Hand Dominance   Dominant Hand: Right    Extremity/Trunk Assessment   Upper Extremity Assessment Upper Extremity Assessment: Defer to OT evaluation;LUE deficits/detail LUE Deficits / Details: Noted difficulty planting L hand on bed to scoot hips. Please see OT eval for more detailed assessment.     Lower Extremity Assessment Lower Extremity Assessment: LLE deficits/detail LLE Deficits / Details: Decreased strength and AROM consistent with residual weakness from prior stroke. Pt with decreased DF and difficulty advancing LLE during gait cycle.     Cervical / Trunk Assessment Cervical / Trunk Assessment: Other exceptions Cervical / Trunk Exceptions: Forward head/rounded shoulder posture  Communication   Communication: Expressive difficulties(limited answers to questioning)  Cognition Arousal/Alertness: Awake/alert Behavior During Therapy: Flat affect Overall Cognitive Status: History of cognitive impairments - at baseline                                        General Comments      Exercises     Assessment/Plan    PT Assessment Patient needs continued PT services  PT Problem List Decreased strength;Decreased range of motion;Decreased activity tolerance;Decreased balance;Decreased mobility;Decreased knowledge of use of DME;Decreased safety awareness;Decreased knowledge of precautions;Pain       PT Treatment Interventions DME instruction;Gait training;Stair training;Functional mobility training;Therapeutic activities;Therapeutic exercise;Neuromuscular re-education;Patient/family education    PT Goals (Current goals can be found in the Care Plan section)  Acute Rehab PT Goals Patient Stated Goal: "When can  I go home?" PT Goal Formulation: With patient Time For Goal Achievement: 08/22/17 Potential to Achieve Goals: Good    Frequency Min 3X/week   Barriers to discharge        Co-evaluation               AM-PAC PT "6 Clicks" Daily Activity  Outcome Measure Difficulty turning over in bed (including adjusting bedclothes, sheets and blankets)?: A Little Difficulty moving from lying on back to sitting on the side of the bed? : Unable Difficulty sitting down on and standing up from a chair with arms (e.g., wheelchair, bedside commode, etc,.)?: Unable Help needed moving to and from a bed to chair (including a wheelchair)?: A Little Help needed walking in hospital room?: A Little Help needed climbing 3-5 steps with a railing? : Total 6 Click Score: 12    End of Session Equipment Utilized During Treatment: Gait belt Activity Tolerance: Patient tolerated treatment well Patient left: in bed;with call bell/phone within reach;with bed alarm set Nurse Communication: Mobility status PT Visit Diagnosis: Unsteadiness on feet (R26.81);History of falling (Z91.81)    Time: [phone]  PT Time Calculation (min) (ACUTE ONLY): 21 min   Charges:   PT Evaluation $PT Eval Moderate Complexity: 1 Mod     PT G Codes:        Conni Slipper, PT, DPT Acute Rehabilitation Services Pager: [phone]   Marylynn Pearson 08/08/2017, 10:44 AM

## 2017-08-08 NOTE — Consult Note (Addendum)
Cardiology Consultation:   Patient ID: Jordan Rowe; [id number]; 23-Jan-1946   Admit date: 08/07/2017 Date of Consult: 08/08/2017  Primary Care Provider: Florentina Jenny, MD Primary Cardiologist:  Dr. Tresa Endo from 2017 Primary Electrophysiologist:  Rudi Coco, NP-C   Patient Profile:   Jordan Rowe is a 71 y.o. male with a hx of schizophrenia, cognitive impairment, HTN, DM-2 insulin dependent, CHF with hx of EF 20-25% now improved to 50-55% in 2018 and PAF who is being seen today for the evaluation of bradycardia with generalized weakness and unwitnessed fall at the request of Dr. Pollie Meyer.  History of Present Illness:   Mr. Hamm has  a hx of schizophrenia, cognitive impairment, Hx CVA, HTN, DM-2 insulin dependent, CHF with hx of EF 20-25% now improved to 50-55% in 2018 and PAF followed by Rudi Coco in [facility] fib clinic but no follow up for HF.  On last visit HR was 50-SB.  Eliquis for chadsvasc score of at least 4.  Pt now admitted with generalized weakness and unwitnessed fall.   He was trying to sit in chair and fell.  Denies passing out, but is poor historian.  This AM he follows commands but falls back to sleep.   He had a neg nuc 10/2015 with EF <30%, no ischemia but small mid and basal inf wall infarct no ischemia.    In Sept 2018 pt presented with subarachnoid bleed and traumatic closed fracture of C6 vertebra with minimal displacement due to fall on 05/09/17.  Treated conservatively and Eliquis held.   He was DNR at that time.  Pt is on amiodarone 200 mg daily, coreg 25 mg BID,  Timolol opth drops, and Risperdal.    (+ prolonged Qtc with amiodarone and risperdal)   EKG on arrival with SB at 39, prolonged Qtc, LAD and lateral ST depression.   HR this AM on EKG of 41. I personally reviewed. TELE:  I personally reviewed.HR in mid 30s to 40s, some junctional.     Troponin neg with poc 0.01, and troponin I <0.03 X 2 Hgb 14.2 K+ 3.3 today, Cr 1.12 Mg+ 1.9 hgb A1C 6.7 TSH  0.472   CT head:  Atrophy with small vessel chronic ischemic changes of deep cerebral white matter.  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  No acute intracranial abnormalities.  CXR:  Foci of left anterior descending coronary artery calcification. There is aortic atherosclerosis. There is no edema or consolidation  Currently no complaints but very sleepy, wakes and follows commands. No chest pain.  No SOB.  HR still low. Amiodarone and coreg are held.   Past Medical History:  Diagnosis Date  . Atrial fibrillation with RVR (HCC)    [name] 09/30/2015  . BPH (benign prostatic hypertrophy)    [name] 09/30/2015  . Constipation   . Edema   . GERD (gastroesophageal reflux disease)   . Glaucoma   . Hypertension   . Mental retardation    [name] 09/30/2015  . Schizophrenia (HCC)   . Stroke (HCC)   . Type II diabetes mellitus (HCC)    [name] 09/30/2015  . Vitamin D deficiency     Past Surgical History:  Procedure Laterality Date  . CATARACT EXTRACTION Left 12/2002   [name] 01/12/2011  . CATARACT EXTRACTION W/ INTRAOCULAR LENS IMPLANT Right 01/2011   [name] 02/18/2011  . MULTIPLE TOOTH EXTRACTIONS       Home Medications:  Prior to Admission medications   Medication Sig Start Date End Date Taking? Authorizing Provider  acetaminophen (TYLENOL) 325 MG tablet Take 325-650 mg by mouth every 6 (six) hours as needed for mild pain or moderate pain.    Yes [provider]  amiodarone (PACERONE) 200 MG tablet Take 1 tablet (200 mg total) by mouth daily. 10/30/15  Yes Newman Niparroll, Donna C, NP  bisacodyl (DULCOLAX) 10 MG suppository Place 10 mg rectally as needed for moderate constipation.   Yes [provider]  carvedilol (COREG) 25 MG tablet Take 25 mg by mouth 2 (two) times daily with a meal.   Yes [provider]  cholecalciferol (VITAMIN D) 1000 UNITS tablet Take 1,000 Units by mouth every morning.    Yes [provider]  finasteride (PROSCAR) 5 MG  tablet Take 5 mg by mouth every evening.    Yes [provider]  furosemide (LASIX) 20 MG tablet Take 20 mg by mouth daily.   Yes [provider]  insulin glargine (LANTUS) 100 UNIT/ML injection Inject 0.05 mLs (5 Units total) into the skin at bedtime. Hold is FSBS is less than 75 Patient taking differently: Inject 8 Units into the skin at bedtime. Hold is FSBS is less than 75 10/07/15  Yes Lendell CapriceSullivan, Corinna L, MD  latanoprost (XALATAN) 0.005 % ophthalmic solution Place 1 drop into both eyes at bedtime.   Yes [provider]  olopatadine (PATANOL) 0.1 % ophthalmic solution Place 1 drop into both eyes every morning.   Yes [provider]  omeprazole (PRILOSEC) 20 MG capsule Take 40 mg by mouth every evening. 30 min prior to evening meal.   Yes [provider]  Polyvinyl Alcohol-Povidone (REFRESH OP) Place 1 drop into both eyes 2 (two) times daily.   Yes [provider]  risperiDONE (RISPERDAL) 0.25 MG tablet Take 0.25 mg by mouth every morning.    Yes [provider]  risperiDONE (RISPERDAL) 2 MG tablet Take 2 mg by mouth at bedtime.   Yes [provider]  senna (SENOKOT) 8.6 MG TABS Take 1 tablet by mouth daily.    Yes [provider]  senna-docusate (SENOKOT-S) 8.6-50 MG tablet Take 2 tablets by mouth 2 (two) times daily.   Yes [provider]  tamsulosin (FLOMAX) 0.4 MG CAPS capsule Take 1 capsule (0.4 mg total) by mouth daily. 10/22/16  Yes West, Emily, PA-C  timolol (BETIMOL) 0.5 % ophthalmic solution Place 1 drop into both eyes daily.   Yes [provider]    Inpatient Medications: Scheduled Meds: . docusate sodium  100 mg Oral BID  . enoxaparin (LOVENOX) injection  40 mg Subcutaneous Q24H  . finasteride  5 mg Oral QPM  . furosemide  20 mg Oral Daily  . insulin aspart  0-9 Units Subcutaneous TID WC  . latanoprost  1 drop Both Eyes QHS  . risperiDONE  0.25 mg Oral Daily  . risperiDONE  2 mg  Oral QHS  . sodium chloride flush  3 mL Intravenous Q12H  . tamsulosin  0.4 mg Oral Daily   Continuous Infusions: . sodium chloride     PRN Meds: sodium chloride, acetaminophen **OR** acetaminophen, hydrALAZINE, sodium chloride flush  Allergies:   No Known Allergies  Social History:   Social History   Socioeconomic History  . Marital status: Divorced    Spouse name: Not on file  . Number of children: Not on file  . Years of education: Not on file  . Highest education level: Not on file  Social Needs  . Financial resource strain: Not on file  .  Food insecurity - worry: Not on file  . Food insecurity - inability: Not on file  . Transportation needs - medical: Not on file  . Transportation needs - non-medical: Not on file  Occupational History  . Not on file  Tobacco Use  . Smoking status: Current Every Day Smoker  . Smokeless tobacco: Never Used  Substance and Sexual Activity  . Alcohol use: No  . Drug use: No  . Sexual activity: Not on file  Other Topics Concern  . Not on file  Social History Narrative  . Not on file    Family History:    Family History  Problem Relation Age of Onset  . Hypertension Mother      ROS:  Please see the history of present illness.  ROS  General:no colds or fevers, no weight changes Skin:no rashes or ulcers HEENT:no blurred vision, no congestion CV:see HPI PUL:see HPI GI:no diarrhea constipation or melena, no indigestion GU:no hematuria, no dysuria MS:no joint pain, no claudication- has falls Neuro:no syncope, no lightheadedness Endo:+ diabetes on insulin, no thyroid disease   Physical Exam/Data:   Vitals:   08/07/17 2300 08/07/17 2336 08/07/17 2340 08/08/17 0607  BP: (!) 147/78 (!) 171/89 (!) 177/97 (!) 156/71  Pulse: (!) 46 (!) 40 (!) 45 (!) 37  Resp: 15 20    Temp:    (!) 97.4 F (36.3 C)  TempSrc:  Oral  Oral  SpO2: 99%   96%  Weight:   174 lb 2.6 oz (79 kg)   Height:   5\' 9"  (1.753 m)     Intake/Output  Summary (Last 24 hours) at 08/08/2017 0659 Last data filed at 08/07/2017 2300 Gross per 24 hour  Intake -  Output 400 ml  Net -400 ml   Filed Weights   08/07/17 2340  Weight: 174 lb 2.6 oz (79 kg)   Body mass index is 25.72 kg/m.  General:  Well nourished, well developed, in no acute distress answers questions yes or no. HEENT: normal Lymph: no adenopathy Neck: no JVD Endocrine:  No thryomegaly Vascular: No carotid bruits; 1+ pedal pulses  Cardiac:  normal S1, S2; RRR; no murmur gallup rub or click slow HR Lungs:  clear to auscultation bilaterally, no wheezing, rhonchi or rales  Abd: soft, nontender, no hepatomegaly  Ext: no edema Musculoskeletal:  No deformities, BUE and BLE strength normal and equal Skin: warm and dry  Neuro:  Follows commands answers with yes or no. Cognitive dysfuncion Psych:  Normal affect    Relevant CV Studies: Last echo 01/14/17 Study Conclusions  - Left ventricle: The cavity size was normal. Wall thickness was   normal. Systolic function was normal. The estimated ejection   fraction was in the range of 50% to 55%. Wall motion was normal;   there were no regional wall motion abnormalities. Features are   consistent with a pseudonormal left ventricular filling pattern,   with concomitant abnormal relaxation and increased filling   pressure (grade 2 diastolic dysfunction). - Left atrium: The atrium was mildly dilated.  Impressions:  - Normal LV systolic function; moderate diastolic dysfunction; mild   LAE; mild TR.  Laboratory Data:  Chemistry Recent Labs  Lab 08/07/17 1631 08/08/17 0450  NA 140 139  K 4.3 3.3*  CL 107 106  CO2 24 25  GLUCOSE 96 74  BUN 9 8  CREATININE 1.14 1.12  CALCIUM 8.9 8.6*  GFRNONAA >60 >60  GFRAA >60 >60  ANIONGAP 9 8    No  results for input(s): PROT, ALBUMIN, AST, ALT, ALKPHOS, BILITOT in the last 168 hours. Hematology Recent Labs  Lab 08/07/17 1631 08/08/17 0450  WBC 6.1 6.0  RBC 4.92 4.63   HGB 15.6 14.2  HCT 45.4 42.9  MCV 92.3 92.7  MCH 31.7 30.7  MCHC 34.4 33.1  RDW 14.5 15.0  PLT PLATELET CLUMPS NOTED ON SMEAR, COUNT APPEARS ADEQUATE 167   Cardiac Enzymes Recent Labs  Lab 08/08/17 0042 08/08/17 0450  TROPONINI <0.03 <0.03    Recent Labs  Lab 08/07/17 1644  TROPIPOC 0.01    BNPNo results for input(s): BNP, PROBNP in the last 168 hours.  DDimer No results for input(s): DDIMER in the last 168 hours.  Radiology/Studies:  Dg Chest 2 View  Result Date: 08/07/2017 CLINICAL DATA:  Pain following fall EXAM: CHEST  2 VIEW COMPARISON:  Jan 13, 2017 FINDINGS: There is no edema or consolidation. The heart is upper normal in size with pulmonary vascularity within normal limits. No adenopathy. There is calcification in the left anterior descending coronary artery. There is aortic atherosclerosis. There is degenerative change in thoracic spine. There old healed rib fractures on the right. IMPRESSION: Foci of left anterior descending coronary artery calcification. There is aortic atherosclerosis. There is no edema or consolidation. Aortic Atherosclerosis (ICD10-I70.0). Electronically Signed   By: Bretta Bang III M.D.   On: 08/07/2017 17:11   Ct Head Wo Contrast  Result Date: 08/07/2017 CLINICAL DATA:  Larey Seat backwards in room, bradycardia, hospice patient, unexplained altered level of consciousness EXAM: CT HEAD WITHOUT CONTRAST TECHNIQUE: Contiguous axial images were obtained from the base of the skull through the vertex without intravenous contrast. Sagittal and coronal MPR images reconstructed from axial data set. COMPARISON:  05/08/2017 FINDINGS: Brain: Generalized atrophy. Normal ventricular morphology. No midline shift or mass effect. Small vessel chronic ischemic changes of deep cerebral white matter. Old lacunar infarct RIGHT thalamus. Small old RIGHT frontal infarct. Resolution of the small new LEFT insular hemorrhage seen on the previous exam. No intracranial  hemorrhage, mass lesion, evidence of acute infarction, or extra-axial fluid collection. Vascular: Extensive atherosclerotic calcification of internal carotid and vertebral arteries at skullbase Skull: No acute osseous abnormalities. Stable well-circumscribed lucency in the posterior RIGHT parietal bone near lambdoid suture. Sinuses/Orbits: Clear Other: N/A IMPRESSION: Atrophy with small vessel chronic ischemic changes of deep cerebral white matter. 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 No acute intracranial abnormalities. Electronically Signed   By: Ulyses Southward M.D.   On: 08/07/2017 16:12   Dg Hip Unilat With Pelvis 2-3 Views Left  Result Date: 08/07/2017 CLINICAL DATA:  Larey Seat at nursing facility.  Bradycardia. EXAM: DG HIP (WITH OR WITHOUT PELVIS) 2-3V LEFT COMPARISON:  None. FINDINGS: There is no evidence of hip fracture or dislocation. 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 There is no evidence of arthropathy or other focal bone abnormality. Moderate aortoiliac calcifications. IMPRESSION: No acute fracture deformity or dislocation. Aortic Atherosclerosis (ICD10-I70.0). Electronically Signed   By: Awilda Metro M.D.   On: 08/07/2017 17:12    Assessment and Plan:   1. Sinus brady to junctional- amiodarone and coreg held.  Let this wash out if no improvement by tomorrow EP consult  2. PAF has been maintaining SB in the 50s.  [id number] score of 4 but with IC hemorrhage no anticoagulation 3. Hx of NICM but improvement of EF to 50%  Echo has been ordered 4. Chronic diastolic HF Euvolemic 5. Hx of CVA 12/2016  6. Hx schizophrenia  on Risperdal  7.  Hx congnitive disorder.    For questions or updates, please contact [facility] Please consult [email or web address] for contact info under Cardiology/STEMI.   Signed, Nada Boozer, NP  08/08/2017 6:59 AM   Patient examined chart reviewed. Lethargic black male Thyroid scar neck soft SEM Telemetry with SB rates low  50's to upper 40's no AV block Amiodarone and coreg held yesterday. No anticoagulation due to fall risk and history of IC hemorrhage. No signs of CHF history of DCM lstes EF 50% echo pending. Suspect he will not need pacer when meds out of system. Still lethargic and weak doubt it has anything to do with rhythm  Charlton Haws

## 2017-08-08 NOTE — Progress Notes (Signed)
Nutrition Brief Note  Patient identified on the Malnutrition Screening Tool (MST) Report. 71 yo admitted with weakness, bradycardia. Pt with hx of CVA, schizophrenia, cognitive impairment, CHF EF 20-25% now improved to 50-55%, HTN. Pt resides at ALF  Wt Readings from Last 15 Encounters:  08/07/17 174 lb 2.6 oz (79 kg)  05/09/17 169 lb 12.1 oz (77 kg)  04/21/17 174 lb (78.9 kg)  01/15/17 165 lb (74.8 kg)  01/13/17 165 lb (74.8 kg)  10/22/16 165 lb (74.8 kg)  10/21/16 165 lb 9.6 oz (75.1 kg)  04/30/16 175 lb 6.4 oz (79.6 kg)  12/03/15 187 lb 6.4 oz (85 kg)  10/30/15 191 lb 12.8 oz (87 kg)  10/07/15 195 lb 6.4 oz (88.6 kg)  02/02/13 214 lb 1.1 oz (97.1 kg)    Body mass index is 25.72 kg/m.   Current diet order is Heart Healthy/Carb Modified, patient is consuming approximately 100% of meals at this time. Labs and medications reviewed.   Nutrition-Focused physical exam completed. Findings are WDL for fat depletion, muscle depletion, and edema.   Pt not appropriate for diet education at this time due to cognitive impairment, resides at ALF  No nutrition interventions warranted at this time. If nutrition issues arise, please consult RD.   Romelle Starcher MS, RD, LDN, CNSC [phone] Pager  [phone] Weekend/On-Call Pager

## 2017-08-08 NOTE — Progress Notes (Signed)
  Echocardiogram 2D Echocardiogram has been performed.  Jordan Rowe 08/08/2017, 12:45 PM

## 2017-08-08 NOTE — Progress Notes (Signed)
Vaccinations verified by Erenest Rasher, Medication Technician at [facility].

## 2017-08-09 LAB — GLUCOSE, CAPILLARY
GLUCOSE-CAPILLARY: 109 mg/dL — AB (ref 65–99)
GLUCOSE-CAPILLARY: 120 mg/dL — AB (ref 65–99)
Glucose-Capillary: 84 mg/dL (ref 65–99)
Glucose-Capillary: 137 mg/dL — ABNORMAL HIGH (ref 65–99)

## 2017-08-09 LAB — BASIC METABOLIC PANEL
ANION GAP: 8 (ref 5–15)
BUN: 11 mg/dL (ref 6–20)
CALCIUM: 8.7 mg/dL — AB (ref 8.9–10.3)
CO2: 26 mmol/L (ref 22–32)
Chloride: 104 mmol/L (ref 101–111)
Creatinine, Ser: 1.25 mg/dL — ABNORMAL HIGH (ref 0.61–1.24)
GFR, EST NON AFRICAN AMERICAN: 57 mL/min — AB (ref >60)
Glucose, Bld: 88 mg/dL (ref 65–99)
Potassium: 3.8 mmol/L (ref 3.5–5.1)
SODIUM: 138 mmol/L (ref 135–145)

## 2017-08-09 LAB — URINE CULTURE: Culture: <10000 — AB

## 2017-08-09 MED ORDER — LOSARTAN POTASSIUM 25 MG PO TABS
25.0000 mg | ORAL_TABLET | Freq: Every day | ORAL | Status: DC
  Administered 2017-08-09 – 2017-08-11 (×3): 25 mg via ORAL
  Filled 2017-08-09 (×3): qty 1

## 2017-08-09 NOTE — Discharge Instructions (Signed)
It was a pleasure caring for you during your hospital stay! You were admitted for bradycardia and weakness after a fall. We held your coreg and amiodarone and your bradycardia improved.  We have discontinued your Coreg and amiodarone after discharge.  You will be started on Cozaar.

## 2017-08-09 NOTE — Progress Notes (Signed)
Family Medicine Teaching Service Daily Progress Note Intern Pager: [phone]  Patient name: Jordan Rowe Medical record number: [id number] Date of birth: [birth year] Age: 71 y.o. Gender: male  Primary Care Provider: No primary care provider on file. Consultants: None Code Status: Previously DNR, patient states on admission Full Code- unsure of medical capacity   Pt Overview and Major Events to Date:  Jordan Rowe is a 71 yo male presenting with weakness and bradycardia.  Assessment and Plan:  Bradycardia:  Improving.  Chronic, stable.  Patient is now at his baseline of 50s-60s.  Asymptomatic. -Cardiology is following, appreciate recommendations -Continue Lasix, holding amiodarone and carvedilol - Echo: The estimated ejection fraction was in the range of 45% to 50%. Mild diffuse hypokinesis. Features are consistent with a pseudonormal left ventricular filling pattern, with concomitant abnormal relaxation and increased filling pressure (grade 2 diastolic dysfunction).  Generalized weakness:  Reports generalized weakness but with normal appetite and diet.  Uncertain of patient's baseline due to poor historian and psychiatric history.  -Orthostatic vitals were within normal limits -PT/OT have been consulted I recommend patient to return to his ALF 1 -Nutrition consulted and does not think he needs further evaluation  HTN:  Hypertensive with last BP of 150/79.   -Continue Lasix, holding amiodarone and carvedilol due to bradycardia -PRN hydralazine with parameters for SBP >170  Insulin-dependent type 2 DM, well controlled: Hemoglobin A1c 6.7.    Holding home Lantus. -Sensitive sliding scale insulin  -Heart modified/heart healthy diet -Monitor CBGs  CHF with EF 50-55%: Echo: The estimated EF was in the range of 45-50%. Mild diffuse hypokinesis. Features are consistent with a pseudonormal left ventricular filling pattern, with concomitant abnormal relaxation and increased filling  pressure (grade 2 diastolic dysfunction). Patient with no lower extremity edema or shortness of breath.  Lung exam without evidence of crackles or fluid overload -last echo 12/2016 showing EF of 50-55% and grade 2 diastolic dysfunction  Hypokalemia: Resolved. 3.8 on 12/11. - monitor on BMP daily  Schizophrenia: Stable.  At home on Risperidone.  -Continue home medication  FEN/GI: Carb mod/heart healthy  Prophylaxis: Lovenox  Disposition: Back to ALF when cleared by cardiology  Subjective:  Patient resting comfortably in bed and on the phone.  Patient is not having any complaints and would like to go home.  Objective: Temp:  [98.2 F (36.8 C)-98.6 F (37 C)] 98.6 F (37 C) (12/11 0032) Pulse Rate:  [45-57] 51 (12/11 0414) Resp:  [18] 18 (12/11 0414) BP: (143-171)/(82-101) 143/82 (12/11 0414) SpO2:  [97 %-100 %] 97 % (12/11 0414) Weight:  [176 lb 5.9 oz (80 kg)] 176 lb 5.9 oz (80 kg) (12/11 0414) Physical Exam: General: NAD, resting in bed comfortably Cardiovascular: Bradycardic with regular rhythm, no MRG Respiratory: CTA BL, normal work of breathing Abdomen: Nontender Extremities: Moves all extremities equally, no lower extremity edema  Laboratory: Recent Labs  Lab 08/07/17 1631 08/08/17 0450  WBC 6.1 6.0  HGB 15.6 14.2  HCT 45.4 42.9  PLT PLATELET CLUMPS NOTED ON SMEAR, COUNT APPEARS ADEQUATE 167   Recent Labs  Lab 08/07/17 1631 08/08/17 0450 08/09/17 0550  NA 140 139 138  K 4.3 3.3* 3.8  CL 107 106 104  CO2 24 25 26   BUN 9 8 11   CREATININE 1.14 1.12 1.25*  CALCIUM 8.9 8.6* 8.7*  GLUCOSE 96 74 88   A1c 6.7 TSH 0.472  Imaging/Diagnostic Tests: Dg Chest 2 View  Result Date: 08/07/2017 CLINICAL DATA:  Pain following fall EXAM: CHEST  2  VIEW COMPARISON:  Jan 13, 2017 FINDINGS: There is no edema or consolidation. The heart is upper normal in size with pulmonary vascularity within normal limits. No adenopathy. There is calcification in the left anterior  descending coronary artery. There is aortic atherosclerosis. There is degenerative change in thoracic spine. There old healed rib fractures on the right. IMPRESSION: Foci of left anterior descending coronary artery calcification. There is aortic atherosclerosis. There is no edema or consolidation. Aortic Atherosclerosis (ICD10-I70.0). Electronically Signed   By: Bretta BangWilliam  Woodruff III M.D.   On: 08/07/2017 17:11   Ct Head Wo Contrast  Result Date: 08/07/2017 CLINICAL DATA:  Larey SeatFell backwards in room, bradycardia, hospice patient, unexplained altered level of consciousness EXAM: CT HEAD WITHOUT CONTRAST TECHNIQUE: Contiguous axial images were obtained from the base of the skull through the vertex without intravenous contrast. Sagittal and coronal MPR images reconstructed from axial data set. COMPARISON:  05/08/2017 FINDINGS: Brain: Generalized atrophy. Normal ventricular morphology. No midline shift or mass effect. Small vessel chronic ischemic changes of deep cerebral white matter. Old lacunar infarct RIGHT thalamus. Small old RIGHT frontal infarct. Resolution of the small new LEFT insular hemorrhage seen on the previous exam. No intracranial hemorrhage, mass lesion, evidence of acute infarction, or extra-axial fluid collection. Vascular: Extensive atherosclerotic calcification of internal carotid and vertebral arteries at skullbase Skull: No acute osseous abnormalities. Stable well-circumscribed lucency in the posterior RIGHT parietal bone near lambdoid suture. Sinuses/Orbits: Clear Other: N/A IMPRESSION: Atrophy with small vessel chronic ischemic changes of deep cerebral white matter. 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 No acute intracranial abnormalities. Electronically Signed   By: Ulyses SouthwardMark  Boles M.D.   On: 08/07/2017 16:12   Dg Hip Unilat With Pelvis 2-3 Views Left  Result Date: 08/07/2017 CLINICAL DATA:  Larey SeatFell at nursing facility.  Bradycardia. EXAM: DG HIP (WITH OR WITHOUT PELVIS) 2-3V LEFT  COMPARISON:  None. FINDINGS: There is no evidence of hip fracture or dislocation. 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 There is no evidence of arthropathy or other focal bone abnormality. Moderate aortoiliac calcifications. IMPRESSION: No acute fracture deformity or dislocation. Aortic Atherosclerosis (ICD10-I70.0). Electronically Signed   By: Awilda Metroourtnay  Bloomer M.D.   On: 08/07/2017 17:12    Finleigh Cheong, [place], DO 08/09/2017, 7:43 AM PGY-1, [place] [facility] FPTS Intern pager: [phone], text pages welcome

## 2017-08-09 NOTE — Progress Notes (Signed)
Progress Note  Patient Name: Jordan MorasMichael Fahy Date of Encounter: 08/09/2017  Primary Cardiologist: Nicki Guadalajarahomas Kelly, MD   Subjective   No complaints   Inpatient Medications    Scheduled Meds: . docusate sodium  100 mg Oral BID  . enoxaparin (LOVENOX) injection  40 mg Subcutaneous Q24H  . finasteride  5 mg Oral QPM  . furosemide  20 mg Oral Daily  . insulin aspart  0-9 Units Subcutaneous TID WC  . latanoprost  1 drop Both Eyes QHS  . risperiDONE  0.25 mg Oral Daily  . risperiDONE  2 mg Oral QHS  . sodium chloride flush  3 mL Intravenous Q12H  . tamsulosin  0.4 mg Oral Daily   Continuous Infusions: . sodium chloride     PRN Meds: sodium chloride, acetaminophen **OR** acetaminophen, hydrALAZINE, sodium chloride flush   Vital Signs    Vitals:   08/08/17 1922 08/09/17 0032 08/09/17 0414 08/09/17 0749  BP: (!) 171/101 (!) 156/100 (!) 143/82 (!) 150/79  Pulse: (!) 51 (!) 57 (!) 51 (!) 50  Resp: 18 18 18 18   Temp: 98.2 F (36.8 C) 98.6 F (37 C)  98.4 F (36.9 C)  TempSrc: Oral Oral Oral Oral  SpO2: 100% 100% 97% 97%  Weight:   176 lb 5.9 oz (80 kg)   Height:        Intake/Output Summary (Last 24 hours) at 08/09/2017 0914 Last data filed at 08/09/2017 0420 Gross per 24 hour  Intake 1080 ml  Output 2675 ml  Net -1595 ml   Filed Weights   08/07/17 2340 08/09/17 0414  Weight: 174 lb 2.6 oz (79 kg) 176 lb 5.9 oz (80 kg)    Telemetry    NsR rates 60 s  - Personally Reviewed  ECG    SB no AV block  - Personally Reviewed  Physical Exam  Chronically ill black male  GEN: No acute distress.   Neck: No JVD post thyroid scar  Cardiac: RRR, no murmurs, rubs, or gallops.  Respiratory: Clear to auscultation bilaterally. GI: Soft, nontender, non-distended  MS: No edema; No deformity. Neuro:  Nonfocal  Psych: Normal affect   Labs    Chemistry Recent Labs  Lab 08/07/17 1631 08/08/17 0450 08/09/17 0550  NA 140 139 138  K 4.3 3.3* 3.8  CL 107 106 104  CO2  24 25 26   GLUCOSE 96 74 88  BUN 9 8 11   CREATININE 1.14 1.12 1.25*  CALCIUM 8.9 8.6* 8.7*  GFRNONAA >60 >60 57*  GFRAA >60 >60 >60  ANIONGAP 9 8 8      Hematology Recent Labs  Lab 08/07/17 1631 08/08/17 0450  WBC 6.1 6.0  RBC 4.92 4.63  HGB 15.6 14.2  HCT 45.4 42.9  MCV 92.3 92.7  MCH 31.7 30.7  MCHC 34.4 33.1  RDW 14.5 15.0  PLT PLATELET CLUMPS NOTED ON SMEAR, COUNT APPEARS ADEQUATE 167    Cardiac Enzymes Recent Labs  Lab 08/08/17 0042 08/08/17 0450 08/08/17 1111  TROPONINI <0.03 <0.03 <0.03    Recent Labs  Lab 08/07/17 1644  TROPIPOC 0.01     BNPNo results for input(s): BNP, PROBNP in the last 168 hours.   DDimer No results for input(s): DDIMER in the last 168 hours.   Radiology    Dg Chest 2 View  Result Date: 08/07/2017 CLINICAL DATA:  Pain following fall EXAM: CHEST  2 VIEW COMPARISON:  Jan 13, 2017 FINDINGS: There is no edema or consolidation. The heart is upper normal in  size with pulmonary vascularity within normal limits. No adenopathy. There is calcification in the left anterior descending coronary artery. There is aortic atherosclerosis. There is degenerative change in thoracic spine. There old healed rib fractures on the right. IMPRESSION: Foci of left anterior descending coronary artery calcification. There is aortic atherosclerosis. There is no edema or consolidation. Aortic Atherosclerosis (ICD10-I70.0). Electronically Signed   By: Bretta Bang III M.D.   On: 08/07/2017 17:11   Ct Head Wo Contrast  Result Date: 08/07/2017 CLINICAL DATA:  Larey Seat backwards in room, bradycardia, hospice patient, unexplained altered level of consciousness EXAM: CT HEAD WITHOUT CONTRAST TECHNIQUE: Contiguous axial images were obtained from the base of the skull through the vertex without intravenous contrast. Sagittal and coronal MPR images reconstructed from axial data set. COMPARISON:  05/08/2017 FINDINGS: Brain: Generalized atrophy. Normal ventricular morphology. No  midline shift or mass effect. Small vessel chronic ischemic changes of deep cerebral white matter. Old lacunar infarct RIGHT thalamus. Small old RIGHT frontal infarct. Resolution of the small new LEFT insular hemorrhage seen on the previous exam. No intracranial hemorrhage, mass lesion, evidence of acute infarction, or extra-axial fluid collection. Vascular: Extensive atherosclerotic calcification of internal carotid and vertebral arteries at skullbase Skull: No acute osseous abnormalities. Stable well-circumscribed lucency in the posterior RIGHT parietal bone near lambdoid suture. Sinuses/Orbits: Clear Other: N/A IMPRESSION: Atrophy with small vessel chronic ischemic changes of deep cerebral white matter. 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 No acute intracranial abnormalities. Electronically Signed   By: Ulyses Southward M.D.   On: 08/07/2017 16:12   Dg Hip Unilat With Pelvis 2-3 Views Left  Result Date: 08/07/2017 CLINICAL DATA:  Larey Seat at nursing facility.  Bradycardia. EXAM: DG HIP (WITH OR WITHOUT PELVIS) 2-3V LEFT COMPARISON:  None. FINDINGS: There is no evidence of hip fracture or dislocation. 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 There is no evidence of arthropathy or other focal bone abnormality. Moderate aortoiliac calcifications. IMPRESSION: No acute fracture deformity or dislocation. Aortic Atherosclerosis (ICD10-I70.0). Electronically Signed   By: Awilda Metro M.D.   On: 08/07/2017 17:12    Cardiac Studies   EF 45-50%   Patient Profile     71 y.o. male admitted with lethargy and bradycardia HR improved off amiodarone and beta blocker no need for pacer   Assessment & Plan    1) Bradycardia:  Resolved no need for pacer  No amiodarone or coreg on d/c 2) CHF:  Continue lasix start low dose cozaar 25 mg   Will arrange outpatient f/u with Dr Tresa Endo Sign off   For questions or updates, please contact [facility] Please consult [email or web address] for  contact info under Cardiology/STEMI.      Signed, Charlton Haws, MD  08/09/2017, 9:14 AM

## 2017-08-09 NOTE — Plan of Care (Signed)
  Clinical Measurements: Ability to maintain clinical measurements within normal limits will improve 08/09/2017 1337 - Progressing by Loel Lofty, RN

## 2017-08-10 LAB — BASIC METABOLIC PANEL
ANION GAP: 8 (ref 5–15)
BUN: 16 mg/dL (ref 6–20)
CHLORIDE: 105 mmol/L (ref 101–111)
CO2: 26 mmol/L (ref 22–32)
Calcium: 8.8 mg/dL — ABNORMAL LOW (ref 8.9–10.3)
Creatinine, Ser: 1.2 mg/dL (ref 0.61–1.24)
GFR calc non Af Amer: 60 mL/min — ABNORMAL LOW (ref >60)
Glucose, Bld: 108 mg/dL — ABNORMAL HIGH (ref 65–99)
Potassium: 3.8 mmol/L (ref 3.5–5.1)
Sodium: 139 mmol/L (ref 135–145)

## 2017-08-10 LAB — GLUCOSE, CAPILLARY
Glucose-Capillary: 111 mg/dL — ABNORMAL HIGH (ref 65–99)
Glucose-Capillary: 145 mg/dL — ABNORMAL HIGH (ref 65–99)
Glucose-Capillary: 134 mg/dL — ABNORMAL HIGH (ref 65–99)
Glucose-Capillary: 107 mg/dL — ABNORMAL HIGH (ref 65–99)

## 2017-08-10 NOTE — NC FL2 (Signed)
[place] MEDICAID FL2 LEVEL OF CARE SCREENING TOOL     IDENTIFICATION  Patient Name: Jordan Rowe Birthdate: [birth year] Sex: male Admission Date (Current Location): 08/07/2017  [facility] [place]   Facility and Address:  [facility], [address], [place], [place] [address]      Provider Number: [id number]  Attending Physician Name and Address:  Latrelle DodrillMcIntyre, Brittany J, MD  Relative Name and Phone Number:       Current Level of Care: Hospital Recommended Level of Care: Skilled Nursing Facility Prior Approval Number:    Date Approved/Denied:   PASRR Number: Manual review  Discharge Plan: SNF    Current Diagnoses: Patient Active Problem List   Diagnosis Date Noted  . Bradycardia 08/07/2017  . Chronic diastolic CHF (congestive heart failure) (HCC) 05/09/2017  . BPH (benign prostatic hyperplasia) 05/09/2017  . Subarachnoid bleed (HCC) 05/09/2017  . Traumatic closed fracture of C6 vertebra with minimal displacement, initial encounter (HCC) 05/08/2017  . Chronic anticoagulation 05/08/2017  . Hematoma of oral cavity 05/08/2017  . Fall   . Stroke-like symptom 01/13/2017  . NSVT (nonsustained ventricular tachycardia) (HCC) 10/05/2015  . Cardiomyopathy (HCC)   . Smoker 10/01/2015  . Schizophrenia (HCC)   . Diabetes mellitus type 2, controlled (HCC) 09/30/2015  . Atrial fibrillation (HCC) 09/30/2015  . Pain in the chest   . 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 Diabetes mellitus (HCC) 02/02/2013  . Dehydration 02/02/2013  . MENTAL RETARDATION 02/12/2009  . Essential hypertension 02/12/2009  . GERD 02/12/2009  . CONSTIPATION 02/12/2009    Orientation RESPIRATION BLADDER Height & Weight     Self  Normal Continent, External catheter Weight: 168 lb 6.4 oz (76.4 kg) Height:  5\' 9"  (175.3 cm)  BEHAVIORAL SYMPTOMS/MOOD NEUROLOGICAL BOWEL NUTRITION STATUS  (Calm, cooperative, flat affect.) (None) Continent Diet(Heart healthy/carb  modified.)  AMBULATORY STATUS COMMUNICATION OF NEEDS Skin   Limited Assist Verbally Bruising                       Personal Care Assistance Level of Assistance  Bathing, Feeding, Dressing Bathing Assistance: Limited assistance Feeding assistance: Limited assistance Dressing Assistance: Limited assistance     Functional Limitations Info  Sight, Hearing, Speech Sight Info: Adequate Hearing Info: Adequate Speech Info: Adequate    SPECIAL CARE FACTORS FREQUENCY  PT (By licensed PT), OT (By licensed OT)     PT Frequency: 5 x week OT Frequency: 5 x week            Contractures Contractures Info: Not present    Additional Factors Info  Code Status, Allergies, Psychotropic Code Status Info: Full Allergies Info: NKDA Psychotropic Info: Schizophrenia: Risperdal 0.25 mg PO daily, Risperdal 2 mg PO QHS.         Current Medications (08/10/2017):  This is the current hospital active medication list Current Facility-Administered Medications  Medication Dose Route Frequency Provider Last Rate Last Dose  . 0.9 %  sodium chloride infusion  250 mL Intravenous PRN Freddrick MarchAmin, Yashika, MD      . acetaminophen (TYLENOL) tablet 650 mg  650 mg Oral Q6H PRN Freddrick MarchAmin, Yashika, MD       Or  . acetaminophen (TYLENOL) suppository 650 mg  650 mg Rectal Q6H PRN Freddrick MarchAmin, Yashika, MD      . docusate sodium (COLACE) capsule 100 mg  100 mg Oral BID Freddrick MarchAmin, Yashika, MD   100 mg at 08/10/17 1019  . enoxaparin (LOVENOX) injection 40 mg  40 mg Subcutaneous Q24H Freddrick March, MD   40 mg at 08/10/17 1018  . finasteride (PROSCAR) tablet 5 mg  5 mg Oral QPM Freddrick March, MD   5 mg at 08/09/17 1707  . furosemide (LASIX) tablet 20 mg  20 mg Oral Daily Freddrick March, MD   20 mg at 08/10/17 1019  . hydrALAZINE (APRESOLINE) tablet 10 mg  10 mg Oral TID PRN Freddrick March, MD      . insulin aspart (novoLOG) injection 0-9 Units  0-9 Units Subcutaneous TID WC Freddrick March, MD   1 Units at 08/09/17 1216  . latanoprost  (XALATAN) 0.005 % ophthalmic solution 1 drop  1 drop Both Eyes QHS Freddrick March, MD   1 drop at 08/09/17 2038  . losartan (COZAAR) tablet 25 mg  25 mg Oral Daily Riccio, Angela C, DO   25 mg at 08/10/17 1019  . risperiDONE (RISPERDAL) tablet 0.25 mg  0.25 mg Oral Daily Freddrick March, MD   0.25 mg at 08/10/17 1018  . risperiDONE (RISPERDAL) tablet 2 mg  2 mg Oral QHS Freddrick March, MD   2 mg at 08/09/17 2037  . sodium chloride flush (NS) 0.9 % injection 3 mL  3 mL Intravenous Q12H Freddrick March, MD   3 mL at 08/10/17 1019  . sodium chloride flush (NS) 0.9 % injection 3 mL  3 mL Intravenous PRN Freddrick March, MD      . tamsulosin (FLOMAX) capsule 0.4 mg  0.4 mg Oral Daily Freddrick March, MD   0.4 mg at 08/10/17 1019     Discharge Medications: Please see discharge summary for a list of discharge medications.  Relevant Imaging Results:  Relevant Lab Results:   Additional Information SS#: [id number]. From [facility].  Margarito Liner, LCSW

## 2017-08-10 NOTE — Plan of Care (Signed)
  Clinical Measurements: Ability to maintain clinical measurements within normal limits will improve 08/10/2017 0748 - Progressing by Loel Lofty, RN

## 2017-08-10 NOTE — Clinical Social Work Note (Signed)
CSW received call from patient's brother. Preference facilities are [facility] and [facility]. [facility] is full. [facility] will verify insurance before extending bed offer. PASARR still under manual review.  Charlynn Court, CSW [phone]

## 2017-08-10 NOTE — Clinical Social Work Note (Signed)
Clinical Social Work Assessment  Patient Details  Name: Jordan Rowe MRN: [id number] Date of Birth: 10/16/1945  Date of referral:  08/10/17               Reason for consult:  Facility Placement, Discharge Planning                Permission sought to share information with:  Facility Medical sales representative, Family Supports Permission granted to share information::  Yes, Verbal Permission Granted  Name::     Jordan Rowe  Agency::  [facility]F, SNF's  Relationship::  Brother/HCPOA  Contact Information:  [phone]  Housing/Transportation Living arrangements for the past 2 months:  Assisted Living Facility Source of Information:  Patient, Medical Team, Other (Comment Required)(Brother) Patient Interpreter Needed:  None Criminal Activity/Legal Involvement Pertinent to Current Situation/Hospitalization:  No - Comment as needed Significant Relationships:  Siblings, Other Family Members Lives with:  Facility Resident Do you feel safe going back to the place where you live?  Yes Need for family participation in patient care:  Yes (Comment)  Care giving concerns:  Patient is [facility]. PT recommending SNF once medically stable for discharge.   Social Worker assessment / plan:  According to RN report, patient oriented to self only. CSW called patient's brother who, according to prior documentation, is the patient's HCPOA. CSW introduced role and explained that PT recommendations would be discussed. Patient's brother confirmed that patient is from [facility]. He is agreeable to SNF placement in [place]. CSW emailed list of facilities. Patient requested to speak with CSW. He was oriented enough to discuss SNF placement. Patient is agreeable and asking when he can go. CSW answered all questions. PASARR is under manual review due to MR and Schizophrenia diagnoses. Patient cannot go to SNF until PASARR obtained. No further concerns. CSW encouraged patient and his  brother to contact CSW as needed. CSW will continue to follow patient and his brother for support and facilitate discharge to SNF once medically stable.  Employment status:  Retired Health and safety inspector:  Armed forces operational officer, Medicaid [place] PT Recommendations:  Skilled Nursing Facility Information / Referral to community resources:  Skilled Nursing Facility  Patient/Family's Response to care:  Patient and his brother agreeable to SNF placement. Patient's family supportive and involved in patient's care. Patient and his brother appreciated social work intervention.  Patient/Family's Understanding of and Emotional Response to Diagnosis, Current Treatment, and Prognosis:  Patient and his brother have a good understanding of the reason for admission and his need for rehab prior to returning to ALF. Patient and his brother appear happy with hospital care.  Emotional Assessment Appearance:  Appears stated age Attitude/Demeanor/Rapport:  Other(Pleasant) Affect (typically observed):  Accepting, Appropriate, Calm, Pleasant Orientation:  Oriented to Self, Oriented to Situation Alcohol / Substance use:  Never Used Psych involvement (Current and /or in the community):  No (Comment)  Discharge Needs  Concerns to be addressed:  Care Coordination Readmission within the last 30 days:  No Current discharge risk:  Cognitively Impaired, Dependent with Mobility Barriers to Discharge:  Awaiting State Approval ([place])   Jordan Liner, LCSW 08/10/2017, 10:27 AM

## 2017-08-10 NOTE — Clinical Social Work Placement (Signed)
   CLINICAL SOCIAL WORK PLACEMENT  NOTE  Date:  08/10/2017  Patient Details  Name: Jordan Rowe MRN: [id number] Date of Birth: 1946/04/03  Clinical Social Work is seeking post-discharge placement for this patient at the Skilled  Nursing Facility level of care (*CSW will initial, date and re-position this form in  chart as items are completed):  Yes   Patient/family provided with [facility] Clinical Social Work Department's list of facilities offering this level of care within the geographic area requested by the patient (or if unable, by the patient's family).  Yes   Patient/family informed of their freedom to choose among providers that offer the needed level of care, that participate in Medicare, Medicaid or managed care program needed by the patient, have an available bed and are willing to accept the patient.  Yes   Patient/family informed of [facility]'s ownership interest in [facility] and [facility], as well as of the fact that they are under no obligation to receive care at these facilities.  PASRR submitted to EDS on 08/10/17     PASRR number received on       Existing PASRR number confirmed on       FL2 transmitted to all facilities in geographic area requested by pt/family on 08/10/17     FL2 transmitted to all facilities within larger geographic area on       Patient informed that his/her managed care company has contracts with or will negotiate with certain facilities, including the following:            Patient/family informed of bed offers received.  Patient chooses bed at       Physician recommends and patient chooses bed at      Patient to be transferred to   on  .  Patient to be transferred to facility by       Patient family notified on   of transfer.  Name of family member notified:        PHYSICIAN Please sign FL2     Additional Comment:    _______________________________________________ Margarito Liner, LCSW 08/10/2017, 10:32  AM

## 2017-08-10 NOTE — Progress Notes (Signed)
Family Medicine Teaching Service Daily Progress Note Intern Pager: [phone]  Patient name: Jordan Rowe Medical record number: [id number] Date of birth: October 14, 1945 Age: 71 y.o. Gender: male  Primary Care Provider: No primary care provider on file. Consultants: None Code Status: Previously DNR, patient states on admission Full Code- unsure of medical capacity   Pt Overview and Major Events to Date:  Jordan Rowe is a 71 yo male presenting with weakness and bradycardia.  Assessment and Plan:  Bradycardia: Chronic, stable.  Patient is now at his baseline of 50s-60s.  Asymptomatic. -Cardiology has signed off -Continue Lasix, holding amiodarone and carvedilol  Generalized weakness:  Reports generalized weakness but with normal appetite and diet.  Uncertain of patient's baseline due to poor historian and psychiatric history.  -Orthostatic vitals were within normal limits -PT/OT have been consulted and recommend SNF  Hypertension:  Improving.  Last BP 129/74.  Hypertensive overnight -Continue Lasix, holding amiodarone and carvedilol due to bradycardia -PRN hydralazine with parameters for SBP >170  Insulin-dependent type 2 DM, well controlled: Hemoglobin A1c 6.7.    Holding home Lantus. -Sensitive sliding scale insulin  -Heart modified/heart healthy diet -Monitor CBGs  CHF with EF 50-55%:  Stable.  Echo: The estimated EF was in the range of 45-50%. Mild diffuse hypokinesis. Features are consistent with a pseudonormal left ventricular filling pattern, with concomitant abnormal relaxation and increased filling pressure (grade 2 diastolic dysfunction). Patient with no lower extremity edema or shortness of breath.  Lung exam without evidence of crackles or fluid overload -last echo 12/2016 showing EF of 50-55% and grade 2 diastolic dysfunction  Hypokalemia: Resolved. 3.8 on 12/11. - monitor on BMP daily  Schizophrenia: Stable.  At home on Risperidone.  -Continue home  medication  FEN/GI: Carb mod/heart healthy  Prophylaxis: Lovenox  Disposition: SNF  Subjective:  Patient resting comfortably in bed.  Patient would like to go home.  Objective: Temp:  [97.5 F (36.4 C)-98.7 F (37.1 C)] 98.7 F (37.1 C) (12/12 0545) Pulse Rate:  [58-69] 63 (12/12 0545) Resp:  [18] 18 (12/12 0545) BP: (129-165)/(73-95) 129/74 (12/12 0545) SpO2:  [99 %] 99 % (12/12 0545) Weight:  [168 lb 6.4 oz (76.4 kg)] 168 lb 6.4 oz (76.4 kg) (12/12 0545) Physical Exam: General: NAD, resting comfortably in bed Cardiovascular: Bradycardic with regular rhythm, no MRG Respiratory: CTA BL, normal work of breathing Abdomen: Nontender Extremities: Moves all extremities equally, no lower extremity edema  Laboratory: Recent Labs  Lab 08/07/17 1631 08/08/17 0450  WBC 6.1 6.0  HGB 15.6 14.2  HCT 45.4 42.9  PLT PLATELET CLUMPS NOTED ON SMEAR, COUNT APPEARS ADEQUATE 167   Recent Labs  Lab 08/08/17 0450 08/09/17 0550 08/10/17 0607  NA 139 138 139  K 3.3* 3.8 3.8  CL 106 104 105  CO2 25 26 26   BUN 8 11 16   CREATININE 1.12 1.25* 1.20  CALCIUM 8.6* 8.7* 8.8*  GLUCOSE 74 88 108*   A1c 6.7 TSH 0.472  Imaging/Diagnostic Tests: No results found.  Jordan Rowe, [place], DO 08/10/2017, 12:46 PM PGY-1, [facility] FPTS Intern pager: [phone], text pages welcome

## 2017-08-10 NOTE — Progress Notes (Signed)
Physical Therapy Treatment Patient Details Name: Jordan Rowe MRN: [id number] DOB: Jan 02, 1946 Today's Date: 08/10/2017    History of Present Illness Jordan Rowe is a 71 y.o. male with a hx of schizophrenia, cognitive impairment, HTN, DM-2 insulin dependent, CHF with hx of EF 20-25% now improved to 50-55% in 2018 and PAF who is being seen today for the evaluation of bradycardia with generalized weakness and unwitnessed fall .   PT Comments    Pt slowly progressing with mobility. Noted to be incontinent of bowel, able to ambulate into bathroom with RW and minA, dependent for pericare. Worked on repeated sit-to-stands as pt demonstrated BLE weakness (L>R) and decreased stability. Continues to demonstrate decreased problem solving and safety awareness throughout treatment. Feel SNF-level therapies at d/c remains appropriate. Will continue to follow acutely.   Follow Up Recommendations  SNF;Supervision for mobility/OOB     Equipment Recommendations  Rolling walker with 5" wheels    Recommendations for Other Services       Precautions / Restrictions Precautions Precautions: Fall Restrictions Weight Bearing Restrictions: No    Mobility  Bed Mobility Overal bed mobility: Needs Assistance Bed Mobility: Supine to Sit;Sit to Supine     Supine to sit: Mod assist;HOB elevated     General bed mobility comments: Cues for use of hand rails to promote mod indep, eventuall requiring modA for UE support to assist trunk elevation. Able to scoot hips to EOB with min guard  Transfers Overall transfer level: Needs assistance Equipment used: Rolling walker (2 wheeled) Transfers: Sit to/from Stand Sit to Stand: Min assist         General transfer comment: Repeated sit-to-stands x5 from bed requiring min guard to minA with increasing fatigue; intial cues for hand placement on bed instead of pulling RW, but pt able to recall these better each trial. Heavy reliance on BUEs to push into  standing. Able to stand from toilet with minA and use of hand rail.   Ambulation/Gait Ambulation/Gait assistance: Min assist Ambulation Distance (Feet): 30 Feet Assistive device: Rolling walker (2 wheeled) Gait Pattern/deviations: Step-to pattern;Decreased step length - left;Decreased weight shift to left;Trunk flexed Gait velocity: Decreased Gait velocity interpretation: <1.8 ft/sec, indicative of risk for recurrent falls General Gait Details: MinA for balance and RW management as pt intermittently bumping into items on floor/wall; repeated cues to maintain closer proximity to RW. Shuffling steps with intermittent dragging of LLE behind; somewhat able to correct with cues   Stairs            Wheelchair Mobility    Modified Rankin (Stroke Patients Only)       Balance Overall balance assessment: Needs assistance Sitting-balance support: Feet supported;No upper extremity supported Sitting balance-Leahy Scale: Fair     Standing balance support: Bilateral upper extremity supported;During functional activity Standing balance-Leahy Scale: Poor Standing balance comment: Unable to stand without UE support; dependent for pericare standing at toilet                            Cognition Arousal/Alertness: Awake/alert Behavior During Therapy: Flat affect Overall Cognitive Status: History of cognitive impairments - at baseline                                        Exercises      General Comments General comments (skin integrity, edema, etc.): Slight incontinence of bowel  found in bed, dependent for pericare (RN notified)      Pertinent Vitals/Pain Pain Assessment: No/denies pain    Home Living                      Prior Function            PT Goals (current goals can now be found in the care plan section) Acute Rehab PT Goals Patient Stated Goal: "Can I go to rehab today?" "Can I go to rehab tomorrow?" PT Goal Formulation: With  patient Time For Goal Achievement: 08/22/17 Potential to Achieve Goals: Good Progress towards PT goals: Progressing toward goals    Frequency    Min 2X/week      PT Plan Frequency needs to be updated;Current plan remains appropriate    Co-evaluation              AM-PAC PT "6 Clicks" Daily Activity  Outcome Measure  Difficulty turning over in bed (including adjusting bedclothes, sheets and blankets)?: A Little Difficulty moving from lying on back to sitting on the side of the bed? : Unable Difficulty sitting down on and standing up from a chair with arms (e.g., wheelchair, bedside commode, etc,.)?: Unable Help needed moving to and from a bed to chair (including a wheelchair)?: A Little Help needed walking in hospital room?: A Little Help needed climbing 3-5 steps with a railing? : Total 6 Click Score: 12    End of Session Equipment Utilized During Treatment: Gait belt Activity Tolerance: Patient tolerated treatment well Patient left: in bed;with call bell/phone within reach;with bed alarm set Nurse Communication: Mobility status PT Visit Diagnosis: Unsteadiness on feet (R26.81);History of falling (Z91.81)     Time: [id number] PT Time Calculation (min) (ACUTE ONLY): 39 min  Charges:  $Gait Training: 8-22 mins $Therapeutic Exercise: 8-22 mins $Therapeutic Activity: 8-22 mins                    G Codes:      Ina HomesJaclyn Kiyon Fidalgo, PT, DPT Acute Rehab Services  Pager: [phone]  Malachy ChamberJaclyn L Harlee Pursifull 08/10/2017, 1:55 PM

## 2017-08-11 LAB — GLUCOSE, CAPILLARY
GLUCOSE-CAPILLARY: 134 mg/dL — AB (ref 65–99)
Glucose-Capillary: 108 mg/dL — ABNORMAL HIGH (ref 65–99)
Glucose-Capillary: 158 mg/dL — ABNORMAL HIGH (ref 65–99)

## 2017-08-11 LAB — BASIC METABOLIC PANEL
Anion gap: 9 (ref 5–15)
BUN: 14 mg/dL (ref 6–20)
CHLORIDE: 104 mmol/L (ref 101–111)
CO2: 25 mmol/L (ref 22–32)
CREATININE: 1.08 mg/dL (ref 0.61–1.24)
Calcium: 9.1 mg/dL (ref 8.9–10.3)
GFR calc Af Amer: >60 mL/min (ref >60)
GFR calc non Af Amer: >60 mL/min (ref >60)
GLUCOSE: 113 mg/dL — AB (ref 65–99)
POTASSIUM: 3.7 mmol/L (ref 3.5–5.1)
Sodium: 138 mmol/L (ref 135–145)

## 2017-08-11 MED ORDER — LOSARTAN POTASSIUM 25 MG PO TABS: 25.0000 mg | ORAL_TABLET | Freq: Every day | ORAL | 0 refills | Status: AC

## 2017-08-11 NOTE — Plan of Care (Signed)
  Education: Knowledge of General Education information will improve 08/11/2017 0721 - Progressing by Loel Lofty, RN

## 2017-08-11 NOTE — Progress Notes (Signed)
Family Medicine Teaching Service Daily Progress Note Intern Pager: [phone]  Patient name: Jordan Rowe Medical record number: [id number] Date of birth: 03/16/1946 Age: 71 y.o. Gender: male  Primary Care Provider: No primary care provider on file. Consultants: None Code Status: Previously DNR, patient states on admission Full Code- unsure of medical capacity   Pt Overview and Major Events to Date:  Jordan Rowe is a 71 yo male presenting with weakness and bradycardia.  Assessment and Plan:  Bradycardia:  Improving.  Chronic, stable.  Patient is now at his baseline of 50s-60s.  Asymptomatic. Today he is bradycardic in the range of 46-52. -Cardiology is following, appreciate recommendations -Continue Lasix, holding amiodarone and carvedilol - Echo: The estimated ejection fraction was in the range of 45% to 50%. Mild diffuse hypokinesis. Features are consistent with a pseudonormal left ventricular filling pattern, with concomitant abnormal relaxation and increased filling pressure (grade 2 diastolic dysfunction).  Generalized weakness:  Reports generalized weakness but with normal appetite and diet.  Uncertain of patient's baseline due to poor historian and psychiatric history.  -Orthostatic vitals were within normal limits -PT/OT have been consulted I recommend patient to return to his ALF 1 -Nutrition consulted and does not think he needs further evaluation  HTN:  Hypertensive with last BP of 145/78 this am.   -Continue Lasix, holding amiodarone and carvedilol due to bradycardia -PRN hydralazine with parameters for SBP >170  Insulin-dependent type 2 DM, well controlled: Hemoglobin A1c 6.7. Holding home Lantus. -Sensitive sliding scale insulin  -Heart modified/heart healthy diet -Monitor CBGs  CHF with EF 50-55%: Echo: The estimated EF was in the range of 45-50%. Mild diffuse hypokinesis. Features are consistent with a pseudonormal left ventricular filling pattern, with  concomitant abnormal relaxation and increased filling pressure (grade 2 diastolic dysfunction). Patient with no lower extremity edema or shortness of breath.   -last echo 12/2016 showing EF of 50-55% and grade 2 diastolic dysfunction  Hypokalemia: Resolved. 3.7 on 12/13. - monitor on BMP daily  Schizophrenia: Stable. At home on Risperidone.  -Continue home medication  FEN/GI: Carb mod/heart healthy  Prophylaxis: Lovenox  Disposition: Back to ALF when cleared by cardiology  Subjective:  Patient resting comfortably in bed and eating breakfast. He states he is in no discomfort and that he was ok going back to his ALF today.  Objective: Temp:  [98.2 F (36.8 C)-98.4 F (36.9 C)] 98.4 F (36.9 C) (12/13 0534) Pulse Rate:  [46-52] 46 (12/13 0534) Resp:  [18] 18 (12/13 0534) BP: (138-165)/(83-91) 138/83 (12/13 0534) SpO2:  [96 %-100 %] 96 % (12/13 0534) Weight:  [172 lb 14.4 oz (78.4 kg)] 172 lb 14.4 oz (78.4 kg) (12/13 0534) Physical Exam: General: NAD, resting in bed comfortably Cardiovascular: NSR, Bradycardic, no murmur Respiratory: CTAB, normal work of breathing Abdomen: Nontender, soft, +bs Extremities: Moves all extremities equally, no lower extremity edema  Laboratory: Recent Labs  Lab 08/07/17 1631 08/08/17 0450  WBC 6.1 6.0  HGB 15.6 14.2  HCT 45.4 42.9  PLT PLATELET CLUMPS NOTED ON SMEAR, COUNT APPEARS ADEQUATE 167   Recent Labs  Lab 08/09/17 0550 08/10/17 0607 08/11/17 0641  NA 138 139 138  K 3.8 3.8 3.7  CL 104 105 104  CO2 26 26 25   BUN 11 16 14   CREATININE 1.25* 1.20 1.08  CALCIUM 8.7* 8.8* 9.1  GLUCOSE 88 108* 113*   A1c 6.7 TSH 0.472  Imaging/Diagnostic Tests: No results found.  Arlyce Harman, DO 08/11/2017, 9:52 AM PGY-1, [facility] FPTS  Intern pager: [phone], text pages welcome

## 2017-08-11 NOTE — Clinical Social Work Note (Signed)
CSW facilitated patient discharge including contacting patient family and facility to confirm patient discharge plans. Clinical information faxed to facility and family agreeable with plan. CSW arranged ambulance transport via [facility] to [facility] at 4:00 pm. RN to call report prior to discharge ([phone]).  CSW will sign off for now as social work intervention is no longer needed. Please consult us again if new needs arise.  Cuba Natarajan, CSW [phone]   

## 2017-08-11 NOTE — Clinical Social Work Placement (Signed)
   CLINICAL SOCIAL WORK PLACEMENT  NOTE  Date:  08/11/2017  Patient Details  Name: Jordan Rowe MRN: [id number] Date of Birth: 1946/02/17  Clinical Social Work is seeking post-discharge placement for this patient at the Skilled  Nursing Facility level of care (*CSW will initial, date and re-position this form in  chart as items are completed):  Yes   Patient/family provided with [facility] Clinical Social Work Department's list of facilities offering this level of care within the geographic area requested by the patient (or if unable, by the patient's family).  Yes   Patient/family informed of their freedom to choose among providers that offer the needed level of care, that participate in Medicare, Medicaid or managed care program needed by the patient, have an available bed and are willing to accept the patient.  Yes   Patient/family informed of [facility]'s ownership interest in [facility] and [facility], as well as of the fact that they are under no obligation to receive care at these facilities.  PASRR submitted to EDS on 08/10/17     PASRR number received on 08/11/17     Existing PASRR number confirmed on       FL2 transmitted to all facilities in geographic area requested by pt/family on 08/10/17     FL2 transmitted to all facilities within larger geographic area on       Patient informed that his/her managed care company has contracts with or will negotiate with certain facilities, including the following:        Yes   Patient/family informed of bed offers received.  Patient chooses bed at [facility]     Physician recommends and patient chooses bed at      Patient to be transferred to [facility] on 08/11/17.  Patient to be transferred to facility by [facility]     Patient family notified on 08/11/17 of transfer.  Name of family member notified:  [name]     PHYSICIAN Please prepare prescriptions     Additional Comment:     _______________________________________________ Margarito Liner, LCSW 08/11/2017, 1:28 PM

## 2017-08-11 NOTE — Clinical Social Work Note (Addendum)
Patient's brother has accepted bed offer from [facility]. Facility aware. 30-day note for PASARR review is on chart for MD to sign. MD is aware.  Charlynn Court, CSW [phone]  12:12 pm 30 day note not signed yet.  Charlynn Court, CSW [phone]  12:29 pm MD signed 30 day note. Requested documentation uploaded into [facility] for PASARR review.  Charlynn Court, CSW [phone]  12:44 pm PASARR obtained: [id number] E. MD and SNF admissions coordinator notified.  Charlynn Court, CSW [phone]

## 2017-08-11 NOTE — Progress Notes (Signed)
1444 - called [facility] at [phone] to attempt to give report; however, after being on hold, no one ever answered and therefore, unable to give report to receiving RN.

## 2017-09-05 ENCOUNTER — Ambulatory Visit: Admitting: Cardiovascular Disease

## 2017-09-07 ENCOUNTER — Encounter: Payer: Self-pay | Admitting: Physician Assistant

## 2017-09-07 ENCOUNTER — Ambulatory Visit (INDEPENDENT_AMBULATORY_CARE_PROVIDER_SITE_OTHER): Payer: Medicare Other | Admitting: Physician Assistant

## 2017-09-07 VITALS — BP 138/81 | HR 58 | Ht 69.0 in | Wt 188.0 lb

## 2017-09-07 DIAGNOSIS — I10 Essential (primary) hypertension: Secondary | ICD-10-CM | POA: Diagnosis not present

## 2017-09-07 DIAGNOSIS — Z8673 Personal history of transient ischemic attack (TIA), and cerebral infarction without residual deficits: Secondary | ICD-10-CM | POA: Diagnosis not present

## 2017-09-07 DIAGNOSIS — I480 Paroxysmal atrial fibrillation: Secondary | ICD-10-CM

## 2017-09-07 DIAGNOSIS — E119 Type 2 diabetes mellitus without complications: Secondary | ICD-10-CM

## 2017-09-07 DIAGNOSIS — Z79899 Other long term (current) drug therapy: Secondary | ICD-10-CM

## 2017-09-07 DIAGNOSIS — R001 Bradycardia, unspecified: Secondary | ICD-10-CM

## 2017-09-07 DIAGNOSIS — F79 Unspecified intellectual disabilities: Secondary | ICD-10-CM

## 2017-09-07 NOTE — Patient Instructions (Signed)
Medication Instructions:  Continue current medications  If you need a refill on your cardiac medications before your next appointment, please call your pharmacy.  Labwork: BMP Today HERE IN OUR OFFICE AT [facility]  Take the provided lab slips for you to take with you to the lab for you blood draw.   You will NOT need to fast   You may go to any [facility] lab that is convenient for you however, we do have a lab in our office that is able to assist you. You do NOT need an appointment for our lab. Once in our office lobby there is a podium to the right of the check-in desk where you are to sign-in and ring a doorbell to alert [place] you are here. Lab is open Monday-Friday from 8:00am to 4:00pm; and is closed for lunch from 12:45p-1:45pm   Testing/Procedures: None Ordered  Follow-Up: Your physician wants you to follow-up in: 3 Months in [facility]. .   Thank you for choosing [facility] at [facility]!!

## 2017-09-07 NOTE — Progress Notes (Addendum)
Cardiology Office Note    Date:  09/09/2017   ID:  Jordan Rowe, DOB 08/12/1946, MRN [id number]  PCP:  Patient, No Pcp Per  Cardiologist:  [facility]  Chief Complaint  Patient presents with  . Follow-up    hospital stay    History of Present Illness:  Jordan Rowe is a 72 y.o. 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  He was initially evaluated in the ED in January 2017 with chest pain, he was noted to be in atrial fibrillation with RVR of unknown duration at that time.  He was initially started on IV Cardizem, this was later switched to carvedilol.  He was also placed on amiodarone and Eliquis due to elevated CHA2DS2-Vasc score.  Echocardiogram at the time showed EF 20-25%.  He was already on ACE inhibitor prior to admission.  He was also started on spironolactone as well.  Myoview showed no ischemia.  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  He has been seen by our atrial fibrillation clinic since March 2017, last follow-up with atrial fibrillation clinic was in August 2018 at which time he was doing well without significant palpitation.  EKG at the time showed heart rate 50, sinus bradycardia.  Unfortunately, patient suffered a fall in September and CT of the head showed a small subarachnoid hemorrhage with C6 mild fracture.  C6 fracture was managed by soft collar, no surgery planned.  Neurosurgery was consulted for subarachnoid hemorrhage, felt the patient is not a surgery candidate either.  His Eliquis was discontinued.  He has been on hospice.  He returned to the hospital in December with weakness and was evaluated for bradycardia.  His systolic blood pressure was hypertensive in the 170s.  EKG showed sinus bradycardia with prolonged QTC.  After admission, his amiodarone and carvedilol were held.  His heart rate eventually improved.  He was started on Cozaar for blood  pressure management.  A repeat echocardiogram showed ejection fraction has improved to 45-50%, grade 2 DD, mild AI, PA peak pressure 20 mmHg.  Latest CT of the head obtained on 08/07/2017 showed atrophy of was small vessel chronic ischemic changes, old infarcts in the right frontal region on the right thalamus, no acute intracranial etiology.  Patient is from [facility].  He denies any significant chest pain or shortness of breath.  According to his transporter from [facility], patient has been essentially wheelchair-bound since recent discharge.  He denies any dizziness.  His heart rate remained in the high 40s-50s range on no rate control therapy.  Given lack of symptom, there is no need for any pacemaker.  We will continue on the current therapy was losartan.  His blood pressure is well controlled.  He will need a basic metabolic panel today and follow-up with atrial fibrillation clinic in 3 months.   Past Medical History:  Diagnosis Date  . Atrial fibrillation with RVR (HCC)    [name] 09/30/2015  . BPH (benign prostatic hypertrophy)    [name] 09/30/2015  . Constipation   . Edema   . Fracture of C6 vertebra, closed (HCC) 04/2017  . GERD (gastroesophageal reflux disease)   . Glaucoma   . Hypertension   . ICB (intracranial bleed) (HCC) 04/2017   with fall  . Mental retardation    [name] 09/30/2015  . Schizophrenia (HCC)   . Stroke (HCC)   . Type II diabetes mellitus (HCC)    [name] 09/30/2015  .  Vitamin D deficiency     Past Surgical History:  Procedure Laterality Date  . CATARACT EXTRACTION Left 12/2002   [name] 01/12/2011  . CATARACT EXTRACTION W/ INTRAOCULAR LENS IMPLANT Right 01/2011   [name] 02/18/2011  . MULTIPLE TOOTH EXTRACTIONS      Current Medications: Outpatient Medications Prior to Visit  Medication Sig Dispense Refill  . acetaminophen (TYLENOL) 325 MG tablet Take 325-650 mg by mouth every 6 (six) hours as needed for mild pain or moderate pain.     . bisacodyl  (DULCOLAX) 10 MG suppository Place 10 mg rectally as needed for moderate constipation.    . cholecalciferol (VITAMIN D) 1000 UNITS tablet Take 1,000 Units by mouth every morning.     . finasteride (PROSCAR) 5 MG tablet Take 5 mg by mouth every evening.     . furosemide (LASIX) 20 MG tablet Take 20 mg by mouth daily.    . insulin glargine (LANTUS) 100 UNIT/ML injection Inject 0.05 mLs (5 Units total) into the skin at bedtime. Hold is FSBS is less than 75 (Patient taking differently: Inject 8 Units into the skin at bedtime. Hold is FSBS is less than 75) 10 mL 11  . latanoprost (XALATAN) 0.005 % ophthalmic solution Place 1 drop into both eyes at bedtime.    Marland Kitchen losartan (COZAAR) 25 MG tablet Take 1 tablet (25 mg total) by mouth daily. 30 tablet 0  . olopatadine (PATANOL) 0.1 % ophthalmic solution Place 1 drop into both eyes every morning.    Marland Kitchen omeprazole (PRILOSEC) 20 MG capsule Take 40 mg by mouth every evening. 30 min prior to evening meal.    . Polyvinyl Alcohol-Povidone (REFRESH OP) Place 1 drop into both eyes 2 (two) times daily.    . risperiDONE (RISPERDAL) 0.25 MG tablet Take 0.25 mg by mouth every morning.     . risperiDONE (RISPERDAL) 2 MG tablet Take 2 mg by mouth at bedtime.    . senna (SENOKOT) 8.6 MG TABS Take 1 tablet by mouth daily.     Marland Kitchen senna-docusate (SENOKOT-S) 8.6-50 MG tablet Take 2 tablets by mouth 2 (two) times daily.    . tamsulosin (FLOMAX) 0.4 MG CAPS capsule Take 1 capsule (0.4 mg total) by mouth daily. 30 capsule 0  . timolol (BETIMOL) 0.5 % ophthalmic solution Place 1 drop into both eyes daily.     No facility-administered medications prior to visit.      Allergies:   Patient has no known allergies.   Social History   Socioeconomic History  . Marital status: Divorced    Spouse name: None  . Number of children: None  . Years of education: None  . Highest education level: None  Social Needs  . Financial resource strain: None  . Food insecurity - worry: None  .  Food insecurity - inability: None  . Transportation needs - medical: None  . Transportation needs - non-medical: None  Occupational History  . None  Tobacco Use  . Smoking status: Current Every Day Smoker  . Smokeless tobacco: Never Used  Substance and Sexual Activity  . Alcohol use: No  . Drug use: No  . Sexual activity: None  Other Topics Concern  . None  Social History Narrative  . None     Family History:  The patient's family history includes Hypertension in his mother.   ROS:   Please see the history of present illness.    ROS All other systems reviewed and are negative.   PHYSICAL EXAM:  VS:  BP 138/81   Pulse (!) 58   Ht 5\' 9"  (1.753 m)   Wt 188 lb (85.3 kg)   BMI 27.76 kg/m    GEN: only able to answer question with yes or no HEENT: normal  Neck: no JVD, carotid bruits, or masses Cardiac: RRR; no murmurs, rubs, or gallops,no edema  Respiratory:  clear to auscultation bilaterally, normal work of breathing GI: soft, nontender, nondistended, + BS MS: no deformity or atrophy  Skin: warm and dry, no rash Neuro:  Alert and Oriented x 3, Strength and sensation are intact Psych: euthymic mood, full affect  Wt Readings from Last 3 Encounters:  09/07/17 188 lb (85.3 kg)  08/11/17 172 lb 14.4 oz (78.4 kg)  05/09/17 169 lb 12.1 oz (77 kg)      Studies/Labs Reviewed:   EKG:  EKG is ordered today.  The ekg ordered today demonstrates sinus brady with HR 49, poor R wave progression in anterior leads  Recent Labs: 04/21/2017: ALT 18 08/07/2017: TSH 0.472 08/08/2017: Hemoglobin 14.2; Magnesium 1.9; Platelets 167 09/07/2017: BUN 9; Creatinine, Ser 1.09; Potassium 3.9; Sodium 146   Lipid Panel    Component Value Date/Time   CHOL 133 01/14/2017 0400   TRIG 78 01/14/2017 0400   HDL 49 01/14/2017 0400   CHOLHDL 2.7 01/14/2017 0400   VLDL 16 01/14/2017 0400   LDLCALC 68 01/14/2017 0400    Additional studies/ records that were reviewed today include:    Echo  08/08/2017 LV EF: 45% -   50%  Study Conclusions  - Left ventricle: The cavity size was mildly dilated. Systolic   function was mildly reduced. The estimated ejection fraction was   in the range of 45% to 50%. Mild diffuse hypokinesis. Features   are consistent with a pseudonormal left ventricular filling   pattern, with concomitant abnormal relaxation and increased   filling pressure (grade 2 diastolic dysfunction). - Aortic valve: Transvalvular velocity was within the normal range.   There was no stenosis. There was mild regurgitation. - Mitral valve: Transvalvular velocity was within the normal range.   There was no evidence for stenosis. There was no regurgitation. - Left atrium: The atrium was moderately dilated. - Right ventricle: The cavity size was normal. Wall thickness was   normal. Systolic function was normal. - Right atrium: The atrium was mildly dilated. - Atrial septum: No defect or patent foramen ovale was identified   by color flow Doppler. - Tricuspid valve: There was mild regurgitation. - Pulmonary arteries: Systolic pressure was within the normal   range. PA peak pressure: 20 mm Hg (S).  ASSESSMENT:    1. Bradycardia   2. Medication management   3. PAF (paroxysmal atrial fibrillation) (HCC)   4. Essential hypertension   5. H/O: CVA (cerebrovascular accident)   6. Controlled type 2 diabetes mellitus without complication, without long-term current use of insulin (HCC)   7. Mental retardation      PLAN:  In order of problems listed above:  1. Sinus bradycardia: Not on any rate control medication, heart rate mainly in the 40s and 50s, will continue the current therapy, no indication for pacemaker.  Patient denies any dizziness, blurred vision or feeling of passing out.  He lives in [place] and is primarily wheelchair bound since recent discharge.   2. Paroxysmal atrial fibrillation: Maintaining sinus rhythm at this time, he is not on any systemic  anticoagulation.  He is unable to care for himself, not a good candidate for  anticoagulation therapy especially in light of recent fall and subarachnoid hemorrhage.  CHA2DS2-Vasc score 5. (HTN, DM II, CVA and age).  He is off amiodarone due to bradycardia  3. Hypertension: Blood pressure stable  4. DM 2: on insulin, managed by primary care provider  5. History of CVA: Currently without amiodarone and rate control therapy, he does have possibility of recurrence of atrial fibrillation.  His stroke risk is elevated.  I did not add aspirin at this time due to recent subarachnoid bleed.  Overall he has very limited quality of life.    Medication Adjustments/Labs and Tests Ordered: Current medicines are reviewed at length with the patient today.  Concerns regarding medicines are outlined above.  Medication changes, Labs and Tests ordered today are listed in the Patient Instructions below. Patient Instructions  Medication Instructions:  Continue current medications  If you need a refill on your cardiac medications before your next appointment, please call your pharmacy.  Labwork: BMP Today HERE IN OUR OFFICE AT [facility]  Take the provided lab slips for you to take with you to the lab for you blood draw.   You will NOT need to fast   You may go to any [facility] lab that is convenient for you however, we do have a lab in our office that is able to assist you. You do NOT need an appointment for our lab. Once in our office lobby there is a podium to the right of the check-in desk where you are to sign-in and ring a doorbell to alert [place] you are here. Lab is open Monday-Friday from 8:00am to 4:00pm; and is closed for lunch from 12:45p-1:45pm   Testing/Procedures: None Ordered  Follow-Up: Your physician wants you to follow-up in: 3 Months in Afib Clinic. .   Thank you for choosing [facility] at [employer], [place]  09/09/2017 11:25 AM    [facility]  HeartCare [address] [place], [place], [place]  [address] Phone: [phone]; Fax: [phone]

## 2017-09-08 LAB — BASIC METABOLIC PANEL
BUN/Creatinine Ratio: 8 — ABNORMAL LOW (ref 10–24)
BUN: 9 mg/dL (ref 8–27)
CALCIUM: 8.9 mg/dL (ref 8.6–10.2)
CHLORIDE: 103 mmol/L (ref 96–106)
CO2: 24 mmol/L (ref 20–29)
Creatinine, Ser: 1.09 mg/dL (ref 0.76–1.27)
GFR calc Af Amer: 80 mL/min/{1.73_m2} (ref >59)
GFR calc non Af Amer: 69 mL/min/{1.73_m2} (ref >59)
Glucose: 174 mg/dL — ABNORMAL HIGH (ref 65–99)
POTASSIUM: 3.9 mmol/L (ref 3.5–5.2)
Sodium: 146 mmol/L — ABNORMAL HIGH (ref 134–144)

## 2017-09-09 ENCOUNTER — Encounter: Payer: Self-pay | Admitting: Physician Assistant

## 2017-09-12 NOTE — Addendum Note (Signed)
Addended by: Barrie Dunker on: 09/12/2017 07:59 AM   Modules accepted: Orders

## 2017-10-19 ENCOUNTER — Ambulatory Visit (HOSPITAL_COMMUNITY): Admitting: Nurse Practitioner

## 2017-11-29 ENCOUNTER — Inpatient Hospital Stay (HOSPITAL_COMMUNITY)
Admission: EM | Admit: 2017-11-29 | Discharge: 2017-12-02 | DRG: 194 | Disposition: A | Discharge status: Home / Self Care | Payer: Medicare Other | Attending: Family Medicine | Admitting: Family Medicine

## 2017-11-29 ENCOUNTER — Emergency Department (HOSPITAL_COMMUNITY): Payer: Medicare Other

## 2017-11-29 DIAGNOSIS — Z8673 Personal history of transient ischemic attack (TIA), and cerebral infarction without residual deficits: Secondary | ICD-10-CM

## 2017-11-29 DIAGNOSIS — Z794 Long term (current) use of insulin: Secondary | ICD-10-CM

## 2017-11-29 DIAGNOSIS — Z79899 Other long term (current) drug therapy: Secondary | ICD-10-CM

## 2017-11-29 DIAGNOSIS — J181 Lobar pneumonia, unspecified organism: Secondary | ICD-10-CM | POA: Diagnosis not present

## 2017-11-29 DIAGNOSIS — H409 Unspecified glaucoma: Secondary | ICD-10-CM | POA: Diagnosis present

## 2017-11-29 DIAGNOSIS — I5022 Chronic systolic (congestive) heart failure: Secondary | ICD-10-CM | POA: Diagnosis present

## 2017-11-29 DIAGNOSIS — E119 Type 2 diabetes mellitus without complications: Secondary | ICD-10-CM | POA: Diagnosis present

## 2017-11-29 DIAGNOSIS — I10 Essential (primary) hypertension: Secondary | ICD-10-CM | POA: Diagnosis present

## 2017-11-29 DIAGNOSIS — Y95 Nosocomial condition: Secondary | ICD-10-CM | POA: Diagnosis present

## 2017-11-29 DIAGNOSIS — I110 Hypertensive heart disease with heart failure: Secondary | ICD-10-CM | POA: Diagnosis present

## 2017-11-29 DIAGNOSIS — I480 Paroxysmal atrial fibrillation: Secondary | ICD-10-CM | POA: Diagnosis present

## 2017-11-29 DIAGNOSIS — K219 Gastro-esophageal reflux disease without esophagitis: Secondary | ICD-10-CM | POA: Diagnosis present

## 2017-11-29 DIAGNOSIS — F79 Unspecified intellectual disabilities: Secondary | ICD-10-CM | POA: Diagnosis present

## 2017-11-29 DIAGNOSIS — I340 Nonrheumatic mitral (valve) insufficiency: Secondary | ICD-10-CM | POA: Diagnosis present

## 2017-11-29 DIAGNOSIS — F17200 Nicotine dependence, unspecified, uncomplicated: Secondary | ICD-10-CM | POA: Diagnosis present

## 2017-11-29 DIAGNOSIS — I4891 Unspecified atrial fibrillation: Secondary | ICD-10-CM | POA: Diagnosis present

## 2017-11-29 DIAGNOSIS — N400 Enlarged prostate without lower urinary tract symptoms: Secondary | ICD-10-CM | POA: Diagnosis present

## 2017-11-29 DIAGNOSIS — I429 Cardiomyopathy, unspecified: Secondary | ICD-10-CM | POA: Diagnosis present

## 2017-11-29 DIAGNOSIS — F209 Schizophrenia, unspecified: Secondary | ICD-10-CM | POA: Diagnosis present

## 2017-11-29 DIAGNOSIS — J189 Pneumonia, unspecified organism: Secondary | ICD-10-CM | POA: Diagnosis present

## 2017-11-29 DIAGNOSIS — Z9181 History of falling: Secondary | ICD-10-CM

## 2017-11-29 NOTE — ED Notes (Signed)
Bed: WA21 Expected date:  Expected time:  Means of arrival:  Comments: EMS 72 yo male from [place]-fever 2155 99.8 Tylenol 650 mg CBG 185 BP 150/90

## 2017-11-29 NOTE — ED Triage Notes (Signed)
Pt coming from [facility]. High fever started today according to facility. Pt is lethargic however CAOX4. Pt is hot to touch. CBG 185.

## 2017-11-30 ENCOUNTER — Other Ambulatory Visit: Payer: Self-pay

## 2017-11-30 ENCOUNTER — Encounter (HOSPITAL_COMMUNITY): Payer: Self-pay

## 2017-11-30 DIAGNOSIS — N400 Enlarged prostate without lower urinary tract symptoms: Secondary | ICD-10-CM | POA: Diagnosis present

## 2017-11-30 DIAGNOSIS — J181 Lobar pneumonia, unspecified organism: Principal | ICD-10-CM

## 2017-11-30 DIAGNOSIS — E119 Type 2 diabetes mellitus without complications: Secondary | ICD-10-CM | POA: Diagnosis present

## 2017-11-30 DIAGNOSIS — I5022 Chronic systolic (congestive) heart failure: Secondary | ICD-10-CM | POA: Diagnosis present

## 2017-11-30 DIAGNOSIS — H409 Unspecified glaucoma: Secondary | ICD-10-CM | POA: Diagnosis present

## 2017-11-30 DIAGNOSIS — Z9181 History of falling: Secondary | ICD-10-CM | POA: Diagnosis not present

## 2017-11-30 DIAGNOSIS — I340 Nonrheumatic mitral (valve) insufficiency: Secondary | ICD-10-CM | POA: Diagnosis present

## 2017-11-30 DIAGNOSIS — I429 Cardiomyopathy, unspecified: Secondary | ICD-10-CM | POA: Diagnosis present

## 2017-11-30 DIAGNOSIS — Y95 Nosocomial condition: Secondary | ICD-10-CM | POA: Diagnosis present

## 2017-11-30 DIAGNOSIS — F209 Schizophrenia, unspecified: Secondary | ICD-10-CM | POA: Diagnosis present

## 2017-11-30 DIAGNOSIS — Z8673 Personal history of transient ischemic attack (TIA), and cerebral infarction without residual deficits: Secondary | ICD-10-CM | POA: Diagnosis not present

## 2017-11-30 DIAGNOSIS — J189 Pneumonia, unspecified organism: Secondary | ICD-10-CM | POA: Diagnosis present

## 2017-11-30 DIAGNOSIS — I480 Paroxysmal atrial fibrillation: Secondary | ICD-10-CM

## 2017-11-30 DIAGNOSIS — Z794 Long term (current) use of insulin: Secondary | ICD-10-CM | POA: Diagnosis not present

## 2017-11-30 DIAGNOSIS — F17200 Nicotine dependence, unspecified, uncomplicated: Secondary | ICD-10-CM | POA: Diagnosis present

## 2017-11-30 DIAGNOSIS — I110 Hypertensive heart disease with heart failure: Secondary | ICD-10-CM | POA: Diagnosis present

## 2017-11-30 DIAGNOSIS — K219 Gastro-esophageal reflux disease without esophagitis: Secondary | ICD-10-CM | POA: Diagnosis present

## 2017-11-30 DIAGNOSIS — I10 Essential (primary) hypertension: Secondary | ICD-10-CM

## 2017-11-30 DIAGNOSIS — E118 Type 2 diabetes mellitus with unspecified complications: Secondary | ICD-10-CM | POA: Diagnosis not present

## 2017-11-30 DIAGNOSIS — F79 Unspecified intellectual disabilities: Secondary | ICD-10-CM | POA: Diagnosis present

## 2017-11-30 DIAGNOSIS — Z79899 Other long term (current) drug therapy: Secondary | ICD-10-CM | POA: Diagnosis not present

## 2017-11-30 LAB — I-STAT CG4 LACTIC ACID, ED: LACTIC ACID, VENOUS: 2.95 mmol/L — AB (ref 0.5–1.9)

## 2017-11-30 LAB — CBC WITH DIFFERENTIAL/PLATELET
BASOS ABS: 0 10*3/uL (ref 0.0–0.1)
Basophils Relative: 0 %
Eosinophils Absolute: 0.1 10*3/uL (ref 0.0–0.7)
Eosinophils Relative: 2 %
HEMATOCRIT: 41.8 % (ref 39.0–52.0)
HEMOGLOBIN: 13.6 g/dL (ref 13.0–17.0)
Lymphocytes Relative: 33 %
Lymphs Abs: 1.9 10*3/uL (ref 0.7–4.0)
MCH: 30.7 pg (ref 26.0–34.0)
MCHC: 32.5 g/dL (ref 30.0–36.0)
MCV: 94.4 fL (ref 78.0–100.0)
MONOS PCT: 13 %
Monocytes Absolute: 0.7 10*3/uL (ref 0.1–1.0)
NEUTROS PCT: 52 %
Neutro Abs: 3 10*3/uL (ref 1.7–7.7)
Platelets: 184 10*3/uL (ref 150–400)
RBC: 4.43 MIL/uL (ref 4.22–5.81)
RDW: 13.5 % (ref 11.5–15.5)
WBC: 5.7 10*3/uL (ref 4.0–10.5)

## 2017-11-30 LAB — PROTIME-INR
INR: 1.06
Prothrombin Time: 13.7 seconds (ref 11.4–15.2)

## 2017-11-30 LAB — STREP PNEUMONIAE URINARY ANTIGEN: Strep Pneumo Urinary Antigen: NEGATIVE

## 2017-11-30 LAB — URINALYSIS, ROUTINE W REFLEX MICROSCOPIC
BILIRUBIN URINE: NEGATIVE
Bacteria, UA: NONE SEEN
GLUCOSE, UA: 150 mg/dL — AB
KETONES UR: 5 mg/dL — AB
LEUKOCYTES UA: NEGATIVE
NITRITE: NEGATIVE
PROTEIN: NEGATIVE mg/dL
Specific Gravity, Urine: 1.025 (ref 1.005–1.030)
Squamous Epithelial / LPF: NONE SEEN
pH: 6 (ref 5.0–8.0)

## 2017-11-30 LAB — COMPREHENSIVE METABOLIC PANEL
ALK PHOS: 64 U/L (ref 38–126)
ALT: 11 U/L — ABNORMAL LOW (ref 17–63)
AST: 13 U/L — AB (ref 15–41)
Albumin: 3.4 g/dL — ABNORMAL LOW (ref 3.5–5.0)
Anion gap: 12 (ref 5–15)
BILIRUBIN TOTAL: 0.6 mg/dL (ref 0.3–1.2)
BUN: 11 mg/dL (ref 6–20)
CALCIUM: 8.5 mg/dL — AB (ref 8.9–10.3)
CO2: 29 mmol/L (ref 22–32)
CREATININE: 1.05 mg/dL (ref 0.61–1.24)
Chloride: 101 mmol/L (ref 101–111)
GFR calc Af Amer: >60 mL/min (ref >60)
Glucose, Bld: 220 mg/dL — ABNORMAL HIGH (ref 65–99)
POTASSIUM: 3 mmol/L — AB (ref 3.5–5.1)
Sodium: 142 mmol/L (ref 135–145)
TOTAL PROTEIN: 6.2 g/dL — AB (ref 6.5–8.1)

## 2017-11-30 LAB — MRSA PCR SCREENING: MRSA BY PCR: NEGATIVE

## 2017-11-30 LAB — GLUCOSE, CAPILLARY
GLUCOSE-CAPILLARY: 160 mg/dL — AB (ref 65–99)
GLUCOSE-CAPILLARY: 128 mg/dL — AB (ref 65–99)
Glucose-Capillary: 160 mg/dL — ABNORMAL HIGH (ref 65–99)
Glucose-Capillary: 148 mg/dL — ABNORMAL HIGH (ref 65–99)

## 2017-11-30 LAB — HIV ANTIBODY (ROUTINE TESTING W REFLEX): HIV Screen 4th Generation wRfx: NONREACTIVE

## 2017-11-30 MED ORDER — LOSARTAN POTASSIUM 25 MG PO TABS
25.0000 mg | ORAL_TABLET | Freq: Every day | ORAL | Status: DC
  Administered 2017-11-30: 25 mg via ORAL
  Filled 2017-11-30: qty 1

## 2017-11-30 MED ORDER — SENNA 8.6 MG PO TABS
1.0000 | ORAL_TABLET | Freq: Every day | ORAL | Status: DC
  Administered 2017-11-30 – 2017-12-02 (×3): 8.6 mg via ORAL
  Filled 2017-11-30 (×3): qty 1

## 2017-11-30 MED ORDER — ACETAMINOPHEN 325 MG PO TABS
325.0000 mg | ORAL_TABLET | Freq: Four times a day (QID) | ORAL | Status: DC | PRN
  Administered 2017-12-01 – 2017-12-02 (×2): 650 mg via ORAL
  Filled 2017-11-30 (×2): qty 2

## 2017-11-30 MED ORDER — ATORVASTATIN CALCIUM 10 MG PO TABS
10.0000 mg | ORAL_TABLET | Freq: Every day | ORAL | Status: DC
  Administered 2017-11-30 – 2017-12-01 (×2): 10 mg via ORAL
  Filled 2017-11-30 (×2): qty 1

## 2017-11-30 MED ORDER — TAMSULOSIN HCL 0.4 MG PO CAPS
0.4000 mg | ORAL_CAPSULE | Freq: Every day | ORAL | Status: DC
  Administered 2017-11-30 – 2017-12-02 (×3): 0.4 mg via ORAL
  Filled 2017-11-30 (×3): qty 1

## 2017-11-30 MED ORDER — VANCOMYCIN HCL 10 G IV SOLR
1500.0000 mg | Freq: Once | INTRAVENOUS | Status: AC
  Administered 2017-11-30: 1500 mg via INTRAVENOUS
  Filled 2017-11-30: qty 1500

## 2017-11-30 MED ORDER — ENSURE ENLIVE PO LIQD
237.0000 mL | Freq: Two times a day (BID) | ORAL | Status: DC
  Administered 2017-11-30 – 2017-12-02 (×5): 237 mL via ORAL

## 2017-11-30 MED ORDER — BISACODYL 10 MG RE SUPP: 10.0000 mg | RECTAL | Status: DC | PRN

## 2017-11-30 MED ORDER — HYDRALAZINE HCL 20 MG/ML IJ SOLN
5.0000 mg | Freq: Four times a day (QID) | INTRAMUSCULAR | Status: DC | PRN
  Administered 2017-11-30 – 2017-12-02 (×4): 5 mg via INTRAVENOUS
  Filled 2017-11-30 (×4): qty 1

## 2017-11-30 MED ORDER — RISPERIDONE 2 MG PO TABS
2.0000 mg | ORAL_TABLET | Freq: Every day | ORAL | Status: DC
  Administered 2017-11-30 – 2017-12-01 (×2): 2 mg via ORAL
  Filled 2017-11-30 (×2): qty 1

## 2017-11-30 MED ORDER — LOSARTAN POTASSIUM 50 MG PO TABS
50.0000 mg | ORAL_TABLET | Freq: Every day | ORAL | Status: DC
  Administered 2017-12-01 – 2017-12-02 (×2): 50 mg via ORAL
  Filled 2017-11-30 (×2): qty 1

## 2017-11-30 MED ORDER — VITAMIN D3 25 MCG (1000 UNIT) PO TABS
1000.0000 [IU] | ORAL_TABLET | Freq: Every morning | ORAL | Status: DC
  Administered 2017-11-30 – 2017-12-02 (×3): 1000 [IU] via ORAL
  Filled 2017-11-30 (×3): qty 1

## 2017-11-30 MED ORDER — IBUPROFEN 800 MG PO TABS
800.0000 mg | ORAL_TABLET | Freq: Once | ORAL | Status: AC
  Administered 2017-11-30: 800 mg via ORAL
  Filled 2017-11-30: qty 1

## 2017-11-30 MED ORDER — POTASSIUM CHLORIDE CRYS ER 20 MEQ PO TBCR
40.0000 meq | EXTENDED_RELEASE_TABLET | Freq: Once | ORAL | Status: AC
  Administered 2017-11-30: 40 meq via ORAL
  Filled 2017-11-30: qty 2

## 2017-11-30 MED ORDER — VANCOMYCIN HCL 10 G IV SOLR: 1500.0000 mg | INTRAVENOUS | Status: DC

## 2017-11-30 MED ORDER — SODIUM CHLORIDE 0.9 % IV BOLUS (SEPSIS)
1000.0000 mL | Freq: Once | INTRAVENOUS | Status: AC
  Administered 2017-11-30: 1000 mL via INTRAVENOUS

## 2017-11-30 MED ORDER — INSULIN GLARGINE 100 UNIT/ML ~~LOC~~ SOLN
5.0000 [IU] | Freq: Every day | SUBCUTANEOUS | Status: DC
  Administered 2017-11-30 – 2017-12-01 (×2): 5 [IU] via SUBCUTANEOUS
  Filled 2017-11-30 (×3): qty 0.05

## 2017-11-30 MED ORDER — SODIUM CHLORIDE 0.9 % IV SOLN
2.0000 g | Freq: Once | INTRAVENOUS | Status: AC
  Administered 2017-11-30: 2 g via INTRAVENOUS
  Filled 2017-11-30: qty 2

## 2017-11-30 MED ORDER — LATANOPROST 0.005 % OP SOLN
1.0000 [drp] | Freq: Every day | OPHTHALMIC | Status: DC
  Administered 2017-11-30 – 2017-12-01 (×3): 1 [drp] via OPHTHALMIC
  Filled 2017-11-30: qty 2.5

## 2017-11-30 MED ORDER — SODIUM CHLORIDE 0.9 % IV BOLUS (SEPSIS): 500.0000 mL | Freq: Once | INTRAVENOUS | Status: DC

## 2017-11-30 MED ORDER — PANTOPRAZOLE SODIUM 40 MG PO TBEC
80.0000 mg | DELAYED_RELEASE_TABLET | Freq: Every day | ORAL | Status: DC
  Administered 2017-11-30 – 2017-12-02 (×3): 80 mg via ORAL
  Filled 2017-11-30 (×3): qty 2

## 2017-11-30 MED ORDER — VANCOMYCIN HCL IN DEXTROSE 1-5 GM/200ML-% IV SOLN: 1000.0000 mg | Freq: Once | INTRAVENOUS | Status: DC

## 2017-11-30 MED ORDER — SODIUM CHLORIDE 0.9 % IV SOLN
1.0000 g | Freq: Three times a day (TID) | INTRAVENOUS | Status: DC
Start: 2017-11-30 — End: 2017-12-02
  Administered 2017-11-30 – 2017-12-02 (×8): 1 g via INTRAVENOUS
  Filled 2017-11-30 (×8): qty 1

## 2017-11-30 MED ORDER — ENOXAPARIN SODIUM 40 MG/0.4ML ~~LOC~~ SOLN
40.0000 mg | SUBCUTANEOUS | Status: DC
  Administered 2017-11-30 – 2017-12-02 (×3): 40 mg via SUBCUTANEOUS
  Filled 2017-11-30 (×3): qty 0.4

## 2017-11-30 MED ORDER — TIMOLOL MALEATE 0.5 % OP SOLN
1.0000 [drp] | Freq: Every day | OPHTHALMIC | Status: DC
  Administered 2017-11-30 – 2017-12-02 (×3): 1 [drp] via OPHTHALMIC
  Filled 2017-11-30: qty 5

## 2017-11-30 MED ORDER — RISPERIDONE 0.5 MG PO TABS
0.2500 mg | ORAL_TABLET | Freq: Every day | ORAL | Status: DC
  Administered 2017-11-30 – 2017-12-02 (×3): 0.25 mg via ORAL
  Filled 2017-11-30 (×3): qty 1

## 2017-11-30 MED ORDER — OLOPATADINE HCL 0.1 % OP SOLN
1.0000 [drp] | Freq: Every day | OPHTHALMIC | Status: DC
  Administered 2017-11-30 – 2017-12-02 (×3): 1 [drp] via OPHTHALMIC
  Filled 2017-11-30: qty 5

## 2017-11-30 MED ORDER — FINASTERIDE 5 MG PO TABS
5.0000 mg | ORAL_TABLET | Freq: Every evening | ORAL | Status: DC
  Administered 2017-11-30 – 2017-12-01 (×2): 5 mg via ORAL
  Filled 2017-11-30 (×2): qty 1

## 2017-11-30 MED ORDER — ACETAMINOPHEN 325 MG PO TABS: 650.0000 mg | ORAL_TABLET | Freq: Once | ORAL | Status: DC

## 2017-11-30 MED ORDER — INSULIN ASPART 100 UNIT/ML ~~LOC~~ SOLN
0.0000 [IU] | Freq: Three times a day (TID) | SUBCUTANEOUS | Status: DC
Start: 2017-11-30 — End: 2017-12-02
  Administered 2017-11-30 (×2): 2 [IU] via SUBCUTANEOUS
  Administered 2017-11-30: 1 [IU] via SUBCUTANEOUS
  Administered 2017-12-01: 2 [IU] via SUBCUTANEOUS
  Administered 2017-12-01: 1 [IU] via SUBCUTANEOUS
  Administered 2017-12-01: 3 [IU] via SUBCUTANEOUS
  Administered 2017-12-02: 2 [IU] via SUBCUTANEOUS

## 2017-11-30 MED ORDER — FUROSEMIDE 40 MG PO TABS
20.0000 mg | ORAL_TABLET | Freq: Every day | ORAL | Status: DC
  Administered 2017-11-30 – 2017-12-02 (×3): 20 mg via ORAL
  Filled 2017-11-30 (×3): qty 1

## 2017-11-30 NOTE — ED Notes (Signed)
ED TO INPATIENT HANDOFF REPORT  Name/Age/Gender Jordan Rowe 72 y.o. male  Code Status    Code Status Orders  (From admission, onward)        Start     Ordered   11/30/17 0108  Full code  Continuous     11/30/17 0119    Code Status History    Date Active Date Inactive Code Status Order ID Comments User Context   08/07/2017 2309 08/11/2017 2202 Full Code [id number]  Lovenia Kim, MD ED   05/08/2017 1959 05/09/2017 2118 DNR [id number]  Truett Mainland, DO Inpatient   01/13/2017 1855 01/15/2017 2214 Full Code [id number]  Mercy Riding, MD Inpatient   09/30/2015 2343 10/07/2015 1751 Full Code [id number]  Rise Patience, MD Inpatient   02/02/2013 0122 02/05/2013 1529 Full Code [id number]  Toy Baker, MD Inpatient      Home/SNF/Other Rehab  Chief Complaint Fever  Level of Care/Admitting Diagnosis ED Disposition    ED Disposition Condition [facility] Area: [facility] [[id number]]  Level of Care: Telemetry [5]  Admit to tele based on following criteria: Other see comments  Comments: h/o CHF, a.fib  Diagnosis: Right lower lobe pneumonia [facility]) [324401]  Admitting Physician: Doreatha Massed  Attending Physician: Etta Quill [phone]  Estimated length of stay: past midnight tomorrow  Certification:: I certify this patient will need inpatient services for at least 2 midnights  PT Class (Do Not Modify): Inpatient [101]  PT Acc Code (Do Not Modify): Private [1]       Medical History Past Medical History:  Diagnosis Date  . Atrial fibrillation with RVR ([place])    Archie Endo 09/30/2015  . BPH (benign prostatic hypertrophy)    Archie Endo 09/30/2015  . Constipation   . Edema   . Fracture of C6 vertebra, closed ([place]) 04/2017  . GERD (gastroesophageal reflux disease)   . Glaucoma   . Hypertension   . ICB (intracranial bleed) ([place]) 04/2017   with fall  . Mental retardation    Archie Endo 09/30/2015  . Schizophrenia ([place])   . Stroke ([place])   .  Type II diabetes mellitus ([place])    Archie Endo 09/30/2015  . Vitamin D deficiency     Allergies No Known Allergies  IV Location/Drains/Wounds Patient Lines/Drains/Airways Status   Active Line/Drains/Airways    Name:   Placement date:   Placement time:   Site:   Days:   Peripheral IV 11/30/17 Left Wrist   11/30/17    0001    Wrist   less than 1   Peripheral IV 11/29/17 Right;Posterior Forearm   11/29/17    2350    Forearm   1          Labs/Imaging Results for orders placed or performed during the hospital encounter of 11/29/17 (from the past 48 hour(s))  Comprehensive metabolic panel     Status: Abnormal   Collection Time: 11/29/17 11:59 PM  Result Value Ref Range   Sodium 142 135 - 145 mmol/L   Potassium 3.0 (L) 3.5 - 5.1 mmol/L   Chloride 101 101 - 111 mmol/L   CO2 29 22 - 32 mmol/L   Glucose, Bld 220 (H) 65 - 99 mg/dL   BUN 11 6 - 20 mg/dL   Creatinine, Ser 1.05 0.61 - 1.24 mg/dL   Calcium 8.5 (L) 8.9 - 10.3 mg/dL   Total Protein 6.2 (L) 6.5 - 8.1 g/dL   Albumin 3.4 (L) 3.5 - 5.0 g/dL  AST 13 (L) 15 - 41 U/L   ALT 11 (L) 17 - 63 U/L   Alkaline Phosphatase 64 38 - 126 U/L   Total Bilirubin 0.6 0.3 - 1.2 mg/dL   GFR calc non Af Amer >60 >60 mL/min   GFR calc Af Amer >60 >60 mL/min    Comment: (NOTE) The eGFR has been calculated using the CKD EPI equation. This calculation has not been validated in all clinical situations. eGFR's persistently <60 mL/min signify possible Chronic Kidney Disease.    Anion gap 12 5 - 15    Comment: Performed at [facility], [place] [address], [place], [place] [address]  CBC with Differential     Status: None   Collection Time: 11/29/17 11:59 PM  Result Value Ref Range   WBC 5.7 4.0 - 10.5 K/uL   RBC 4.43 4.22 - 5.81 MIL/uL   Hemoglobin 13.6 13.0 - 17.0 g/dL   HCT 41.8 39.0 - 52.0 %   MCV 94.4 78.0 - 100.0 fL   MCH 30.7 26.0 - 34.0 pg   MCHC 32.5 30.0 - 36.0 g/dL   RDW 13.5 11.5 - 15.5 %   Platelets 184 150 - 400 K/uL    Neutrophils Relative % 52 %   Neutro Abs 3.0 1.7 - 7.7 K/uL   Lymphocytes Relative 33 %   Lymphs Abs 1.9 0.7 - 4.0 K/uL   Monocytes Relative 13 %   Monocytes Absolute 0.7 0.1 - 1.0 K/uL   Eosinophils Relative 2 %   Eosinophils Absolute 0.1 0.0 - 0.7 K/uL   Basophils Relative 0 %   Basophils Absolute 0.0 0.0 - 0.1 K/uL    Comment: Performed at [facility], [place] [address], [place], [place] [address]  Protime-INR     Status: None   Collection Time: 11/29/17 11:59 PM  Result Value Ref Range   Prothrombin Time 13.7 11.4 - 15.2 seconds   INR 1.06     Comment: Performed at [facility], [place] [address] [place], [place] [address]  I-Stat CG4 Lactic Acid, ED     Status: Abnormal   Collection Time: 11/30/17 12:10 AM  Result Value Ref Range   Lactic Acid, Venous 2.95 (HH) 0.5 - 1.9 mmol/L   Comment NOTIFIED PHYSICIAN   Urinalysis, Routine w reflex microscopic     Status: Abnormal   Collection Time: 11/30/17 12:44 AM  Result Value Ref Range   Color, Urine YELLOW YELLOW   APPearance CLEAR CLEAR   Specific Gravity, Urine 1.025 1.005 - 1.030   pH 6.0 5.0 - 8.0   Glucose, UA 150 (A) NEGATIVE mg/dL   Hgb urine dipstick SMALL (A) NEGATIVE   Bilirubin Urine NEGATIVE NEGATIVE   Ketones, ur 5 (A) NEGATIVE mg/dL   Protein, ur NEGATIVE NEGATIVE mg/dL   Nitrite NEGATIVE NEGATIVE   Leukocytes, UA NEGATIVE NEGATIVE   RBC / HPF 6-30 0 - 5 RBC/hpf   WBC, UA 0-5 0 - 5 WBC/hpf   Bacteria, UA NONE SEEN NONE SEEN   Squamous Epithelial / LPF NONE SEEN NONE SEEN   Mucus PRESENT     Comment: Performed at [facility], [place] [address], [place],  [address]   Dg Chest 2 View  Result Date: 11/30/2017 CLINICAL DATA:  Fever today. History of hypertension and diabetes. Smoker. EXAM: CHEST - 2 VIEW COMPARISON:  08/07/2017 FINDINGS: Shallow inspiration. Mild cardiac enlargement. Infiltration in the right lung base likely to represent pneumonia. No  blunting of  costophrenic angles. No pneumothorax. Mediastinal contours appear intact. Multiple old right rib fractures. Degenerative changes in the spine and shoulders. Aortic calcification. IMPRESSION: 1. Infiltration in the right lower lung likely represents pneumonia. 2. Mild cardiac enlargement. 3. Aortic atherosclerosis. Electronically Signed   By: Lucienne Capers M.D.   On: 11/30/2017 00:32    Pending Labs Unresulted Labs (From admission, onward)   Start     Ordered   11/30/17 0105  Culture, sputum-assessment  Once,   R     11/30/17 0119   11/30/17 0105  Gram stain  Once,   R     11/30/17 0119   11/30/17 0105  HIV antibody (Routine Screening)  Once,   R     11/30/17 0119   11/30/17 0105  Strep pneumoniae urinary antigen  Once,   R     11/30/17 0119   11/29/17 2342  Culture, blood (Routine x 2)  BLOOD CULTURE X 2,   STAT     11/29/17 2342      Vitals/Pain Today's Vitals   11/30/17 0006 11/30/17 0008 11/30/17 0100 11/30/17 0200  BP:   (!) 160/100 (!) 192/107  Pulse:   82 85  Resp:   (!) 22 (!) 24  Temp:      TempSrc:      SpO2: 99%  97% 100%  Weight:  175 lb (79.4 kg)    Height:  '5\' 9"'$  (1.753 m)    PainSc:        Isolation Precautions No active isolations  Medications Medications  vancomycin (VANCOCIN) 1,500 mg in sodium chloride 0.9 % 500 mL IVPB (1,500 mg Intravenous New Bag/Given 11/30/17 0128)  insulin aspart (novoLOG) injection 0-9 Units (has no administration in time range)  enoxaparin (LOVENOX) injection 40 mg (has no administration in time range)  atorvastatin (LIPITOR) tablet 10 mg (has no administration in time range)  acetaminophen (TYLENOL) tablet 325-650 mg (has no administration in time range)  bisacodyl (DULCOLAX) suppository 10 mg (has no administration in time range)  cholecalciferol (VITAMIN D) tablet 1,000 Units (has no administration in time range)  finasteride (PROSCAR) tablet 5 mg (has no administration in time range)  insulin glargine (LANTUS)  injection 5 Units (has no administration in time range)  latanoprost (XALATAN) 0.005 % ophthalmic solution 1 drop (has no administration in time range)  olopatadine (PATANOL) 0.1 % ophthalmic solution 1 drop (has no administration in time range)  pantoprazole (PROTONIX) EC tablet 80 mg (has no administration in time range)  risperiDONE (RISPERDAL) tablet 0.25 mg (has no administration in time range)  risperiDONE (RISPERDAL) tablet 2 mg (has no administration in time range)  tamsulosin (FLOMAX) capsule 0.4 mg (has no administration in time range)  timolol (BETIMOL) 0.5 % ophthalmic solution 1 drop (has no administration in time range)  senna (SENOKOT) tablet 8.6 mg (has no administration in time range)  potassium chloride SA (K-DUR,KLOR-CON) CR tablet 40 mEq (has no administration in time range)  furosemide (LASIX) tablet 20 mg (has no administration in time range)  losartan (COZAAR) tablet 25 mg (has no administration in time range)  sodium chloride 0.9 % bolus 1,000 mL (0 mLs Intravenous Stopped 11/30/17 0127)    And  sodium chloride 0.9 % bolus 1,000 mL (1,000 mLs Intravenous New Bag/Given 11/30/17 0035)  ceFEPIme (MAXIPIME) 2 g in sodium chloride 0.9 % 100 mL IVPB (0 g Intravenous Stopped 11/30/17 0127)  ibuprofen (ADVIL,MOTRIN) tablet 800 mg (800 mg Oral Given 11/30/17 0128)    Mobility Non-ambulatory currently

## 2017-11-30 NOTE — Progress Notes (Signed)
Initial Nutrition Assessment  DOCUMENTATION CODES:   Not applicable  INTERVENTION:   Continue Ensure Enlive po BID, each supplement provides 350 kcal and 20 grams of protein  NUTRITION DIAGNOSIS:   Increased nutrient needs related to acute illness as evidenced by estimated needs  GOAL:   Patient will meet greater than or equal to 90% of their needs  MONITOR:   PO intake, Supplement acceptance, Skin, I & O's, Labs  REASON FOR ASSESSMENT:   Malnutrition Screening Tool   ASSESSMENT:   Pt with PMH of schizophrenia, DM, HTN, GERD, Afib, and stroke [facility] from [facility] with RLL PNA   Pt reports a fair appetite currently but that he did not eat 100% of breakfast this morning. Pt consumed eggs, bacon, half bagel and few bites of oatmeal.   Pt consuming Ensure supplement during visit, amenable to continued supplementation.   Pt reports no known recent changes to weight status. Weight records seem fairly stable.   Labs reviewed; CBG 160, K 3.0, Albumin 3.4 Medications reviewed; vitamin D, Lasix, sliding scale insulin, Lantus, Senokot   NUTRITION - FOCUSED PHYSICAL EXAM:    Most Recent Value  Orbital Region  No depletion  Upper Arm Region  No depletion  Thoracic and Lumbar Region  No depletion  Buccal Region  No depletion  Temple Region  Mild depletion  Clavicle Bone Region  No depletion  Clavicle and Acromion Bone Region  No depletion  Scapular Bone Region  No depletion  Dorsal Hand  No depletion  Patellar Region  No depletion  Anterior Thigh Region  No depletion  Posterior Calf Region  No depletion  Edema (RD Assessment)  None     Diet Order:  Diet Carb Modified Fluid consistency: Thin; Room service appropriate? Yes  EDUCATION NEEDS:   Not appropriate for education at this time  Skin:  Skin Assessment: Reviewed RN Assessment  Last BM:  11/29/17  Height:   Ht Readings from Last 1 Encounters:  11/30/17 5\' 9"  (1.753 m)   Weight:   Wt Readings  from Last 1 Encounters:  11/30/17 175 lb (79.4 kg)   Ideal Body Weight:  72.7 kg  BMI:  Body mass index is 25.84 kg/m.  Estimated Nutritional Needs:   Kcal:  0156-1537  Protein:  95-105 grams  Fluid:  >/= 2 L/d  Fransisca Kaufmann, MS, RDN, LDN 11/30/2017 2:16 PM

## 2017-11-30 NOTE — H&P (Addendum)
History and Physical    Nolan Tuazon WJX:[id number] DOB: [birth year] DOA: 11/29/2017  PCP: Patient, No Pcp Per  Patient coming from: SNF  I have personally briefly reviewed patient's old medical records in [facility]  Chief Complaint: Fever  HPI: Depaul Arizpe is a 72 y.o. male with medical history significant of DM2, HTN, a.fib, stroke, schizophrenia.  Patient presents to the ED from SNF for high fevers noted today.  Patient doesn't know why he is in ED today though seems otherwise oriented.  Patient denies symptoms of cough, congestion, abd pain.   ED Course: Patient initially more somnolent, Tm 101.2 BP 160/100.  CXR shows RLL PNA.  Lactate 2.95.  EDP started 30ml /kg bolus.  Mental status improved.   Review of Systems: As per HPI otherwise 10 point review of systems negative.   Past Medical History:  Diagnosis Date  . Atrial fibrillation with RVR (HCC)    [name] 09/30/2015  . BPH (benign prostatic hypertrophy)    [name] 09/30/2015  . Constipation   . Edema   . Fracture of C6 vertebra, closed (HCC) 04/2017  . GERD (gastroesophageal reflux disease)   . Glaucoma   . Hypertension   . ICB (intracranial bleed) (HCC) 04/2017   with fall  . Mental retardation    [name] 09/30/2015  . Schizophrenia (HCC)   . Stroke (HCC)   . Type II diabetes mellitus (HCC)    [name] 09/30/2015  . Vitamin D deficiency     Past Surgical History:  Procedure Laterality Date  . CATARACT EXTRACTION Left 12/2002   [name] 01/12/2011  . CATARACT EXTRACTION W/ INTRAOCULAR LENS IMPLANT Right 01/2011   [name] 02/18/2011  . MULTIPLE TOOTH EXTRACTIONS       reports that he has been smoking.  He has never used smokeless tobacco. He reports that he does not drink alcohol or use drugs.  No Known Allergies  Family History  Problem Relation Age of Onset  . Hypertension Mother      Prior to Admission medications   Medication Sig Start Date End Date Taking? Authorizing Provider  acetaminophen  (TYLENOL) 325 MG tablet Take 325-650 mg by mouth every 6 (six) hours as needed for mild pain or moderate pain.    Yes [provider]  atorvastatin (LIPITOR) 10 MG tablet Take 10 mg by mouth daily.   Yes [provider]  bisacodyl (DULCOLAX) 10 MG suppository Place 10 mg rectally as needed for moderate constipation.   Yes [provider]  cholecalciferol (VITAMIN D) 1000 UNITS tablet Take 1,000 Units by mouth every morning.    Yes [provider]  finasteride (PROSCAR) 5 MG tablet Take 5 mg by mouth every evening.    Yes [provider]  furosemide (LASIX) 20 MG tablet Take 20 mg by mouth daily.   Yes [provider]  insulin glargine (LANTUS) 100 UNIT/ML injection Inject 0.05 mLs (5 Units total) into the skin at bedtime. Hold is FSBS is less than 75 10/07/15  Yes Lendell Caprice, Corinna L, MD  latanoprost (XALATAN) 0.005 % ophthalmic solution Place 1 drop into both eyes at bedtime.   Yes [provider]  losartan (COZAAR) 25 MG tablet Take 1 tablet (25 mg total) by mouth daily. 08/11/17  Yes Lockamy, Timothy, DO  olopatadine (PATANOL) 0.1 % ophthalmic solution Place 1 drop into both eyes every morning.   Yes [provider]  omeprazole (PRILOSEC) 20 MG capsule Take 40 mg by mouth every evening. 30 min prior to  evening meal.   Yes [provider]  Polyvinyl Alcohol-Povidone (REFRESH OP) Place 1 drop into both eyes 2 (two) times daily.   Yes [provider]  risperiDONE (RISPERDAL) 0.25 MG tablet Take 0.25 mg by mouth every morning.    Yes [provider]  risperiDONE (RISPERDAL) 2 MG tablet Take 2 mg by mouth at bedtime.   Yes [provider]  senna (SENOKOT) 8.6 MG TABS Take 1 tablet by mouth daily.    Yes [provider]  tamsulosin (FLOMAX) 0.4 MG CAPS capsule Take 1 capsule (0.4 mg total) by mouth daily. 10/22/16  Yes West, Emily, PA-C  timolol (BETIMOL) 0.5 % ophthalmic solution Place 1  drop into both eyes daily.   Yes [provider]    Physical Exam: Vitals:   11/29/17 2340 11/30/17 0006 11/30/17 0008 11/30/17 0100  BP: (!) 162/94   (!) 160/100  Pulse: 77   82  Resp: (!) 22   (!) 22  Temp: (!) 101.2 F (38.4 C)     TempSrc: Rectal     SpO2: 99% 99%  97%  Weight:   79.4 kg (175 lb)   Height:   5\' 9"  (1.753 m)     Constitutional: NAD, calm, comfortable Eyes: PERRL, lids and conjunctivae normal ENMT: Mucous membranes are moist. Posterior pharynx clear of any exudate or lesions.Normal dentition.  Neck: normal, supple, no masses, no thyromegaly Respiratory: RLL crackles Cardiovascular: Regular rate and rhythm, no murmurs / rubs / gallops. No extremity edema. 2+ pedal pulses. No carotid bruits.  Abdomen: no tenderness, no masses palpated. No hepatosplenomegaly. Bowel sounds positive.  Musculoskeletal: no clubbing / cyanosis. No joint deformity upper and lower extremities. Good ROM, no contractures. Normal muscle tone.  Skin: no rashes, lesions, ulcers. No induration Neurologic: CN 2-12 grossly intact. Sensation intact, DTR normal. Strength 5/5 in all 4.  Psychiatric: Normal judgment and insight. Alert and oriented x 3. Normal mood.    Labs on Admission: I have personally reviewed following labs and imaging studies  CBC: Recent Labs  Lab 11/29/17 2359  WBC 5.7  NEUTROABS 3.0  HGB 13.6  HCT 41.8  MCV 94.4  PLT 184   Basic Metabolic Panel: Recent Labs  Lab 11/29/17 2359  NA 142  K 3.0*  CL 101  CO2 29  GLUCOSE 220*  BUN 11  CREATININE 1.05  CALCIUM 8.5*   GFR: Estimated Creatinine Clearance: 66.4 mL/min (by C-G formula based on SCr of 1.05 mg/dL). Liver Function Tests: Recent Labs  Lab 11/29/17 2359  AST 13*  ALT 11*  ALKPHOS 64  BILITOT 0.6  PROT 6.2*  ALBUMIN 3.4*   No results for input(s): LIPASE, AMYLASE in the last 168 hours. No results for input(s): AMMONIA in the last 168 hours. Coagulation Profile: Recent Labs  Lab  11/29/17 2359  INR 1.06   Cardiac Enzymes: No results for input(s): CKTOTAL, CKMB, CKMBINDEX, TROPONINI in the last 168 hours. BNP (last 3 results) No results for input(s): PROBNP in the last 8760 hours. HbA1C: No results for input(s): HGBA1C in the last 72 hours. CBG: No results for input(s): GLUCAP in the last 168 hours. Lipid Profile: No results for input(s): CHOL, HDL, LDLCALC, TRIG, CHOLHDL, LDLDIRECT in the last 72 hours. Thyroid Function Tests: No results for input(s): TSH, T4TOTAL, FREET4, T3FREE, THYROIDAB in the last 72 hours. Anemia Panel: No results for input(s): VITAMINB12, FOLATE, FERRITIN, TIBC, IRON, RETICCTPCT in the last 72 hours. Urine analysis:    Component Value Date/Time  COLORURINE YELLOW 11/30/2017 0044   APPEARANCEUR CLEAR 11/30/2017 0044   LABSPEC 1.025 11/30/2017 0044   PHURINE 6.0 11/30/2017 0044   GLUCOSEU 150 (A) 11/30/2017 0044   HGBUR SMALL (A) 11/30/2017 0044   BILIRUBINUR NEGATIVE 11/30/2017 0044   KETONESUR 5 (A) 11/30/2017 0044   PROTEINUR NEGATIVE 11/30/2017 0044   UROBILINOGEN 1.0 02/01/2013 2300   NITRITE NEGATIVE 11/30/2017 0044   LEUKOCYTESUR NEGATIVE 11/30/2017 0044    Radiological Exams on Admission: Dg Chest 2 View  Result Date: 11/30/2017 CLINICAL DATA:  Fever today. History of hypertension and diabetes. Smoker. EXAM: CHEST - 2 VIEW COMPARISON:  08/07/2017 FINDINGS: Shallow inspiration. Mild cardiac enlargement. Infiltration in the right lung base likely to represent pneumonia. No blunting of costophrenic angles. No pneumothorax. Mediastinal contours appear intact. Multiple old right rib fractures. Degenerative changes in the spine and shoulders. Aortic calcification. IMPRESSION: 1. Infiltration in the right lower lung likely represents pneumonia. 2. Mild cardiac enlargement. 3. Aortic atherosclerosis. Electronically Signed   By: Burman Nieves M.D.   On: 11/30/2017 00:32    EKG: Independently  reviewed.  Assessment/Plan Principal Problem:   Right lower lobe pneumonia (HCC) Active Problems:   MENTAL RETARDATION   Essential hypertension   Diabetes mellitus type 2, controlled (HCC)   Atrial fibrillation (HCC)   Schizophrenia (HCC)   Chronic systolic CHF (congestive heart failure) (HCC)    1. RLL HCAP - 1. PNA pathway 2. Cefepime and vanc 3. Tylenol PRN fever 4. Cultures pending 5. IVF: got most of a 30 cc/kg bolus in ED, stopped before the last 500cc was given due to patient having BPs 177/110, being wide awake and needing to urinate (felt patient wasn't ever really septic, lactate was only 2.9). 2. HTN - 1. Continue home meds 3. Chronic systolic CHF - 1. Will resume home lasix.  Again note that patient just got large amount of fluid in ED. 4. Schizophrenia and MR - continue home meds 5. DM2 - 1. Continue Lantus 5 2. Sensitive SSI AC 6. A.Fib - 1. Tele monitor for next 24 hrs 2. Rate controlled 3. Not on anticoagulation presumably due to traumatic SAH last year secondary to fall  DVT prophylaxis: Lovenox Code Status: Full, actually patient has h/o being on hospice in past due to cardiomyopathy apparently, but repeat Echo showed improvement and so patient taken off of hospice in Dec it seems. Family Communication: No family in room Disposition Plan: SNF after admit Consults called: None Admission status: Admit to inpatient   Hillary Bow DO [facility] Pager [phone]  If 7AM-7PM, please contact day team taking care of patient [email or web address] Password [email or web address]  11/30/2017, 2:02 AM

## 2017-11-30 NOTE — Progress Notes (Signed)
Pharmacy Antibiotic Note  Jordan Rowe is a 72 y.o. male admitted on 11/29/2017 with pneumonia.  Pharmacy has been consulted for cefepime and vancomycin dosing.  Plan: Cefepime 2 gm x1 then 1 Gm IV q8h Vancomycin 1500 mg IV q24h for est AUC = 489 Goal AUC = 400-500 F/u scr/cultures/levels  Height: 5\' 9"  (175.3 cm) Weight: 175 lb (79.4 kg) IBW/kg (Calculated) : 70.7  Temp (24hrs), Avg:100 F (37.8 C), Min:98.7 F (37.1 C), Max:101.2 F (38.4 C)  Recent Labs  Lab 11/29/17 2359 11/30/17 0010  WBC 5.7  --   CREATININE 1.05  --   LATICACIDVEN  --  2.95*    Estimated Creatinine Clearance: 66.4 mL/min (by C-G formula based on SCr of 1.05 mg/dL).    No Known Allergies  Antimicrobials this admission: 4/3 cefepime >>  4/3 vancomycin >>   Dose adjustments this admission:   Microbiology results:  BCx:   UCx:    Sputum:    MRSA PCR:  Thank you for allowing pharmacy to be a part of this patient's care.  Lorenza Evangelist 11/30/2017 4:36 AM

## 2017-11-30 NOTE — ED Provider Notes (Signed)
[facility]-EMERGENCY DEPT Provider Note   CSN: [id number] Arrival date & time: 11/29/17  2323     History   Chief Complaint Chief Complaint  Patient presents with  . Fever    HPI Hansen Carino is a 72 y.o. male.  The history is provided by the patient and medical records. No language interpreter was used.  Fever     Rayder Sullenger is a 72 y.o. male  with a PMH of DM2, HTN, afib, prior stroke, schizophrenia who presents to the Emergency Department from nursing facility for high fevers noted today.  Patient unable to provide much history.  He does not know why he is in the emergency department today.  He denies any symptoms including cough, congestion, abdominal pain.  Did not know he had a fever.  Level V caveat applies 2/2 acuity of condition.    Past Medical History:  Diagnosis Date  . Atrial fibrillation with RVR (HCC)    [name] 09/30/2015  . BPH (benign prostatic hypertrophy)    [name] 09/30/2015  . Constipation   . Edema   . Fracture of C6 vertebra, closed (HCC) 04/2017  . GERD (gastroesophageal reflux disease)   . Glaucoma   . Hypertension   . ICB (intracranial bleed) (HCC) 04/2017   with fall  . Mental retardation    [name] 09/30/2015  . Schizophrenia (HCC)   . Stroke (HCC)   . Type II diabetes mellitus (HCC)    [name] 09/30/2015  . Vitamin D deficiency     Patient Active Problem List   Diagnosis Date Noted  . Chronic systolic CHF (congestive heart failure) (HCC) 11/30/2017  . Right lower lobe pneumonia (HCC) 11/30/2017  . Bradycardia 08/07/2017  . Chronic diastolic CHF (congestive heart failure) (HCC) 05/09/2017  . BPH (benign prostatic hyperplasia) 05/09/2017  . Subarachnoid bleed (HCC) 05/09/2017  . Traumatic closed fracture of C6 vertebra with minimal displacement, initial encounter (HCC) 05/08/2017  . Chronic anticoagulation 05/08/2017  . Hematoma of oral cavity 05/08/2017  . Fall   . Stroke-like symptom 01/13/2017  . NSVT  (nonsustained ventricular tachycardia) (HCC) 10/05/2015  . Cardiomyopathy (HCC)   . Smoker 10/01/2015  . Schizophrenia (HCC)   . Diabetes mellitus type 2, controlled (HCC) 09/30/2015  . Atrial fibrillation (HCC) 09/30/2015  . Pain in the chest   . 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 Diabetes mellitus (HCC) 02/02/2013  . Dehydration 02/02/2013  . MENTAL RETARDATION 02/12/2009  . Essential hypertension 02/12/2009  . GERD 02/12/2009  . CONSTIPATION 02/12/2009    Past Surgical History:  Procedure Laterality Date  . CATARACT EXTRACTION Left 12/2002   [name] 01/12/2011  . CATARACT EXTRACTION W/ INTRAOCULAR LENS IMPLANT Right 01/2011   [name] 02/18/2011  . MULTIPLE TOOTH EXTRACTIONS          Home Medications    Prior to Admission medications   Medication Sig Start Date End Date Taking? Authorizing Provider  acetaminophen (TYLENOL) 325 MG tablet Take 325-650 mg by mouth every 6 (six) hours as needed for mild pain or moderate pain.    Yes [provider]  atorvastatin (LIPITOR) 10 MG tablet Take 10 mg by mouth daily.   Yes [provider]  bisacodyl (DULCOLAX) 10 MG suppository Place 10 mg rectally as needed for moderate constipation.   Yes [provider]  cholecalciferol (VITAMIN D) 1000 UNITS tablet Take 1,000 Units by mouth every morning.    Yes [provider]  finasteride (PROSCAR) 5 MG tablet Take 5 mg by mouth  every evening.    Yes [provider]  furosemide (LASIX) 20 MG tablet Take 20 mg by mouth daily.   Yes [provider]  insulin glargine (LANTUS) 100 UNIT/ML injection Inject 0.05 mLs (5 Units total) into the skin at bedtime. Hold is FSBS is less than 75 10/07/15  Yes Lendell Caprice, Corinna L, MD  latanoprost (XALATAN) 0.005 % ophthalmic solution Place 1 drop into both eyes at bedtime.   Yes [provider]  losartan (COZAAR) 25 MG tablet Take 1 tablet (25 mg total) by mouth daily. 08/11/17  Yes Lockamy, Timothy, DO  olopatadine  (PATANOL) 0.1 % ophthalmic solution Place 1 drop into both eyes every morning.   Yes [provider]  omeprazole (PRILOSEC) 20 MG capsule Take 40 mg by mouth every evening. 30 min prior to evening meal.   Yes [provider]  Polyvinyl Alcohol-Povidone (REFRESH OP) Place 1 drop into both eyes 2 (two) times daily.   Yes [provider]  risperiDONE (RISPERDAL) 0.25 MG tablet Take 0.25 mg by mouth every morning.    Yes [provider]  risperiDONE (RISPERDAL) 2 MG tablet Take 2 mg by mouth at bedtime.   Yes [provider]  senna (SENOKOT) 8.6 MG TABS Take 1 tablet by mouth daily.    Yes [provider]  tamsulosin (FLOMAX) 0.4 MG CAPS capsule Take 1 capsule (0.4 mg total) by mouth daily. 10/22/16  Yes West, Emily, PA-C  timolol (BETIMOL) 0.5 % ophthalmic solution Place 1 drop into both eyes daily.   Yes [provider]    Family History Family History  Problem Relation Age of Onset  . Hypertension Mother     Social History Social History   Tobacco Use  . Smoking status: Current Every Day Smoker  . Smokeless tobacco: Never Used  Substance Use Topics  . Alcohol use: No  . Drug use: No     Allergies   Patient has no known allergies.   Review of Systems Review of Systems  Unable to perform ROS: Acuity of condition  Constitutional: Positive for fever.     Physical Exam Updated Vital Signs BP (!) 160/100   Pulse 82   Temp (!) 101.2 F (38.4 C) (Rectal)   Resp (!) 22   Ht 5\' 9"  (1.753 m)   Wt 79.4 kg (175 lb)   SpO2 97%   BMI 25.84 kg/m   Physical Exam  Constitutional: He is oriented to person, place, and time. He appears well-developed and well-nourished. No distress.  HENT:  Head: Normocephalic and atraumatic.  Cardiovascular: Normal rate, regular rhythm and normal heart sounds.  No murmur heard. Pulmonary/Chest: Effort normal. No respiratory distress.  Crackles to right lower lung field.  Abdominal:  Soft. He exhibits no distension. There is no tenderness.  Musculoskeletal: He exhibits no edema.  Neurological: He is alert and oriented to person, place, and time.  Skin: Skin is warm and dry.  Nursing note and vitals reviewed.    ED Treatments / Results  Labs (all labs ordered are listed, but only abnormal results are displayed) Labs Reviewed  COMPREHENSIVE METABOLIC PANEL - Abnormal; Notable for the following components:      Result Value   Potassium 3.0 (*)    Glucose, Bld 220 (*)    Calcium 8.5 (*)    Total Protein 6.2 (*)    Albumin 3.4 (*)    AST 13 (*)    ALT 11 (*)    All other components within  normal limits  URINALYSIS, ROUTINE W REFLEX MICROSCOPIC - Abnormal; Notable for the following components:   Glucose, UA 150 (*)    Hgb urine dipstick SMALL (*)    Ketones, ur 5 (*)    All other components within normal limits  I-STAT CG4 LACTIC ACID, ED - Abnormal; Notable for the following components:   Lactic Acid, Venous 2.95 (*)    All other components within normal limits  CULTURE, BLOOD (ROUTINE X 2)  CULTURE, BLOOD (ROUTINE X 2)  CULTURE, EXPECTORATED SPUTUM-ASSESSMENT  GRAM STAIN  CBC WITH DIFFERENTIAL/PLATELET  PROTIME-INR  HIV ANTIBODY (ROUTINE TESTING)  STREP PNEUMONIAE URINARY ANTIGEN  I-STAT CG4 LACTIC ACID, ED    EKG None  Radiology Dg Chest 2 View  Result Date: 11/30/2017 CLINICAL DATA:  Fever today. History of hypertension and diabetes. Smoker. EXAM: CHEST - 2 VIEW COMPARISON:  08/07/2017 FINDINGS: Shallow inspiration. Mild cardiac enlargement. Infiltration in the right lung base likely to represent pneumonia. No blunting of costophrenic angles. No pneumothorax. Mediastinal contours appear intact. Multiple old right rib fractures. Degenerative changes in the spine and shoulders. Aortic calcification. IMPRESSION: 1. Infiltration in the right lower lung likely represents pneumonia. 2. Mild cardiac enlargement. 3. Aortic atherosclerosis. Electronically  Signed   By: Burman Nieves M.D.   On: 11/30/2017 00:32    Procedures Procedures (including critical care time)  CRITICAL CARE Performed by: Chase Picket Ward  Total critical care time: 35 minutes  Critical care time was exclusive of separately billable procedures and treating other patients.  Critical care was necessary to treat or prevent imminent or life-threatening deterioration.  Critical care was time spent personally by me on the following activities: development of treatment plan with patient and/or surrogate as well as nursing, discussions with consultants, evaluation of patient's response to treatment, examination of patient, obtaining history from patient or surrogate, ordering and performing treatments and interventions, ordering and review of laboratory studies, ordering and review of radiographic studies, pulse oximetry and re-evaluation of patient's condition.   Medications Ordered in ED Medications  sodium chloride 0.9 % bolus 1,000 mL (0 mLs Intravenous Stopped 11/30/17 0127)    And  sodium chloride 0.9 % bolus 1,000 mL (1,000 mLs Intravenous New Bag/Given 11/30/17 0035)    And  sodium chloride 0.9 % bolus 500 mL (has no administration in time range)  vancomycin (VANCOCIN) 1,500 mg in sodium chloride 0.9 % 500 mL IVPB (1,500 mg Intravenous New Bag/Given 11/30/17 0128)  insulin aspart (novoLOG) injection 0-9 Units (has no administration in time range)  enoxaparin (LOVENOX) injection 40 mg (has no administration in time range)  ceFEPIme (MAXIPIME) 2 g in sodium chloride 0.9 % 100 mL IVPB (0 g Intravenous Stopped 11/30/17 0127)  ibuprofen (ADVIL,MOTRIN) tablet 800 mg (800 mg Oral Given 11/30/17 0128)     Initial Impression / Assessment and Plan / ED Course  I have reviewed the triage vital signs and the nursing notes.  Pertinent labs & imaging results that were available during my care of the patient were reviewed by me and considered in my medical decision making (see  chart for details).    Kathan Kirker is a 72 y.o. male who presents to ED from nursing facility for fever. On exam, patient with temp of 101.2, tachypneic with crackles to right lung base. Code sepsis initiated. ABX for HCAP and fluids initiated following blood cx's. Lactic of 2.95. Normal white count. UA without signs of infection. CXR with right lower lung infiltrate c/w history and examination. Hospitalist consulted  who will admit.     Patient discussed with Dr. Judd Lien who agrees with treatment plan.    Final Clinical Impressions(s) / ED Diagnoses   Final diagnoses:  Pneumonia of right lower lobe due to infectious organism [facility])    ED Discharge Orders    None       Ward, Chase Picket, PA-C 11/30/17 1941    Geoffery Lyons, MD 11/30/17 [phone]

## 2017-11-30 NOTE — Plan of Care (Signed)
  Problem: Activity: Goal: Risk for activity intolerance will decrease Outcome: Progressing   Problem: Elimination: Goal: Will not experience complications related to bowel motility Outcome: Progressing Goal: Will not experience complications related to urinary retention Outcome: Progressing   Problem: Pain Managment: Goal: General experience of comfort will improve Outcome: Progressing   Problem: Safety: Goal: Ability to remain free from injury will improve Outcome: Progressing   Problem: Skin Integrity: Goal: Risk for impaired skin integrity will decrease Outcome: Progressing   

## 2017-11-30 NOTE — Progress Notes (Signed)
A consult was received from an ED physician for cefepime and vancomycin per pharmacy dosing.  The patient's profile has been reviewed for ht/wt/allergies/indication/available labs.   A one time order has been placed for Cefepime 2 gm and Vancomycin 1500 mg.  Further antibiotics/pharmacy consults should be ordered by admitting physician if indicated.                       Thank you, Lorenza Evangelist 11/30/2017  1:00 AM

## 2017-11-30 NOTE — Progress Notes (Signed)
[facility]   Patient admitted after midnight by my partner, see H&P for further details.  Mr. Pandya is a 72 year old male who was admitted with fevers he is SNF.  Upon ED evaluation found to have right lower lobe pneumonia and admitted for further treatment.  She was started on cefepime and vancomycin, MRSA PCR negative will discontinue vancomycin continue cefepime for 24 more hours and assess clinically.  We will continue to trend fevers.   Rest per H&P  Latrelle Dodrill, MD

## 2017-12-01 LAB — GLUCOSE, CAPILLARY
GLUCOSE-CAPILLARY: 204 mg/dL — AB (ref 65–99)
Glucose-Capillary: 132 mg/dL — ABNORMAL HIGH (ref 65–99)
Glucose-Capillary: 182 mg/dL — ABNORMAL HIGH (ref 65–99)
Glucose-Capillary: 182 mg/dL — ABNORMAL HIGH (ref 65–99)

## 2017-12-01 LAB — BASIC METABOLIC PANEL
ANION GAP: 8 (ref 5–15)
BUN: 15 mg/dL (ref 6–20)
CO2: 25 mmol/L (ref 22–32)
Calcium: 8.8 mg/dL — ABNORMAL LOW (ref 8.9–10.3)
Chloride: 105 mmol/L (ref 101–111)
Creatinine, Ser: 1.02 mg/dL (ref 0.61–1.24)
Glucose, Bld: 205 mg/dL — ABNORMAL HIGH (ref 65–99)
Potassium: 3.5 mmol/L (ref 3.5–5.1)
SODIUM: 138 mmol/L (ref 135–145)

## 2017-12-01 LAB — CBC WITH DIFFERENTIAL/PLATELET
BASOS ABS: 0 10*3/uL (ref 0.0–0.1)
Basophils Relative: 0 %
EOS PCT: 3 %
Eosinophils Absolute: 0.1 10*3/uL (ref 0.0–0.7)
HCT: 41.7 % (ref 39.0–52.0)
HEMOGLOBIN: 14.1 g/dL (ref 13.0–17.0)
LYMPHS ABS: 1.3 10*3/uL (ref 0.7–4.0)
LYMPHS PCT: 25 %
MCH: 31.1 pg (ref 26.0–34.0)
MCHC: 33.8 g/dL (ref 30.0–36.0)
MCV: 92.1 fL (ref 78.0–100.0)
Monocytes Absolute: 0.9 10*3/uL (ref 0.1–1.0)
Monocytes Relative: 17 %
NEUTROS PCT: 55 %
Neutro Abs: 2.8 10*3/uL (ref 1.7–7.7)
PLATELETS: 170 10*3/uL (ref 150–400)
RBC: 4.53 MIL/uL (ref 4.22–5.81)
RDW: 13.5 % (ref 11.5–15.5)
WBC: 5.1 10*3/uL (ref 4.0–10.5)

## 2017-12-01 LAB — PROCALCITONIN

## 2017-12-01 NOTE — [place] FL2 (Addendum)
[place] MEDICAID FL2 LEVEL OF CARE SCREENING TOOL     IDENTIFICATION  Patient Name: Jordan Rowe Birthdate: 1946/02/15 Sex: male Admission Date (Current Location): 11/29/2017  [facility] and [place] Number:  Producer, television/film/video and Address:  [facility],  [address], [place] [address]      Provider Number: [id number]  Attending Physician Name and Address:  Randel Pigg, Dorma Russell, MD  Relative Name and Phone Number:       Current Level of Care: Hospital Recommended Level of Care: Assisted Living Facility Prior Approval Number:    Date Approved/Denied:   PASRR Number:    Discharge Plan: Other (Comment)(Assisted Living facility)    Current Diagnoses: Patient Active Problem List   Diagnosis Date Noted  . Chronic systolic CHF (congestive heart failure) (HCC) 11/30/2017  . Right lower lobe pneumonia (HCC) 11/30/2017  . Bradycardia 08/07/2017  . Chronic diastolic CHF (congestive heart failure) (HCC) 05/09/2017  . BPH (benign prostatic hyperplasia) 05/09/2017  . Subarachnoid bleed (HCC) 05/09/2017  . Traumatic closed fracture of C6 vertebra with minimal displacement, initial encounter (HCC) 05/08/2017  . Chronic anticoagulation 05/08/2017  . Hematoma of oral cavity 05/08/2017  . Fall   . Stroke-like symptom 01/13/2017  . NSVT (nonsustained ventricular tachycardia) (HCC) 10/05/2015  . Cardiomyopathy (HCC)   . Smoker 10/01/2015  . Schizophrenia (HCC)   . Diabetes mellitus type 2, controlled (HCC) 09/30/2015  . Atrial fibrillation (HCC) 09/30/2015  . Pain in the chest   . 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 Diabetes mellitus (HCC) 02/02/2013  . Dehydration 02/02/2013  . MENTAL RETARDATION 02/12/2009  . Essential hypertension 02/12/2009  . GERD 02/12/2009  . CONSTIPATION 02/12/2009    Orientation RESPIRATION BLADDER Height & Weight     Self,   Normal Incontinent Weight: 175 lb (79.4 kg) Height:  5\' 9"  (175.3 cm)  BEHAVIORAL SYMPTOMS/MOOD NEUROLOGICAL BOWEL  NUTRITION STATUS      Continent Diet(carb modified diet)  AMBULATORY STATUS COMMUNICATION OF NEEDS Skin   (uses walker/wheelchair) Verbally Normal                       Personal Care Assistance Level of Assistance  Bathing, Feeding, Dressing Bathing Assistance: Limited assistance Feeding assistance: Limited assistance Dressing Assistance: Limited assistance     Functional Limitations Info  Sight, Hearing, Speech Sight Info: Adequate Hearing Info: Impaired(hard of hearing) Speech Info: Adequate    SPECIAL CARE FACTORS FREQUENCY                       Contractures Contractures Info: Not present    Additional Factors Info  Code Status, Allergies Code Status Info: full code Allergies Info: nka           Current Medications (12/01/2017):  This is the current hospital active medication list Current Facility-Administered Medications  Medication Dose Route Frequency Provider Last Rate Last Dose  . acetaminophen (TYLENOL) tablet 325-650 mg  325-650 mg Oral Q6H PRN Hillary Bow, DO   650 mg at 12/01/17 0424  . atorvastatin (LIPITOR) tablet 10 mg  10 mg Oral q1800 Hillary Bow, DO   10 mg at 11/30/17 1829  . bisacodyl (DULCOLAX) suppository 10 mg  10 mg Rectal PRN Hillary Bow, DO      . ceFEPIme (MAXIPIME) 1 g in sodium chloride 0.9 % 100 mL IVPB  1 g Intravenous Q8H Lorenza Evangelist, [place]   Stopped at 12/01/17 7062  . cholecalciferol (VITAMIN D)  tablet 1,000 Units  1,000 Units Oral q morning - 10a Hillary Bow, DO   1,000 Units at 12/01/17 1139  . enoxaparin (LOVENOX) injection 40 mg  40 mg Subcutaneous Q24H Lyda Perone M, DO   40 mg at 12/01/17 1138  . feeding supplement (ENSURE ENLIVE) (ENSURE ENLIVE) liquid 237 mL  237 mL Oral BID BM Lyda Perone M, DO   237 mL at 12/01/17 1140  . finasteride (PROSCAR) tablet 5 mg  5 mg Oral QPM Lyda Perone M, DO   5 mg at 11/30/17 1829  . furosemide (LASIX) tablet 20 mg  20 mg Oral Daily Lyda Perone M, DO    20 mg at 12/01/17 1140  . hydrALAZINE (APRESOLINE) injection 5 mg  5 mg Intravenous Q6H PRN Randel Pigg, Dorma Russell, MD   5 mg at 12/01/17 0425  . insulin aspart (novoLOG) injection 0-9 Units  0-9 Units Subcutaneous TID WC Hillary Bow, DO   2 Units at 12/01/17 1153  . insulin glargine (LANTUS) injection 5 Units  5 Units Subcutaneous QHS Hillary Bow, DO   5 Units at 11/30/17 2150  . latanoprost (XALATAN) 0.005 % ophthalmic solution 1 drop  1 drop Both Eyes QHS Lyda Perone M, DO   1 drop at 11/30/17 2149  . losartan (COZAAR) tablet 50 mg  50 mg Oral Daily Randel Pigg, Dorma Russell, MD   50 mg at 12/01/17 1139  . olopatadine (PATANOL) 0.1 % ophthalmic solution 1 drop  1 drop Both Eyes Daily Lyda Perone M, DO   1 drop at 12/01/17 1140  . pantoprazole (PROTONIX) EC tablet 80 mg  80 mg Oral Daily Lyda Perone M, DO   80 mg at 12/01/17 1139  . risperiDONE (RISPERDAL) tablet 0.25 mg  0.25 mg Oral Daily Lyda Perone M, DO   0.25 mg at 12/01/17 1139  . risperiDONE (RISPERDAL) tablet 2 mg  2 mg Oral QHS Lyda Perone M, DO   2 mg at 11/30/17 2149  . senna (SENOKOT) tablet 8.6 mg  1 tablet Oral Daily Lyda Perone M, DO   8.6 mg at 12/01/17 1139  . tamsulosin (FLOMAX) capsule 0.4 mg  0.4 mg Oral Daily Lyda Perone M, DO   0.4 mg at 12/01/17 1140  . timolol (TIMOPTIC) 0.5 % ophthalmic solution 1 drop  1 drop Both Eyes Daily Lyda Perone M, DO   1 drop at 12/01/17 1140     Discharge Medications: Medication List    TAKE these medications   acetaminophen 325 MG tablet Commonly known as:  TYLENOL Take 325-650 mg by mouth every 6 (six) hours as needed for mild pain or moderate pain.   atorvastatin 10 MG tablet Commonly known as:  LIPITOR Take 10 mg by mouth daily.   bisacodyl 10 MG suppository Commonly known as:  DULCOLAX Place 10 mg rectally as needed for moderate constipation.   cefpodoxime 200 MG tablet Commonly known as:  VANTIN Take 1 tablet (200 mg total) by mouth 2 (two)  times daily for 5 days.   cholecalciferol 1000 units tablet Commonly known as:  VITAMIN D Take 1,000 Units by mouth every morning.   finasteride 5 MG tablet Commonly known as:  PROSCAR Take 5 mg by mouth every evening.   furosemide 20 MG tablet Commonly known as:  LASIX Take 20 mg by mouth daily.   insulin glargine 100 UNIT/ML injection Commonly known as:  LANTUS Inject 0.05 mLs (5 Units total) into the skin at bedtime. Hold is FSBS is  less than 75   latanoprost 0.005 % ophthalmic solution Commonly known as:  XALATAN Place 1 drop into both eyes at bedtime.   losartan 25 MG tablet Commonly known as:  COZAAR Take 1 tablet (25 mg total) by mouth daily.   olopatadine 0.1 % ophthalmic solution Commonly known as:  PATANOL Place 1 drop into both eyes every morning.   omeprazole 20 MG capsule Commonly known as:  PRILOSEC Take 40 mg by mouth every evening. 30 min prior to evening meal.   REFRESH OP Place 1 drop into both eyes 2 (two) times daily.   RISPERDAL 0.25 MG tablet Generic drug:  risperiDONE Take 0.25 mg by mouth every morning.   risperiDONE 2 MG tablet Commonly known as:  RISPERDAL Take 2 mg by mouth at bedtime.   senna 8.6 MG Tabs tablet Commonly known as:  SENOKOT Take 1 tablet by mouth daily.   tamsulosin 0.4 MG Caps capsule Commonly known as:  FLOMAX Take 1 capsule (0.4 mg total) by mouth daily.   timolol 0.5 % ophthalmic solution Commonly known as:  BETIMOL Place 1 drop into both eyes daily.       Relevant Imaging Results:  Relevant Lab Results:   Additional Information SS#: [id number].  Nelwyn Salisbury, LCSW

## 2017-12-01 NOTE — Progress Notes (Signed)
PROGRESS NOTE [facility]   Omarri Eich   ZOX:[id number] DOB: 17-Jul-1946  DOA: 11/29/2017 PCP: Patient, No Pcp Per   Brief Narrative:  Jordan Rowe is a 72 year old male with medical history significant for diabetes mellitus type 2, hypertension, A. fib, schizophrenia who presented to the emergency department after being sent from SNF for high fevers.  Upon ED evaluation patient reported to be asymptomatic denying cough, congestion and chest pain.  Vital signs shows fever with T-max of 101.2, elevated blood pressure up to 160/100.  Chest x-ray showed right lower lobe infiltrate concerning for pneumonia with elevated lactic acid.  Patient was admitted with working diagnosis of right lower lobe pneumonia and started on empiric antibiotics.  Subjective: Evette Cristal seen and examined, he has no complaints today.  Denies chest pain, cough and shortness of breath.  Patient was afebrile overnight.  Tolerating diet well.  Assessment & Plan: RLL Pneumonia (HCAP)  CXR shows RLL infiltrate, will treat as Health care associated as patient is a nursing home resident.  Continue Cefepime for now. Initially treated with Vancomycin, MRSA negative, so d/ced  Check procalcitonin, blood cultures no growth.  Strep antigen negative.  Continue to monitor.  Hypertension Resume home meds Continue to monitor  Chronic systolic CHF Continue current management  Diabetes mellitus type 2 CBGs stable Continue Lantus and SSI  PAF Heart rate well controlled, not on rate control medication, previously was on amiodarone which was discontinued due to bradycardia. Not on anticoagulation given previous subarachnoid hemorrhage from traumatic fall in 04/2017. Continue to monitor  DVT prophylaxis: Lovenox Code Status: Full code Family Communication: None at bedside Disposition Plan: SNF in the next 24-48 hours if remains afebrile  Consultants:   None  Procedures:   None  Antimicrobials: Anti-infectives  (From admission, onward)   Start     Dose/Rate Route Frequency Ordered Stop   11/30/17 2000  vancomycin (VANCOCIN) 1,500 mg in sodium chloride 0.9 % 500 mL IVPB  Status:  Discontinued     1,500 mg 250 mL/hr over 120 Minutes Intravenous Every 24 hours 11/30/17 0439 11/30/17 0941   11/30/17 0800  ceFEPIme (MAXIPIME) 1 g in sodium chloride 0.9 % 100 mL IVPB     1 g 200 mL/hr over 30 Minutes Intravenous Every 8 hours 11/30/17 0439     11/30/17 0100  vancomycin (VANCOCIN) 1,500 mg in sodium chloride 0.9 % 500 mL IVPB     1,500 mg 250 mL/hr over 120 Minutes Intravenous  Once 11/30/17 0059 11/30/17 0328   11/30/17 0045  ceFEPIme (MAXIPIME) 2 g in sodium chloride 0.9 % 100 mL IVPB     2 g 200 mL/hr over 30 Minutes Intravenous  Once 11/30/17 0041 11/30/17 0127   11/30/17 0045  vancomycin (VANCOCIN) IVPB 1000 mg/200 mL premix  Status:  Discontinued     1,000 mg 200 mL/hr over 60 Minutes Intravenous  Once 11/30/17 0041 11/30/17 0059        Objective: Vitals:   11/30/17 2143 11/30/17 2246 12/01/17 0412 12/01/17 0529  BP: (!) 181/102 (!) 167/104 (!) 178/96 (!) 156/100  Pulse: 83  79 79  Resp: 18  18   Temp: 100.3 F (37.9 C) 98.5 F (36.9 C) (!) 100.6 F (38.1 C) 99.1 F (37.3 C)  TempSrc: Oral  Oral Oral  SpO2: 99%  96%   Weight:      Height:        Intake/Output Summary (Last 24 hours) at 12/01/2017 1400 Last data filed at 12/01/2017 0600 Gross  per 24 hour  Intake 920 ml  Output 1750 ml  Net -830 ml   Filed Weights   11/30/17 0008  Weight: 79.4 kg (175 lb)    Examination:  General exam: NAD   HEENT: OP moist and clear Respiratory system: Good air entry, right lower lobe rales, no wheezing or crackles Cardiovascular system: S1 & S2 heard, RRR. No JVD, murmurs, rubs or gallops Gastrointestinal system: Abdomen is nondistended, soft and nontender.  Central nervous system: Alert and oriented. No focal neurological deficits. Extremities: No pedal edema. Skin: No rashes  Data  Reviewed: I have personally reviewed following labs and imaging studies  CBC: Recent Labs  Lab 11/29/17 2359  WBC 5.7  NEUTROABS 3.0  HGB 13.6  HCT 41.8  MCV 94.4  PLT 184   Basic Metabolic Panel: Recent Labs  Lab 11/29/17 2359  NA 142  K 3.0*  CL 101  CO2 29  GLUCOSE 220*  BUN 11  CREATININE 1.05  CALCIUM 8.5*   GFR: Estimated Creatinine Clearance: 66.4 mL/min (by C-G formula based on SCr of 1.05 mg/dL). Liver Function Tests: Recent Labs  Lab 11/29/17 2359  AST 13*  ALT 11*  ALKPHOS 64  BILITOT 0.6  PROT 6.2*  ALBUMIN 3.4*   No results for input(s): LIPASE, AMYLASE in the last 168 hours. No results for input(s): AMMONIA in the last 168 hours. Coagulation Profile: Recent Labs  Lab 11/29/17 2359  INR 1.06   Cardiac Enzymes: No results for input(s): CKTOTAL, CKMB, CKMBINDEX, TROPONINI in the last 168 hours. BNP (last 3 results) No results for input(s): PROBNP in the last 8760 hours. HbA1C: No results for input(s): HGBA1C in the last 72 hours. CBG: Recent Labs  Lab 11/30/17 1138 11/30/17 1633 11/30/17 2142 12/01/17 0804 12/01/17 1146  GLUCAP 160* 148* 128* 132* 182*   Lipid Profile: No results for input(s): CHOL, HDL, LDLCALC, TRIG, CHOLHDL, LDLDIRECT in the last 72 hours. Thyroid Function Tests: No results for input(s): TSH, T4TOTAL, FREET4, T3FREE, THYROIDAB in the last 72 hours. Anemia Panel: No results for input(s): VITAMINB12, FOLATE, FERRITIN, TIBC, IRON, RETICCTPCT in the last 72 hours. Sepsis Labs: Recent Labs  Lab 11/30/17 0010  LATICACIDVEN 2.95*    Recent Results (from the past 240 hour(s))  Culture, blood (Routine x 2)     Status: None (Preliminary result)   Collection Time: 11/29/17 11:50 PM  Result Value Ref Range Status   Specimen Description   Final    BLOOD RIGHT ARM Performed at [facility], [address], [place], [place] [address]    Special Requests   Final    BOTTLES DRAWN AEROBIC AND  ANAEROBIC Blood Culture results may not be optimal due to an excessive volume of blood received in culture bottles Performed at [facility], [address], [place], [place] [address]    Culture   Final    NO GROWTH 1 DAY Performed at [facility], [address] [place], [place] [address]    Report Status PENDING  Incomplete  Culture, blood (Routine x 2)     Status: None (Preliminary result)   Collection Time: 11/29/17 11:55 PM  Result Value Ref Range Status   Specimen Description   Final    BLOOD LEFT ARM Performed at [facility], [address] [place], [place] [address]    Special Requests   Final    BOTTLES DRAWN AEROBIC AND ANAEROBIC Blood Culture adequate volume Performed at [facility], [address], [place], [place] [address]    Culture   Final    NO GROWTH 1 DAY Performed at [facility], [address], [place], [place] [address]    Report Status PENDING  Incomplete  MRSA PCR Screening     Status: None   Collection Time: 11/30/17  2:51 AM  Result Value Ref Range Status   MRSA by PCR NEGATIVE NEGATIVE Final    Comment:        The GeneXpert MRSA Assay (FDA approved for NASAL specimens only), is one component of a comprehensive MRSA colonization surveillance program. It is not intended to diagnose MRSA infection nor to guide or monitor treatment for MRSA infections. Performed at [facility], [address] [place], [place] [address]       Radiology Studies: Dg Chest 2 View  Result Date: 11/30/2017 CLINICAL DATA:  Fever today. History of hypertension and diabetes. Smoker. EXAM: CHEST - 2 VIEW COMPARISON:  08/07/2017 FINDINGS: Shallow inspiration. Mild cardiac enlargement. Infiltration in the right lung base likely to represent pneumonia. No blunting of costophrenic angles. No pneumothorax. Mediastinal contours appear intact. Multiple old right rib fractures.  Degenerative changes in the spine and shoulders. Aortic calcification. IMPRESSION: 1. Infiltration in the right lower lung likely represents pneumonia. 2. Mild cardiac enlargement. 3. Aortic atherosclerosis. Electronically Signed   By: Burman Nieves M.D.   On: 11/30/2017 00:32      Scheduled Meds: . atorvastatin  10 mg Oral q1800  . cholecalciferol  1,000 Units Oral q morning - 10a  . enoxaparin (LOVENOX) injection  40 mg Subcutaneous Q24H  . feeding supplement (ENSURE ENLIVE)  237 mL Oral BID BM  . finasteride  5 mg Oral QPM  . furosemide  20 mg Oral Daily  . insulin aspart  0-9 Units Subcutaneous TID WC  . insulin glargine  5 Units Subcutaneous QHS  . latanoprost  1 drop Both Eyes QHS  . losartan  50 mg Oral Daily  . olopatadine  1 drop Both Eyes Daily  . pantoprazole  80 mg Oral Daily  . risperiDONE  0.25 mg Oral Daily  . risperiDONE  2 mg Oral QHS  . senna  1 tablet Oral Daily  . tamsulosin  0.4 mg Oral Daily  . timolol  1 drop Both Eyes Daily   Continuous Infusions: . ceFEPime (MAXIPIME) IV Stopped (12/01/17 0904)     LOS: 1 day    Time spent: Total of 25 minutes spent with pt, greater than 50% of which was spent in discussion of  treatment, counseling and coordination of care  Latrelle Dodrill, MD Pager: Text Page via [email or web address]   If 7PM-7AM, please contact night-coverage [email or web address] 12/01/2017, 2:00 PM   Note - This record has been created using [employer]. Chart creation errors have been sought, but may not always have been located. Such creation errors do not reflect on the standard of medical care.

## 2017-12-02 LAB — GLUCOSE, CAPILLARY
Glucose-Capillary: 116 mg/dL — ABNORMAL HIGH (ref 65–99)
Glucose-Capillary: 174 mg/dL — ABNORMAL HIGH (ref 65–99)

## 2017-12-02 MED ORDER — CEFPODOXIME PROXETIL 200 MG PO TABS: 200.0000 mg | ORAL_TABLET | Freq: Two times a day (BID) | ORAL | 0 refills | Status: AC

## 2017-12-02 NOTE — Clinical Social Work Note (Addendum)
Clinical Social Work Assessment  Patient Details  Name: Jordan Rowe MRN: [id number] Date of Birth: [birth year]  Date of referral:  12/02/17               Reason for consult:  Discharge Planning                Permission sought to share information with:  Family Supports Permission granted to share information::  Yes, Verbal Permission Granted  Name::      [name]   Agency::  [facility] Ass[facility] Living Facility   Relationship::   Brother   Contact Information:   [phone]  Housing/Transportation Living arrangements for the past 2 months:  Assisted Living Facility Source of Information:  Patient, Siblings Patient Interpreter Needed:  None Criminal Activity/Legal Involvement Pertinent to Current Situation/Hospitalization:  No - Comment as needed Significant Relationships:  Siblings Lives with:  Facility Resident Do you feel safe going back to the place where you live?  No Need for family participation in patient care:  Yes  Care giving concerns:   Patient admitted for high fevers. No concerns presented.   Social Worker assessment / plan:  Patient alert to self. CSW dicussed discharge planning with the patient Aurther Loft who is also his HCPOA. He reports the patient lives at [facility]. The facility helps the patient with bathing and dressing. The patient uses a wheelchair and can propel/ transfer self with support.   The patient will return.   Plan: Assisted Living Facility- [facility]   Employment status:  Disabled (Comment on whether or not currently receiving Disability) Insurance information:  Medicare PT Recommendations:  Not assessed at this time /The patient was at [facility] back in December 2018 for rehab therapy to regain his strength. Information / Referral to community resources:     Patient/Family's Response to care:  Agreeable and Responding well to care.   Patient/Family's Understanding of and Emotional Response to Diagnosis, Current  Treatment, and Prognosis: Patient brother has good understanding of patient diagnosis and care.   Emotional Assessment Appearance:  Appears stated age Attitude/Demeanor/Rapport:    Affect (typically observed):  Accepting Orientation:  Oriented to Self Alcohol / Substance use:  Not Applicable Psych involvement (Current and /or in the community):  No (Comment)  Discharge Needs  Concerns to be addressed:  Discharge Planning Concerns Readmission within the last 30 days:  No Current discharge risk:  Dependent with Mobility Barriers to Discharge:  No Barriers Identified   Clearance Coots, LCSW 12/02/2017, 9:30 AM

## 2017-12-02 NOTE — Discharge Summary (Signed)
Physician Discharge Summary  Jordan Rowe  UJW:[id number]  DOB: Dec 30, 1945  DOA: 11/29/2017 PCP: Patient, No Pcp Per  Admit date: 11/29/2017 Discharge date: 12/02/2017  Admitted From: ALF Disposition: ALF  Recommendations for Outpatient Follow-up:  1. Follow up with PCP in 1-2 weeks 2. Please obtain BMP/CBC in one week to monitor hemoglobin and renal function 3. Repeat chest x-ray in 6 weeks to ensure resolution of x-ray findings/pneumonia 4. Completed antibiotics for 5 more days  Discharge Condition: Stable  CODE STATUS: Full code Diet recommendation: Heart Healthy / Carb Modified  Brief/Interim Summary: For full details see H&P/Progress note, but in brief, Jordan Rowe is a  72 year old male with medical history significant for MR, diabetes mellitus type 2, hypertension, A. fib, schizophrenia who presented to the emergency department after being sent from SNF for high fevers.  Upon ED evaluation patient reported to be asymptomatic denying cough, congestion and chest pain.  Vital signs shows fever with T-max of 101.2, elevated blood pressure up to 160/100.  Chest x-ray showed right lower lobe infiltrate concerning for pneumonia with elevated lactic acid.  Patient was admitted with working diagnosis of right lower lobe pneumonia and started on empiric antibiotics.   Subjective: Patient seen and examined, he has no complaints this morning.  Remained afebrile overnight.  Denies chest pain, shortness of breath and cough.  Wants to go home.  Discharge Diagnoses/Hospital Course:  RLL Pneumonia (HCAP)  CXR shows RLL infiltrate, will treat as Health care associated as patient is a nursing home resident.  Initially treated with Vancomycin and cefepime, MRSA negative, therefore vancomycin discontinued, receive 48 hours of cefepime IV, transition to oral Vantin.  Pro-calcitonin negative, will treat for total of 7 days. Blood cultures no growth.  Strep antigen negative.    Follow-up x-ray in 6 weeks  recommended.  Hypertension Blood pressure acceptable Continue home medications with no changes  Chronic systolic CHF No signs of fluid overload Continue home medications with no changes  Diabetes mellitus type 2 CBGs stable during hospital stay Continue home medications with no changes  PAF Heart rate well controlled, not on rate control medication, previously was on amiodarone which was discontinued due to bradycardia. Not on anticoagulation given previous subarachnoid hemorrhage from traumatic fall in 04/2017.   Follow-up with PCP  All other chronic medical condition were stable during the hospitalization.  On the day of the discharge the patient's vitals were stable, and no other acute medical condition were reported by patient. the patient was felt safe to be discharge to ALF  Discharge Instructions  You were cared for by a hospitalist during your hospital stay. If you have any questions about your discharge medications or the care you received while you were in the hospital after you are discharged, you can call the unit and asked to speak with the hospitalist on call if the hospitalist that took care of you is not available. Once you are discharged, your primary care physician will handle any further medical issues. Please note that NO REFILLS for any discharge medications will be authorized once you are discharged, as it is imperative that you return to your primary care physician (or establish a relationship with a primary care physician if you do not have one) for your aftercare needs so that they can reassess your need for medications and monitor your lab values.  Discharge Instructions    Call MD for:  difficulty breathing, headache or visual disturbances   Complete by:  As directed    Call  MD for:  extreme fatigue   Complete by:  As directed    Call MD for:  hives   Complete by:  As directed    Call MD for:  persistant dizziness or light-headedness   Complete by:  As  directed    Call MD for:  persistant nausea and vomiting   Complete by:  As directed    Call MD for:  redness, tenderness, or signs of infection (pain, swelling, redness, odor or green/yellow discharge around incision site)   Complete by:  As directed    Call MD for:  severe uncontrolled pain   Complete by:  As directed    Call MD for:  temperature >100.4   Complete by:  As directed    Diet - low sodium heart healthy   Complete by:  As directed    Increase activity slowly   Complete by:  As directed      Allergies as of 12/02/2017   No Known Allergies     Medication List    TAKE these medications   acetaminophen 325 MG tablet Commonly known as:  TYLENOL Take 325-650 mg by mouth every 6 (six) hours as needed for mild pain or moderate pain.   atorvastatin 10 MG tablet Commonly known as:  LIPITOR Take 10 mg by mouth daily.   bisacodyl 10 MG suppository Commonly known as:  DULCOLAX Place 10 mg rectally as needed for moderate constipation.   cefpodoxime 200 MG tablet Commonly known as:  VANTIN Take 1 tablet (200 mg total) by mouth 2 (two) times daily for 5 days.   cholecalciferol 1000 units tablet Commonly known as:  VITAMIN D Take 1,000 Units by mouth every morning.   finasteride 5 MG tablet Commonly known as:  PROSCAR Take 5 mg by mouth every evening.   furosemide 20 MG tablet Commonly known as:  LASIX Take 20 mg by mouth daily.   insulin glargine 100 UNIT/ML injection Commonly known as:  LANTUS Inject 0.05 mLs (5 Units total) into the skin at bedtime. Hold is FSBS is less than 75   latanoprost 0.005 % ophthalmic solution Commonly known as:  XALATAN Place 1 drop into both eyes at bedtime.   losartan 25 MG tablet Commonly known as:  COZAAR Take 1 tablet (25 mg total) by mouth daily.   olopatadine 0.1 % ophthalmic solution Commonly known as:  PATANOL Place 1 drop into both eyes every morning.   omeprazole 20 MG capsule Commonly known as:  PRILOSEC Take 40  mg by mouth every evening. 30 min prior to evening meal.   REFRESH OP Place 1 drop into both eyes 2 (two) times daily.   RISPERDAL 0.25 MG tablet Generic drug:  risperiDONE Take 0.25 mg by mouth every morning.   risperiDONE 2 MG tablet Commonly known as:  RISPERDAL Take 2 mg by mouth at bedtime.   senna 8.6 MG Tabs tablet Commonly known as:  SENOKOT Take 1 tablet by mouth daily.   tamsulosin 0.4 MG Caps capsule Commonly known as:  FLOMAX Take 1 capsule (0.4 mg total) by mouth daily.   timolol 0.5 % ophthalmic solution Commonly known as:  BETIMOL Place 1 drop into both eyes daily.      Follow-up Information    Lennette Bihari, MD. Schedule an appointment as soon as possible for a visit in 1 week(s).   Specialty:  Cardiology Why:  Hospital follow-up Contact information: [address] [place] [address] [phone]  No Known Allergies  Consultations:  None    Procedures/Studies: Dg Chest 2 View  Result Date: 11/30/2017 CLINICAL DATA:  Fever today. History of hypertension and diabetes. Smoker. EXAM: CHEST - 2 VIEW COMPARISON:  08/07/2017 FINDINGS: Shallow inspiration. Mild cardiac enlargement. Infiltration in the right lung base likely to represent pneumonia. No blunting of costophrenic angles. No pneumothorax. Mediastinal contours appear intact. Multiple old right rib fractures. Degenerative changes in the spine and shoulders. Aortic calcification. IMPRESSION: 1. Infiltration in the right lower lung likely represents pneumonia. 2. Mild cardiac enlargement. 3. Aortic atherosclerosis. Electronically Signed   By: Burman Nieves M.D.   On: 11/30/2017 00:32    Discharge Exam: Vitals:   12/02/17 0544 12/02/17 0745  BP: (!) 165/99 (!) 142/95  Pulse: 92   Resp: 20   Temp: 100 F (37.8 C) 98.8 F (37.1 C)  SpO2: 96%    Vitals:   12/01/17 1725 12/01/17 2213 12/02/17 0544 12/02/17 0745  BP: (!) 172/104 (!) 154/94 (!) 165/99 (!) 142/95   Pulse: 73 84 92   Resp: (!) 24 20 20    Temp: 98.4 F (36.9 C) 99 F (37.2 C) 100 F (37.8 C) 98.8 F (37.1 C)  TempSrc: Oral Oral Oral Oral  SpO2: 96% 96% 96%   Weight:      Height:        General: NAD Cardiovascular: RRR, S1/S2 +, no rubs, no gallops Respiratory: Good air entry, mild rales at the RLL. No wheezing or crakles  Abdominal: Soft, NT, ND, bowel sounds + Extremities: no edema  The results of significant diagnostics from this hospitalization (including imaging, microbiology, ancillary and laboratory) are listed below for reference.     Microbiology: Recent Results (from the past 240 hour(s))  Culture, blood (Routine x 2)     Status: None (Preliminary result)   Collection Time: 11/29/17 11:50 PM  Result Value Ref Range Status   Specimen Description   Final    BLOOD RIGHT ARM Performed at [facility], [address], [place], [place] [address]    Special Requests   Final    BOTTLES DRAWN AEROBIC AND ANAEROBIC Blood Culture results may not be optimal due to an excessive volume of blood received in culture bottles Performed at [facility], [address] [place], [place] [address]    Culture   Final    NO GROWTH 2 DAYS Performed at [facility], [address], [place], [place] [address]    Report Status PENDING  Incomplete  Culture, blood (Routine x 2)     Status: None (Preliminary result)   Collection Time: 11/29/17 11:55 PM  Result Value Ref Range Status   Specimen Description   Final    BLOOD LEFT ARM Performed at [facility], [address] [place] [address], [place], [place] [address]    Special Requests   Final    BOTTLES DRAWN AEROBIC AND ANAEROBIC Blood Culture adequate volume Performed at [facility], [address] [place], [place] [address]    Culture   Final    NO GROWTH 2 DAYS Performed at [facility], [address] [place], [place] [address]    Report Status  PENDING  Incomplete  MRSA PCR Screening     Status: None   Collection Time: 11/30/17  2:51 AM  Result Value Ref Range Status   MRSA by PCR NEGATIVE NEGATIVE Final    Comment:        The  GeneXpert MRSA Assay (FDA approved for NASAL specimens only), is one component of a comprehensive MRSA colonization surveillance program. It is not intended to diagnose MRSA infection nor to guide or monitor treatment for MRSA infections. Performed at [facility], [address] [place], [place] [address]      Labs: BNP (last 3 results) No results for input(s): BNP in the last 8760 hours. Basic Metabolic Panel: Recent Labs  Lab 11/29/17 2359 12/01/17 1424  NA 142 138  K 3.0* 3.5  CL 101 105  CO2 29 25  GLUCOSE 220* 205*  BUN 11 15  CREATININE 1.05 1.02  CALCIUM 8.5* 8.8*   Liver Function Tests: Recent Labs  Lab 11/29/17 2359  AST 13*  ALT 11*  ALKPHOS 64  BILITOT 0.6  PROT 6.2*  ALBUMIN 3.4*   No results for input(s): LIPASE, AMYLASE in the last 168 hours. No results for input(s): AMMONIA in the last 168 hours. CBC: Recent Labs  Lab 11/29/17 2359 12/01/17 1424  WBC 5.7 5.1  NEUTROABS 3.0 2.8  HGB 13.6 14.1  HCT 41.8 41.7  MCV 94.4 92.1  PLT 184 170   Cardiac Enzymes: No results for input(s): CKTOTAL, CKMB, CKMBINDEX, TROPONINI in the last 168 hours. BNP: Invalid input(s): POCBNP CBG: Recent Labs  Lab 12/01/17 1146 12/01/17 1639 12/01/17 2212 12/02/17 0732 12/02/17 1140  GLUCAP 182* 204* 182* 116* 174*   D-Dimer No results for input(s): DDIMER in the last 72 hours. Hgb A1c No results for input(s): HGBA1C in the last 72 hours. Lipid Profile No results for input(s): CHOL, HDL, LDLCALC, TRIG, CHOLHDL, LDLDIRECT in the last 72 hours. Thyroid function studies No results for input(s): TSH, T4TOTAL, T3FREE, THYROIDAB in the last 72 hours.  Invalid input(s): FREET3 Anemia work up No results for input(s): VITAMINB12, FOLATE, FERRITIN,  TIBC, IRON, RETICCTPCT in the last 72 hours. Urinalysis    Component Value Date/Time   COLORURINE YELLOW 11/30/2017 0044   APPEARANCEUR CLEAR 11/30/2017 0044   LABSPEC 1.025 11/30/2017 0044   PHURINE 6.0 11/30/2017 0044   GLUCOSEU 150 (A) 11/30/2017 0044   HGBUR SMALL (A) 11/30/2017 0044   BILIRUBINUR NEGATIVE 11/30/2017 0044   KETONESUR 5 (A) 11/30/2017 0044   PROTEINUR NEGATIVE 11/30/2017 0044   UROBILINOGEN 1.0 02/01/2013 2300   NITRITE NEGATIVE 11/30/2017 0044   LEUKOCYTESUR NEGATIVE 11/30/2017 0044   Sepsis Labs Invalid input(s): PROCALCITONIN,  WBC,  LACTICIDVEN Microbiology Recent Results (from the past 240 hour(s))  Culture, blood (Routine x 2)     Status: None (Preliminary result)   Collection Time: 11/29/17 11:50 PM  Result Value Ref Range Status   Specimen Description   Final    BLOOD RIGHT ARM Performed at [facility], [address] [place], [place] [address]    Special Requests   Final    BOTTLES DRAWN AEROBIC AND ANAEROBIC Blood Culture results may not be optimal due to an excessive volume of blood received in culture bottles Performed at [facility], [address] [place], [place] [address]    Culture   Final    NO GROWTH 2 DAYS Performed at [facility], [address], [place], [place] [address]    Report Status PENDING  Incomplete  Culture, blood (Routine x 2)     Status: None (Preliminary result)   Collection Time: 11/29/17 11:55 PM  Result Value Ref Range Status   Specimen Description   Final    BLOOD LEFT ARM Performed at [facility], [address] [place], [place] [address]    Special Requests   Final    BOTTLES DRAWN AEROBIC AND ANAEROBIC Blood Culture adequate volume Performed at [facility], [address], [place], [place] [address]    Culture   Final    NO GROWTH 2 DAYS Performed at [facility], [address], [place], [place] [address]     Report Status PENDING  Incomplete  MRSA PCR Screening     Status: None   Collection Time: 11/30/17  2:51 AM  Result Value Ref Range Status   MRSA by PCR NEGATIVE NEGATIVE Final    Comment:        The GeneXpert MRSA Assay (FDA approved for NASAL specimens only), is one component of a comprehensive MRSA colonization surveillance program. It is not intended to diagnose MRSA infection nor to guide or monitor treatment for MRSA infections. Performed at [facility], [address], [place], [place] [address]      Time coordinating discharge: 32 minutes  SIGNED:  Latrelle Dodrill, MD  [facility] 12/02/2017, 12:36 PM  Pager please text page via  [email or web address]  Note - This record has been created using [employer]. Chart creation errors have been sought, but may not always have been located. Such creation errors do not reflect on the standard of medical care.

## 2017-12-02 NOTE — Progress Notes (Signed)
D/C Summary sent/FL2 sent and reviewed by Laytoya at facility. Nurse given number to call report.  [facility] called for transport.  Patient brother informed of transport.  No other needs identified at this time.   Vivi Barrack, Theresia Majors, MSW Clinical Social Worker  [phone] 12/02/2017  2:30 PM

## 2017-12-02 NOTE — Care Management Note (Signed)
Case Management Note  Patient Details  Name: Jordan Rowe MRN: [id number] Date of Birth: 12-09-1945  Subjective/Objective:                    Action/Plan:d/c SNF.   Expected Discharge Date:                  Expected Discharge Plan:  Skilled Nursing Facility  In-House Referral:  Clinical Social Work  Discharge planning Services  CM Consult  Post Acute Care Choice:    Choice offered to:     DME Arranged:    DME Agency:     HH Arranged:    HH Agency:     Status of Service:  Completed, signed off  If discussed at [employer], dates discussed:    Additional Comments:  Lanier Clam, RN 12/02/2017, 12:31 PM

## 2017-12-05 LAB — CULTURE, BLOOD (ROUTINE X 2)
CULTURE: NO GROWTH
Culture: NO GROWTH
Special Requests: ADEQUATE

## 2017-12-08 ENCOUNTER — Encounter (HOSPITAL_COMMUNITY): Payer: Self-pay | Admitting: Nurse Practitioner

## 2017-12-08 ENCOUNTER — Ambulatory Visit (HOSPITAL_COMMUNITY)
Admission: RE | Admit: 2017-12-08 | Discharge: 2017-12-08 | Disposition: A | Discharge status: Home / Self Care | Payer: Medicare Other | Source: Ambulatory Visit | Attending: Nurse Practitioner | Admitting: Nurse Practitioner

## 2017-12-08 VITALS — BP 90/62 | HR 110 | Ht 69.0 in | Wt 177.0 lb

## 2017-12-08 DIAGNOSIS — F17200 Nicotine dependence, unspecified, uncomplicated: Secondary | ICD-10-CM | POA: Diagnosis not present

## 2017-12-08 DIAGNOSIS — N400 Enlarged prostate without lower urinary tract symptoms: Secondary | ICD-10-CM | POA: Diagnosis not present

## 2017-12-08 DIAGNOSIS — R001 Bradycardia, unspecified: Secondary | ICD-10-CM | POA: Diagnosis not present

## 2017-12-08 DIAGNOSIS — Z794 Long term (current) use of insulin: Secondary | ICD-10-CM | POA: Diagnosis not present

## 2017-12-08 DIAGNOSIS — I10 Essential (primary) hypertension: Secondary | ICD-10-CM | POA: Diagnosis not present

## 2017-12-08 DIAGNOSIS — Z79899 Other long term (current) drug therapy: Secondary | ICD-10-CM | POA: Insufficient documentation

## 2017-12-08 DIAGNOSIS — F79 Unspecified intellectual disabilities: Secondary | ICD-10-CM | POA: Insufficient documentation

## 2017-12-08 DIAGNOSIS — F209 Schizophrenia, unspecified: Secondary | ICD-10-CM | POA: Insufficient documentation

## 2017-12-08 DIAGNOSIS — E559 Vitamin D deficiency, unspecified: Secondary | ICD-10-CM | POA: Insufficient documentation

## 2017-12-08 DIAGNOSIS — Z8673 Personal history of transient ischemic attack (TIA), and cerebral infarction without residual deficits: Secondary | ICD-10-CM | POA: Diagnosis not present

## 2017-12-08 DIAGNOSIS — I480 Paroxysmal atrial fibrillation: Secondary | ICD-10-CM | POA: Diagnosis present

## 2017-12-08 DIAGNOSIS — K219 Gastro-esophageal reflux disease without esophagitis: Secondary | ICD-10-CM | POA: Diagnosis not present

## 2017-12-08 DIAGNOSIS — I4510 Unspecified right bundle-branch block: Secondary | ICD-10-CM | POA: Insufficient documentation

## 2017-12-08 DIAGNOSIS — E119 Type 2 diabetes mellitus without complications: Secondary | ICD-10-CM | POA: Insufficient documentation

## 2017-12-08 DIAGNOSIS — H409 Unspecified glaucoma: Secondary | ICD-10-CM | POA: Diagnosis not present

## 2017-12-08 LAB — CBC
HEMATOCRIT: 46.6 % (ref 39.0–52.0)
Hemoglobin: 15.9 g/dL (ref 13.0–17.0)
MCH: 31.6 pg (ref 26.0–34.0)
MCHC: 34.1 g/dL (ref 30.0–36.0)
MCV: 92.6 fL (ref 78.0–100.0)
PLATELETS: 228 10*3/uL (ref 150–400)
RBC: 5.03 MIL/uL (ref 4.22–5.81)
RDW: 14 % (ref 11.5–15.5)
WBC: 6.4 10*3/uL (ref 4.0–10.5)

## 2017-12-08 LAB — BASIC METABOLIC PANEL
ANION GAP: 11 (ref 5–15)
BUN: 16 mg/dL (ref 6–20)
CALCIUM: 8.8 mg/dL — AB (ref 8.9–10.3)
CO2: 22 mmol/L (ref 22–32)
Chloride: 108 mmol/L (ref 101–111)
Creatinine, Ser: 1.34 mg/dL — ABNORMAL HIGH (ref 0.61–1.24)
GFR calc Af Amer: >60 mL/min (ref >60)
GFR, EST NON AFRICAN AMERICAN: 52 mL/min — AB (ref >60)
GLUCOSE: 192 mg/dL — AB (ref 65–99)
Potassium: 3.6 mmol/L (ref 3.5–5.1)
Sodium: 141 mmol/L (ref 135–145)

## 2017-12-08 NOTE — Progress Notes (Signed)
Primary Care Physician: Patient, No Pcp Per Referring Physician: f/u [facility]   Jordan Rowe is a 72 y.o. male with a h/o  MR, diabetes mellitus type 2, hypertension, A. fib, schizophrenia who presented to the emergency department after being sent from SNF for high fevers. Upon ED evaluation patient reported to be asymptomatic denying cough, congestion and chest pain. Vital signs shows fever with T-max of 101.2, elevated blood pressure up to 160/100. Chest x-ray showed right lower lobe infiltrate concerning for pneumonia with elevated lactic acid. Patient was admitted with working diagnosis of right lower lobe pneumonia and started on empiric antibiotics.  He was hospitalized in December for bradycardia and amiodarone and rate control was stopped. Prior to that in September 2018, he had a fall with a subarachnoid hemorrhage and C6 fracture. Eliquis was stopped.  He is here for f/u recent hospitalization for pneumonia.  He is still coughing but no further fever/chills.  He is in afib with v rate of 110 bpm. He is not aware. A representative from his facility is with him today.   Today, he denies symptoms of palpitations, chest pain, shortness of breath, orthopnea, PND, lower extremity edema, dizziness, presyncope, syncope, or neurologic sequela. The patient is tolerating medications without difficulties and is otherwise without complaint today.   Past Medical History:  Diagnosis Date  . Atrial fibrillation with RVR (HCC)    [name] 09/30/2015  . BPH (benign prostatic hypertrophy)    [name] 09/30/2015  . Constipation   . Edema   . Fracture of C6 vertebra, closed (HCC) 04/2017  . GERD (gastroesophageal reflux disease)   . Glaucoma   . Hypertension   . ICB (intracranial bleed) (HCC) 04/2017   with fall  . Mental retardation    [name] 09/30/2015  . Schizophrenia (HCC)   . Stroke (HCC)   . Type II diabetes mellitus (HCC)    [name] 09/30/2015  . Vitamin D deficiency    Past Surgical History:   Procedure Laterality Date  . CATARACT EXTRACTION Left 12/2002   [name] 01/12/2011  . CATARACT EXTRACTION W/ INTRAOCULAR LENS IMPLANT Right 01/2011   [name] 02/18/2011  . MULTIPLE TOOTH EXTRACTIONS      Current Outpatient Medications  Medication Sig Dispense Refill  . acetaminophen (TYLENOL) 325 MG tablet Take 325-650 mg by mouth every 6 (six) hours as needed for mild pain or moderate pain.     Marland Kitchen atorvastatin (LIPITOR) 10 MG tablet Take 10 mg by mouth daily.    . bisacodyl (DULCOLAX) 10 MG suppository Place 10 mg rectally as needed for moderate constipation.    . cholecalciferol (VITAMIN D) 1000 UNITS tablet Take 1,000 Units by mouth every morning.     . finasteride (PROSCAR) 5 MG tablet Take 5 mg by mouth every evening.     . furosemide (LASIX) 20 MG tablet Take 20 mg by mouth daily.    . insulin glargine (LANTUS) 100 UNIT/ML injection Inject 0.05 mLs (5 Units total) into the skin at bedtime. Hold is FSBS is less than 75 10 mL 11  . latanoprost (XALATAN) 0.005 % ophthalmic solution Place 1 drop into both eyes at bedtime.    Marland Kitchen losartan (COZAAR) 25 MG tablet Take 1 tablet (25 mg total) by mouth daily. 30 tablet 0  . olopatadine (PATANOL) 0.1 % ophthalmic solution Place 1 drop into both eyes every morning.    Marland Kitchen omeprazole (PRILOSEC) 20 MG capsule Take 40 mg by mouth every evening. 30 min prior to evening meal.    .  Polyvinyl Alcohol-Povidone (REFRESH OP) Place 1 drop into both eyes 2 (two) times daily.    . risperiDONE (RISPERDAL) 0.25 MG tablet Take 0.25 mg by mouth every morning.     . risperiDONE (RISPERDAL) 2 MG tablet Take 2 mg by mouth at bedtime.    . senna (SENOKOT) 8.6 MG TABS Take 1 tablet by mouth daily.     . tamsulosin (FLOMAX) 0.4 MG CAPS capsule Take 1 capsule (0.4 mg total) by mouth daily. 30 capsule 0  . timolol (BETIMOL) 0.5 % ophthalmic solution Place 1 drop into both eyes daily.     No current facility-administered medications for this encounter.     No Known  Allergies  Social History   Socioeconomic History  . Marital status: Divorced    Spouse name: Not on file  . Number of children: Not on file  . Years of education: Not on file  . Highest education level: Not on file  Occupational History  . Not on file  Social Needs  . Financial resource strain: Not on file  . Food insecurity:    Worry: Not on file    Inability: Not on file  . Transportation needs:    Medical: Not on file    Non-medical: Not on file  Tobacco Use  . Smoking status: Current Every Day Smoker  . Smokeless tobacco: Never Used  Substance and Sexual Activity  . Alcohol use: No  . Drug use: No  . Sexual activity: Not on file  Lifestyle  . Physical activity:    Days per week: Not on file    Minutes per session: Not on file  . Stress: Not on file  Relationships  . Social connections:    Talks on phone: Not on file    Gets together: Not on file    Attends religious service: Not on file    Active member of club or organization: Not on file    Attends meetings of clubs or organizations: Not on file    Relationship status: Not on file  . Intimate partner violence:    Fear of current or ex partner: Not on file    Emotionally abused: Not on file    Physically abused: Not on file    Forced sexual activity: Not on file  Other Topics Concern  . Not on file  Social History Narrative  . Not on file    Family History  Problem Relation Age of Onset  . Hypertension Mother     ROS- All systems are reviewed and negative except as per the HPI above  Physical Exam: Vitals:   12/08/17 1144  BP: 90/62  Pulse: (!) 110  Weight: 177 lb (80.3 kg)  Height: 5\' 9"  (1.753 m)   Wt Readings from Last 3 Encounters:  12/08/17 177 lb (80.3 kg)  11/30/17 175 lb (79.4 kg)  09/07/17 188 lb (85.3 kg)    Labs: Lab Results  Component Value Date   NA 141 12/08/2017   K 3.6 12/08/2017   CL 108 12/08/2017   CO2 22 12/08/2017   GLUCOSE 192 (H) 12/08/2017   BUN 16  12/08/2017   CREATININE 1.34 (H) 12/08/2017   CALCIUM 8.8 (L) 12/08/2017   PHOS 3.3 08/08/2017   MG 1.9 08/08/2017   Lab Results  Component Value Date   INR 1.06 11/29/2017   Lab Results  Component Value Date   CHOL 133 01/14/2017   HDL 49 01/14/2017   LDLCALC 68 01/14/2017   TRIG 78  01/14/2017     GEN- The patient is well appearing, alert and oriented x 3 today.   Head- normocephalic, atraumatic Eyes-  Sclera clear, conjunctiva pink Ears- hearing intact Oropharynx- clear Neck- supple, no JVP Lymph- no cervical lymphadenopathy Lungs- Clear to ausculation bilaterally, normal work of breathing Heart- irregular rate and rhythm, no murmurs, rubs or gallops, PMI not laterally displaced GI- soft, NT, ND, + BS Extremities- no clubbing, cyanosis, or edema MS- no significant deformity or atrophy Skin- no rash or lesion Psych- euthymic mood, full affect Neuro- strength and sensation are intact  EKG- afib at 110 bpm, qrs int 120 ms, qtc 503 ms Epic records reviewed   Assessment and Plan: 1. Paroxysmal afib Pt had been in SR for years on amiodarone but this was stopped in December 2/2 bradycardia Now back in afib possibly form stress of Pneumonia He is off all rate control and has BP of 90/60 today I will bring back in 2 weeks when pneumonia is resolved and reevaluate rhythm /heart rate He is not on anticoagulation 2/2 fall in September 2018 with subarachnoid hemorrhage and is high risk to resume anticoagulation  His hospitlaization 07/2017 was 2/2 to weakness and fall as well Bmet/ cbc  Return in 2 weeks  Lupita Leash C. [facility] [address] [place], [place] [address] [phone]

## 2017-12-21 ENCOUNTER — Ambulatory Visit (HOSPITAL_COMMUNITY): Payer: Medicare Other | Admitting: Nurse Practitioner

## 2017-12-22 ENCOUNTER — Encounter (HOSPITAL_COMMUNITY): Payer: Self-pay | Admitting: Nurse Practitioner

## 2017-12-22 ENCOUNTER — Ambulatory Visit (HOSPITAL_COMMUNITY)
Admission: RE | Admit: 2017-12-22 | Discharge: 2017-12-22 | Disposition: A | Discharge status: Home / Self Care | Payer: Medicare Other | Source: Ambulatory Visit | Attending: Nurse Practitioner | Admitting: Nurse Practitioner

## 2017-12-22 VITALS — BP 152/94 | HR 72 | Ht 69.0 in | Wt 180.0 lb

## 2017-12-22 DIAGNOSIS — R001 Bradycardia, unspecified: Secondary | ICD-10-CM | POA: Diagnosis not present

## 2017-12-22 DIAGNOSIS — Z794 Long term (current) use of insulin: Secondary | ICD-10-CM | POA: Insufficient documentation

## 2017-12-22 DIAGNOSIS — F209 Schizophrenia, unspecified: Secondary | ICD-10-CM | POA: Insufficient documentation

## 2017-12-22 DIAGNOSIS — Z8673 Personal history of transient ischemic attack (TIA), and cerebral infarction without residual deficits: Secondary | ICD-10-CM | POA: Diagnosis not present

## 2017-12-22 DIAGNOSIS — Z79899 Other long term (current) drug therapy: Secondary | ICD-10-CM | POA: Insufficient documentation

## 2017-12-22 DIAGNOSIS — F17200 Nicotine dependence, unspecified, uncomplicated: Secondary | ICD-10-CM | POA: Diagnosis not present

## 2017-12-22 DIAGNOSIS — E119 Type 2 diabetes mellitus without complications: Secondary | ICD-10-CM | POA: Insufficient documentation

## 2017-12-22 DIAGNOSIS — Z9889 Other specified postprocedural states: Secondary | ICD-10-CM | POA: Insufficient documentation

## 2017-12-22 DIAGNOSIS — H409 Unspecified glaucoma: Secondary | ICD-10-CM | POA: Insufficient documentation

## 2017-12-22 DIAGNOSIS — K219 Gastro-esophageal reflux disease without esophagitis: Secondary | ICD-10-CM | POA: Diagnosis not present

## 2017-12-22 DIAGNOSIS — Z8249 Family history of ischemic heart disease and other diseases of the circulatory system: Secondary | ICD-10-CM | POA: Insufficient documentation

## 2017-12-22 DIAGNOSIS — F79 Unspecified intellectual disabilities: Secondary | ICD-10-CM | POA: Insufficient documentation

## 2017-12-22 DIAGNOSIS — N400 Enlarged prostate without lower urinary tract symptoms: Secondary | ICD-10-CM | POA: Diagnosis not present

## 2017-12-22 DIAGNOSIS — I10 Essential (primary) hypertension: Secondary | ICD-10-CM | POA: Diagnosis not present

## 2017-12-22 DIAGNOSIS — I480 Paroxysmal atrial fibrillation: Secondary | ICD-10-CM | POA: Diagnosis present

## 2017-12-22 NOTE — Progress Notes (Signed)
Primary Care Physician: Patient, No Pcp Per Referring Physician: f/u [facility]   Jordan Rowe is a 72 y.o. male with a h/o  MR, diabetes mellitus type 2, hypertension, A. fib, schizophrenia who presented to the emergency department after being sent from SNF for high fevers. Upon ED evaluation patient reported to be asymptomatic denying cough, congestion and chest pain. Vital signs shows fever with T-max of 101.2, elevated blood pressure up to 160/100. Chest x-ray showed right lower lobe infiltrate concerning for pneumonia with elevated lactic acid. Patient was admitted with working diagnosis of right lower lobe pneumonia and started on empiric antibiotics.  He was hospitalized in December for bradycardia and amiodarone and rate control was stopped. Prior to that in September 2018, he had a fall with a subarachnoid hemorrhage and C6 fracture. Eliquis was stopped.  He is here for f/u recent hospitalization for pneumonia.  He is still coughing but no further fever/chills.  He is in afib with v rate of 110 bpm. He is not aware. A representative from his facility is with him today.   F/u in afib clinic 4/25. He has recovered from Pneumonia. Ekg today shows SR. He has no complaints.   Today, he denies symptoms of palpitations, chest pain, shortness of breath, orthopnea, PND, lower extremity edema, dizziness, presyncope, syncope, or neurologic sequela. The patient is tolerating medications without difficulties and is otherwise without complaint today.   Past Medical History:  Diagnosis Date  . Atrial fibrillation with RVR (HCC)    [name] 09/30/2015  . BPH (benign prostatic hypertrophy)    [name] 09/30/2015  . Constipation   . Edema   . Fracture of C6 vertebra, closed (HCC) 04/2017  . GERD (gastroesophageal reflux disease)   . Glaucoma   . Hypertension   . ICB (intracranial bleed) (HCC) 04/2017   with fall  . Mental retardation    [name] 09/30/2015  . Schizophrenia (HCC)   . Stroke (HCC)   .  Type II diabetes mellitus (HCC)    [name] 09/30/2015  . Vitamin D deficiency    Past Surgical History:  Procedure Laterality Date  . CATARACT EXTRACTION Left 12/2002   [name] 01/12/2011  . CATARACT EXTRACTION W/ INTRAOCULAR LENS IMPLANT Right 01/2011   [name] 02/18/2011  . MULTIPLE TOOTH EXTRACTIONS      Current Outpatient Medications  Medication Sig Dispense Refill  . acetaminophen (TYLENOL) 325 MG tablet Take 325-650 mg by mouth every 6 (six) hours as needed for mild pain or moderate pain.     . bisacodyl (DULCOLAX) 10 MG suppository Place 10 mg rectally as needed for moderate constipation.    . cholecalciferol (VITAMIN D) 1000 UNITS tablet Take 1,000 Units by mouth every morning.     . finasteride (PROSCAR) 5 MG tablet Take 5 mg by mouth every evening.     . furosemide (LASIX) 20 MG tablet Take 20 mg by mouth daily.    . insulin glargine (LANTUS) 100 UNIT/ML injection Inject 0.05 mLs (5 Units total) into the skin at bedtime. Hold is FSBS is less than 75 10 mL 11  . latanoprost (XALATAN) 0.005 % ophthalmic solution Place 1 drop into both eyes at bedtime.    Marland Kitchen losartan (COZAAR) 25 MG tablet Take 1 tablet (25 mg total) by mouth daily. 30 tablet 0  . olopatadine (PATANOL) 0.1 % ophthalmic solution Place 1 drop into both eyes every morning.    Marland Kitchen omeprazole (PRILOSEC) 20 MG capsule Take 40 mg by mouth every evening. 30 min prior  to evening meal.    . Polyvinyl Alcohol-Povidone (REFRESH OP) Place 1 drop into both eyes 2 (two) times daily.    . risperiDONE (RISPERDAL) 0.25 MG tablet Take 0.25 mg by mouth every morning.     . risperiDONE (RISPERDAL) 2 MG tablet Take 2 mg by mouth at bedtime.    . senna (SENOKOT) 8.6 MG TABS Take 1 tablet by mouth daily.     Marland Kitchen spironolactone (ALDACTONE) 25 MG tablet Take 25 mg by mouth daily.    . tamsulosin (FLOMAX) 0.4 MG CAPS capsule Take 1 capsule (0.4 mg total) by mouth daily. 30 capsule 0  . timolol (BETIMOL) 0.5 % ophthalmic solution Place 1 drop into both  eyes daily.    Marland Kitchen atorvastatin (LIPITOR) 10 MG tablet Take 10 mg by mouth daily.     No current facility-administered medications for this encounter.     No Known Allergies  Social History   Socioeconomic History  . Marital status: Divorced    Spouse name: Not on file  . Number of children: Not on file  . Years of education: Not on file  . Highest education level: Not on file  Occupational History  . Not on file  Social Needs  . Financial resource strain: Not on file  . Food insecurity:    Worry: Not on file    Inability: Not on file  . Transportation needs:    Medical: Not on file    Non-medical: Not on file  Tobacco Use  . Smoking status: Current Every Day Smoker  . Smokeless tobacco: Never Used  Substance and Sexual Activity  . Alcohol use: No  . Drug use: No  . Sexual activity: Not on file  Lifestyle  . Physical activity:    Days per week: Not on file    Minutes per session: Not on file  . Stress: Not on file  Relationships  . Social connections:    Talks on phone: Not on file    Gets together: Not on file    Attends religious service: Not on file    Active member of club or organization: Not on file    Attends meetings of clubs or organizations: Not on file    Relationship status: Not on file  . Intimate partner violence:    Fear of current or ex partner: Not on file    Emotionally abused: Not on file    Physically abused: Not on file    Forced sexual activity: Not on file  Other Topics Concern  . Not on file  Social History Narrative  . Not on file    Family History  Problem Relation Age of Onset  . Hypertension Mother     ROS- All systems are reviewed and negative except as per the HPI above  Physical Exam: Vitals:   12/22/17 1109  BP: (!) 152/94  Pulse: 72  Weight: 180 lb (81.6 kg)  Height: 5\' 9"  (1.753 m)   Wt Readings from Last 3 Encounters:  12/22/17 180 lb (81.6 kg)  12/08/17 177 lb (80.3 kg)  11/30/17 175 lb (79.4 kg)     Labs: Lab Results  Component Value Date   NA 141 12/08/2017   K 3.6 12/08/2017   CL 108 12/08/2017   CO2 22 12/08/2017   GLUCOSE 192 (H) 12/08/2017   BUN 16 12/08/2017   CREATININE 1.34 (H) 12/08/2017   CALCIUM 8.8 (L) 12/08/2017   PHOS 3.3 08/08/2017   MG 1.9 08/08/2017   Lab  Results  Component Value Date   INR 1.06 11/29/2017   Lab Results  Component Value Date   CHOL 133 01/14/2017   HDL 49 01/14/2017   LDLCALC 68 01/14/2017   TRIG 78 01/14/2017     GEN- The patient is chronically ill appearing, alert but not communicative today, with assistance form nursing facility Head- normocephalic, atraumatic Eyes-  Sclera clear, conjunctiva pink Ears- hearing intact Oropharynx- clear Neck- supple, no JVP Lymph- no cervical lymphadenopathy Lungs- Clear to ausculation bilaterally, normal work of breathing Heart- regular rate and rhythm, no murmurs, rubs or gallops, PMI not laterally displaced GI- soft, NT, ND, + BS Extremities- no clubbing, cyanosis, or edema MS- no significant deformity or atrophy Skin- no rash or lesion Psych- euthymic mood, full affect Neuro- strength and sensation are intact  EKG- SR at 72 bpm, with PAC's, pr int 158 ms, qrs int 118 bpm, qtc 516 ms Epic records reviewed   Assessment and Plan: 1. Paroxysmal afib Pt had been in SR for years on amiodarone but this was stopped in December 2/2 bradycardia On last visit,  back in afib possibly from stress of Pneumonia Now back in SR He is off all rate control 2/2 brady He is not on anticoagulation 2/2 fall in September 2018 with subarachnoid hemorrhage and is high risk to resume anticoagulation  His hospitlaization 07/2017 was 2/2 to weakness and fall as well   Return in 6 months  Lupita Leash C. [facility] [address] [place], [place] [address] [phone]

## 2018-06-21 ENCOUNTER — Ambulatory Visit (HOSPITAL_COMMUNITY): Payer: Medicare Other | Admitting: Nurse Practitioner

## 2018-06-30 ENCOUNTER — Ambulatory Visit (HOSPITAL_COMMUNITY)
Admission: RE | Admit: 2018-06-30 | Discharge: 2018-06-30 | Disposition: A | Discharge status: Home / Self Care | Payer: Medicare Other | Source: Ambulatory Visit | Attending: Nurse Practitioner | Admitting: Nurse Practitioner

## 2018-06-30 ENCOUNTER — Encounter (HOSPITAL_COMMUNITY): Payer: Self-pay | Admitting: Nurse Practitioner

## 2018-06-30 VITALS — BP 118/74 | HR 134 | Ht 69.0 in | Wt 202.0 lb

## 2018-06-30 DIAGNOSIS — I4510 Unspecified right bundle-branch block: Secondary | ICD-10-CM | POA: Diagnosis not present

## 2018-06-30 DIAGNOSIS — Z79899 Other long term (current) drug therapy: Secondary | ICD-10-CM | POA: Diagnosis not present

## 2018-06-30 DIAGNOSIS — K219 Gastro-esophageal reflux disease without esophagitis: Secondary | ICD-10-CM | POA: Insufficient documentation

## 2018-06-30 DIAGNOSIS — E119 Type 2 diabetes mellitus without complications: Secondary | ICD-10-CM | POA: Insufficient documentation

## 2018-06-30 DIAGNOSIS — I10 Essential (primary) hypertension: Secondary | ICD-10-CM | POA: Insufficient documentation

## 2018-06-30 DIAGNOSIS — N400 Enlarged prostate without lower urinary tract symptoms: Secondary | ICD-10-CM | POA: Insufficient documentation

## 2018-06-30 DIAGNOSIS — F209 Schizophrenia, unspecified: Secondary | ICD-10-CM | POA: Insufficient documentation

## 2018-06-30 DIAGNOSIS — F17210 Nicotine dependence, cigarettes, uncomplicated: Secondary | ICD-10-CM | POA: Insufficient documentation

## 2018-06-30 DIAGNOSIS — F79 Unspecified intellectual disabilities: Secondary | ICD-10-CM | POA: Insufficient documentation

## 2018-06-30 DIAGNOSIS — Z794 Long term (current) use of insulin: Secondary | ICD-10-CM | POA: Insufficient documentation

## 2018-06-30 DIAGNOSIS — I480 Paroxysmal atrial fibrillation: Secondary | ICD-10-CM

## 2018-06-30 LAB — CBC WITH DIFFERENTIAL/PLATELET
Abs Immature Granulocytes: 0.01 10*3/uL (ref 0.00–0.07)
BASOS ABS: 0 10*3/uL (ref 0.0–0.1)
Basophils Relative: 1 %
EOS ABS: 0 10*3/uL (ref 0.0–0.5)
Eosinophils Relative: 1 %
HEMATOCRIT: 44.4 % (ref 39.0–52.0)
Hemoglobin: 14.3 g/dL (ref 13.0–17.0)
IMMATURE GRANULOCYTES: 0 %
LYMPHS ABS: 1.5 10*3/uL (ref 0.7–4.0)
Lymphocytes Relative: 36 %
MCH: 29.4 pg (ref 26.0–34.0)
MCHC: 32.2 g/dL (ref 30.0–36.0)
MCV: 91.4 fL (ref 80.0–100.0)
Monocytes Absolute: 0.4 10*3/uL (ref 0.1–1.0)
Monocytes Relative: 10 %
NEUTROS PCT: 52 %
NRBC: 0 % (ref 0.0–0.2)
Neutro Abs: 2.2 10*3/uL (ref 1.7–7.7)
PLATELETS: 170 10*3/uL (ref 150–400)
RBC: 4.86 MIL/uL (ref 4.22–5.81)
RDW: 15.3 % (ref 11.5–15.5)
WBC: 4.3 10*3/uL (ref 4.0–10.5)

## 2018-06-30 LAB — COMPREHENSIVE METABOLIC PANEL
ALBUMIN: 3.2 g/dL — AB (ref 3.5–5.0)
ALT: 26 U/L (ref 0–44)
ANION GAP: 8 (ref 5–15)
AST: 15 U/L (ref 15–41)
Alkaline Phosphatase: 63 U/L (ref 38–126)
BILIRUBIN TOTAL: 0.9 mg/dL (ref 0.3–1.2)
BUN: 12 mg/dL (ref 8–23)
CO2: 27 mmol/L (ref 22–32)
Calcium: 8.7 mg/dL — ABNORMAL LOW (ref 8.9–10.3)
Chloride: 106 mmol/L (ref 98–111)
Creatinine, Ser: 1.28 mg/dL — ABNORMAL HIGH (ref 0.61–1.24)
GFR calc Af Amer: >60 mL/min (ref >60)
GFR calc non Af Amer: 55 mL/min — ABNORMAL LOW (ref >60)
GLUCOSE: 131 mg/dL — AB (ref 70–99)
POTASSIUM: 3.8 mmol/L (ref 3.5–5.1)
Sodium: 141 mmol/L (ref 135–145)
TOTAL PROTEIN: 5.7 g/dL — AB (ref 6.5–8.1)

## 2018-06-30 LAB — TSH: TSH: 2.038 u[IU]/mL (ref 0.350–4.500)

## 2018-06-30 MED ORDER — METOPROLOL TARTRATE 25 MG PO TABS: 25.0000 mg | ORAL_TABLET | Freq: Two times a day (BID) | ORAL | 3 refills | Status: DC

## 2018-06-30 MED ORDER — FUROSEMIDE 20 MG PO TABS: 40.0000 mg | ORAL_TABLET | Freq: Every day | ORAL | 1 refills | Status: AC

## 2018-06-30 NOTE — Progress Notes (Signed)
Primary Care Physician: Patient, No Pcp Per Referring Physician: f/u [facility]   Jordan Rowe is a 72 y.o. male with a h/o afib, HTN, DM, MR, diabetes mellitus type 2,  Schizophrenia, hospitalized last in April for pneumonia. He was hospitalized in December for bradycardia and amiodarone and rate control was stopped. Prior to that in September 2018, he had a fall with a subarachnoid hemorrhage and C6 fracture. Eliquis was stopped. He lives in a group home and attendant is with him today.  He is in the afib clinic for f/u. He is in rapid afib with v rate of 134 bpm. His weight today is 202 lbs and his prior weight was around 180 lbs. He has LLE edema present. He is a poor historian and states he feels ok.  Today, he denies symptoms of palpitations, chest pain, shortness of breath, orthopnea, PND, lower extremity edema, dizziness, presyncope, syncope, or neurologic sequela. + LEE.The patient is tolerating medications without difficulties and is otherwise without complaint today.   Past Medical History:  Diagnosis Date  . Atrial fibrillation with RVR (HCC)    [name] 09/30/2015  . BPH (benign prostatic hypertrophy)    [name] 09/30/2015  . Constipation   . Edema   . Fracture of C6 vertebra, closed (HCC) 04/2017  . GERD (gastroesophageal reflux disease)   . Glaucoma   . Hypertension   . ICB (intracranial bleed) (HCC) 04/2017   with fall  . Mental retardation    [name] 09/30/2015  . Schizophrenia (HCC)   . Stroke (HCC)   . Type II diabetes mellitus (HCC)    [name] 09/30/2015  . Vitamin D deficiency    Past Surgical History:  Procedure Laterality Date  . CATARACT EXTRACTION Left 12/2002   [name] 01/12/2011  . CATARACT EXTRACTION W/ INTRAOCULAR LENS IMPLANT Right 01/2011   [name] 02/18/2011  . MULTIPLE TOOTH EXTRACTIONS      Current Outpatient Medications  Medication Sig Dispense Refill  . acetaminophen (TYLENOL) 325 MG tablet Take 325-650 mg by mouth every 6 (six) hours as needed for mild  pain or moderate pain.     Marland Kitchen atorvastatin (LIPITOR) 10 MG tablet Take 10 mg by mouth daily.    . bisacodyl (DULCOLAX) 10 MG suppository Place 10 mg rectally as needed for moderate constipation.    . cholecalciferol (VITAMIN D) 1000 UNITS tablet Take 1,000 Units by mouth every morning.     . finasteride (PROSCAR) 5 MG tablet Take 5 mg by mouth every evening.     . furosemide (LASIX) 20 MG tablet Take 2 tablets (40 mg total) by mouth daily. 60 tablet 1  . insulin glargine (LANTUS) 100 UNIT/ML injection Inject 0.05 mLs (5 Units total) into the skin at bedtime. Hold is FSBS is less than 75 10 mL 11  . latanoprost (XALATAN) 0.005 % ophthalmic solution Place 1 drop into both eyes at bedtime.    Marland Kitchen losartan (COZAAR) 25 MG tablet Take 1 tablet (25 mg total) by mouth daily. 30 tablet 0  . olopatadine (PATANOL) 0.1 % ophthalmic solution Place 1 drop into both eyes every morning.    Marland Kitchen omeprazole (PRILOSEC) 20 MG capsule Take 40 mg by mouth every evening. 30 min prior to evening meal.    . Polyvinyl Alcohol-Povidone (REFRESH OP) Place 1 drop into both eyes 2 (two) times daily.    . risperiDONE (RISPERDAL) 0.25 MG tablet Take 0.25 mg by mouth every morning.     . risperiDONE (RISPERDAL) 2 MG tablet Take 2 mg  by mouth at bedtime.    . senna (SENOKOT) 8.6 MG TABS Take 1 tablet by mouth daily.     Marland Kitchen spironolactone (ALDACTONE) 25 MG tablet Take 25 mg by mouth daily.    . tamsulosin (FLOMAX) 0.4 MG CAPS capsule Take 1 capsule (0.4 mg total) by mouth daily. 30 capsule 0  . timolol (BETIMOL) 0.5 % ophthalmic solution Place 1 drop into both eyes daily.    . metoprolol tartrate (LOPRESSOR) 25 MG tablet Take 1 tablet (25 mg total) by mouth 2 (two) times daily. 180 tablet 3   No current facility-administered medications for this encounter.     No Known Allergies  Social History   Socioeconomic History  . Marital status: Divorced    Spouse name: Not on file  . Number of children: Not on file  . Years of  education: Not on file  . Highest education level: Not on file  Occupational History  . Not on file  Social Needs  . Financial resource strain: Not on file  . Food insecurity:    Worry: Not on file    Inability: Not on file  . Transportation needs:    Medical: Not on file    Non-medical: Not on file  Tobacco Use  . Smoking status: Current Every Day Smoker  . Smokeless tobacco: Never Used  Substance and Sexual Activity  . Alcohol use: No  . Drug use: No  . Sexual activity: Not on file  Lifestyle  . Physical activity:    Days per week: Not on file    Minutes per session: Not on file  . Stress: Not on file  Relationships  . Social connections:    Talks on phone: Not on file    Gets together: Not on file    Attends religious service: Not on file    Active member of club or organization: Not on file    Attends meetings of clubs or organizations: Not on file    Relationship status: Not on file  . Intimate partner violence:    Fear of current or ex partner: Not on file    Emotionally abused: Not on file    Physically abused: Not on file    Forced sexual activity: Not on file  Other Topics Concern  . Not on file  Social History Narrative  . Not on file    Family History  Problem Relation Age of Onset  . Hypertension Mother     ROS- All systems are reviewed and negative except as per the HPI above  Physical Exam: Vitals:   06/30/18 1110  BP: 118/74  Pulse: (!) 134  Weight: 91.6 kg  Height: 5\' 9"  (1.753 m)   Wt Readings from Last 3 Encounters:  06/30/18 91.6 kg  12/22/17 81.6 kg  12/08/17 80.3 kg    Labs: Lab Results  Component Value Date   NA 141 06/30/2018   K 3.8 06/30/2018   CL 106 06/30/2018   CO2 27 06/30/2018   GLUCOSE 131 (H) 06/30/2018   BUN 12 06/30/2018   CREATININE 1.28 (H) 06/30/2018   CALCIUM 8.7 (L) 06/30/2018   PHOS 3.3 08/08/2017   MG 1.9 08/08/2017   Lab Results  Component Value Date   INR 1.06 11/29/2017   Lab Results    Component Value Date   CHOL 133 01/14/2017   HDL 49 01/14/2017   LDLCALC 68 01/14/2017   TRIG 78 01/14/2017     GEN- The patient is chronically ill appearing,  alert but not communicative today, with assistance form nursing facility Head- normocephalic, atraumatic Eyes-  Sclera clear, conjunctiva pink Ears- hearing intact Oropharynx- clear Neck- supple, no JVP Lymph- no cervical lymphadenopathy Lungs- Clear to ausculation bilaterally, normal work of breathing Heart- rapid irregular rate and rhythm, no murmurs, rubs or gallops, PMI not laterally displaced GI- soft, NT, ND, + BS Extremities- no clubbing, cyanosis, or edema MS- no significant deformity or atrophy Skin- no rash or lesion Psych- euthymic mood, full affect Neuro- strength and sensation are intact  EKG- afib at 134 bpm Epic records reviewed   Assessment and Plan: 1. Paroxysmal afib Pt had been in SR for years on amiodarone but this was stopped in December 2/2 bradycardia IN afib with rvr today, pt unaware but is very sedenetary He is off all rate control 2/2 brady Will restart metoprolol tartrate 25 mg bid today He is not on anticoagulation 2/2 fall in September 2018 with subarachnoid hemorrhage and he was felt to be high risk to resume anticoagulation  His hospitlaization 07/2017 was 2/2 to weakness and fall as well Cmet,tsh,cbc today I will double his lasix to 40 mg daily and will see back in 7-10 days. hje will need repeat bmet then.    Elvina Sidle [facility] [address] [place], [place] [address] [phone]

## 2018-06-30 NOTE — Patient Instructions (Addendum)
Start Lasix 40mg  once a day   Start Metoprolol 25mg  twice a day  Follow up in 1 week -- please call back to schedule. [phone]

## 2018-07-04 ENCOUNTER — Emergency Department (HOSPITAL_COMMUNITY): Payer: Medicare Other

## 2018-07-04 ENCOUNTER — Inpatient Hospital Stay (HOSPITAL_COMMUNITY)
Admission: EM | Admit: 2018-07-04 | Discharge: 2018-07-07 | DRG: 291 | Disposition: A | Discharge status: Nursing Home (Includes ICF) | Payer: Medicare Other | Source: Skilled Nursing Facility | Attending: Interventional Cardiology | Admitting: Interventional Cardiology

## 2018-07-04 ENCOUNTER — Other Ambulatory Visit: Payer: Self-pay

## 2018-07-04 ENCOUNTER — Ambulatory Visit (HOSPITAL_COMMUNITY)
Admission: RE | Admit: 2018-07-04 | Discharge: 2018-07-04 | Disposition: A | Discharge status: Home / Self Care | Payer: Medicare Other | Source: Ambulatory Visit | Attending: Nurse Practitioner | Admitting: Nurse Practitioner

## 2018-07-04 ENCOUNTER — Ambulatory Visit (HOSPITAL_BASED_OUTPATIENT_CLINIC_OR_DEPARTMENT_OTHER)
Admission: RE | Admit: 2018-07-04 | Discharge: 2018-07-04 | Disposition: A | Discharge status: Home / Self Care | Payer: Medicare Other | Source: Ambulatory Visit | Attending: Nurse Practitioner | Admitting: Nurse Practitioner

## 2018-07-04 ENCOUNTER — Encounter (HOSPITAL_COMMUNITY): Payer: Self-pay | Admitting: Nurse Practitioner

## 2018-07-04 VITALS — BP 110/68 | HR 122 | Ht 69.0 in | Wt 195.0 lb

## 2018-07-04 DIAGNOSIS — E1139 Type 2 diabetes mellitus with other diabetic ophthalmic complication: Secondary | ICD-10-CM

## 2018-07-04 DIAGNOSIS — Z8673 Personal history of transient ischemic attack (TIA), and cerebral infarction without residual deficits: Secondary | ICD-10-CM | POA: Insufficient documentation

## 2018-07-04 DIAGNOSIS — Z8249 Family history of ischemic heart disease and other diseases of the circulatory system: Secondary | ICD-10-CM

## 2018-07-04 DIAGNOSIS — I428 Other cardiomyopathies: Secondary | ICD-10-CM | POA: Diagnosis present

## 2018-07-04 DIAGNOSIS — I5043 Acute on chronic combined systolic (congestive) and diastolic (congestive) heart failure: Secondary | ICD-10-CM | POA: Diagnosis not present

## 2018-07-04 DIAGNOSIS — F17210 Nicotine dependence, cigarettes, uncomplicated: Secondary | ICD-10-CM | POA: Diagnosis present

## 2018-07-04 DIAGNOSIS — N400 Enlarged prostate without lower urinary tract symptoms: Secondary | ICD-10-CM

## 2018-07-04 DIAGNOSIS — H409 Unspecified glaucoma: Secondary | ICD-10-CM | POA: Insufficient documentation

## 2018-07-04 DIAGNOSIS — Z794 Long term (current) use of insulin: Secondary | ICD-10-CM | POA: Insufficient documentation

## 2018-07-04 DIAGNOSIS — R918 Other nonspecific abnormal finding of lung field: Secondary | ICD-10-CM | POA: Insufficient documentation

## 2018-07-04 DIAGNOSIS — F209 Schizophrenia, unspecified: Secondary | ICD-10-CM

## 2018-07-04 DIAGNOSIS — F79 Unspecified intellectual disabilities: Secondary | ICD-10-CM

## 2018-07-04 DIAGNOSIS — Z961 Presence of intraocular lens: Secondary | ICD-10-CM | POA: Diagnosis present

## 2018-07-04 DIAGNOSIS — K219 Gastro-esophageal reflux disease without esophagitis: Secondary | ICD-10-CM | POA: Diagnosis present

## 2018-07-04 DIAGNOSIS — N179 Acute kidney failure, unspecified: Secondary | ICD-10-CM | POA: Diagnosis not present

## 2018-07-04 DIAGNOSIS — I130 Hypertensive heart and chronic kidney disease with heart failure and stage 1 through stage 4 chronic kidney disease, or unspecified chronic kidney disease: Secondary | ICD-10-CM | POA: Diagnosis not present

## 2018-07-04 DIAGNOSIS — I272 Pulmonary hypertension, unspecified: Secondary | ICD-10-CM | POA: Diagnosis present

## 2018-07-04 DIAGNOSIS — I4510 Unspecified right bundle-branch block: Secondary | ICD-10-CM

## 2018-07-04 DIAGNOSIS — R0989 Other specified symptoms and signs involving the circulatory and respiratory systems: Secondary | ICD-10-CM | POA: Insufficient documentation

## 2018-07-04 DIAGNOSIS — R0600 Dyspnea, unspecified: Secondary | ICD-10-CM | POA: Diagnosis not present

## 2018-07-04 DIAGNOSIS — I4891 Unspecified atrial fibrillation: Secondary | ICD-10-CM | POA: Diagnosis present

## 2018-07-04 DIAGNOSIS — N182 Chronic kidney disease, stage 2 (mild): Secondary | ICD-10-CM | POA: Diagnosis present

## 2018-07-04 DIAGNOSIS — Z9841 Cataract extraction status, right eye: Secondary | ICD-10-CM

## 2018-07-04 DIAGNOSIS — I517 Cardiomegaly: Secondary | ICD-10-CM

## 2018-07-04 DIAGNOSIS — I4819 Other persistent atrial fibrillation: Secondary | ICD-10-CM

## 2018-07-04 DIAGNOSIS — I480 Paroxysmal atrial fibrillation: Secondary | ICD-10-CM

## 2018-07-04 DIAGNOSIS — F17200 Nicotine dependence, unspecified, uncomplicated: Secondary | ICD-10-CM | POA: Insufficient documentation

## 2018-07-04 DIAGNOSIS — E876 Hypokalemia: Secondary | ICD-10-CM | POA: Diagnosis not present

## 2018-07-04 DIAGNOSIS — I10 Essential (primary) hypertension: Secondary | ICD-10-CM | POA: Insufficient documentation

## 2018-07-04 DIAGNOSIS — Z79899 Other long term (current) drug therapy: Secondary | ICD-10-CM | POA: Insufficient documentation

## 2018-07-04 DIAGNOSIS — E559 Vitamin D deficiency, unspecified: Secondary | ICD-10-CM

## 2018-07-04 DIAGNOSIS — H42 Glaucoma in diseases classified elsewhere: Secondary | ICD-10-CM

## 2018-07-04 DIAGNOSIS — R600 Localized edema: Secondary | ICD-10-CM | POA: Diagnosis not present

## 2018-07-04 DIAGNOSIS — Z9842 Cataract extraction status, left eye: Secondary | ICD-10-CM

## 2018-07-04 DIAGNOSIS — I5023 Acute on chronic systolic (congestive) heart failure: Secondary | ICD-10-CM

## 2018-07-04 DIAGNOSIS — E1122 Type 2 diabetes mellitus with diabetic chronic kidney disease: Secondary | ICD-10-CM | POA: Diagnosis present

## 2018-07-04 HISTORY — DX: Other persistent atrial fibrillation: I48.19

## 2018-07-04 LAB — BASIC METABOLIC PANEL
ANION GAP: 8 (ref 5–15)
BUN: 15 mg/dL (ref 8–23)
CALCIUM: 8.6 mg/dL — AB (ref 8.9–10.3)
CO2: 28 mmol/L (ref 22–32)
Chloride: 107 mmol/L (ref 98–111)
Creatinine, Ser: 1.27 mg/dL — ABNORMAL HIGH (ref 0.61–1.24)
GFR calc Af Amer: >60 mL/min (ref >60)
GFR calc non Af Amer: 56 mL/min — ABNORMAL LOW (ref >60)
GLUCOSE: 145 mg/dL — AB (ref 70–99)
Potassium: 3.4 mmol/L — ABNORMAL LOW (ref 3.5–5.1)
Sodium: 143 mmol/L (ref 135–145)

## 2018-07-04 LAB — URINALYSIS, ROUTINE W REFLEX MICROSCOPIC
Bilirubin Urine: NEGATIVE
Glucose, UA: NEGATIVE mg/dL
HGB URINE DIPSTICK: NEGATIVE
Ketones, ur: NEGATIVE mg/dL
LEUKOCYTES UA: NEGATIVE
NITRITE: NEGATIVE
Protein, ur: NEGATIVE mg/dL
SPECIFIC GRAVITY, URINE: 1.006 (ref 1.005–1.030)
pH: 5 (ref 5.0–8.0)

## 2018-07-04 LAB — COMPREHENSIVE METABOLIC PANEL
ALBUMIN: 3.2 g/dL — AB (ref 3.5–5.0)
ALK PHOS: 63 U/L (ref 38–126)
ALT: 28 U/L (ref 0–44)
AST: 17 U/L (ref 15–41)
Anion gap: 10 (ref 5–15)
BUN: 15 mg/dL (ref 8–23)
CHLORIDE: 108 mmol/L (ref 98–111)
CO2: 25 mmol/L (ref 22–32)
CREATININE: 1.26 mg/dL — AB (ref 0.61–1.24)
Calcium: 8.7 mg/dL — ABNORMAL LOW (ref 8.9–10.3)
GFR calc Af Amer: >60 mL/min (ref >60)
GFR calc non Af Amer: 56 mL/min — ABNORMAL LOW (ref >60)
GLUCOSE: 136 mg/dL — AB (ref 70–99)
Potassium: 3.7 mmol/L (ref 3.5–5.1)
SODIUM: 143 mmol/L (ref 135–145)
Total Bilirubin: 0.8 mg/dL (ref 0.3–1.2)
Total Protein: 5.5 g/dL — ABNORMAL LOW (ref 6.5–8.1)

## 2018-07-04 LAB — CBC WITH DIFFERENTIAL/PLATELET
ABS IMMATURE GRANULOCYTES: 0.01 10*3/uL (ref 0.00–0.07)
Basophils Absolute: 0 10*3/uL (ref 0.0–0.1)
Basophils Relative: 1 %
Eosinophils Absolute: 0.1 10*3/uL (ref 0.0–0.5)
Eosinophils Relative: 1 %
HCT: 47.4 % (ref 39.0–52.0)
Hemoglobin: 14.7 g/dL (ref 13.0–17.0)
Immature Granulocytes: 0 %
LYMPHS ABS: 2 10*3/uL (ref 0.7–4.0)
LYMPHS PCT: 39 %
MCH: 28.8 pg (ref 26.0–34.0)
MCHC: 31 g/dL (ref 30.0–36.0)
MCV: 92.9 fL (ref 80.0–100.0)
MONOS PCT: 10 %
Monocytes Absolute: 0.5 10*3/uL (ref 0.1–1.0)
NEUTROS ABS: 2.5 10*3/uL (ref 1.7–7.7)
Neutrophils Relative %: 49 %
PLATELETS: 145 10*3/uL — AB (ref 150–400)
RBC: 5.1 MIL/uL (ref 4.22–5.81)
RDW: 15.5 % (ref 11.5–15.5)
WBC: 5.1 10*3/uL (ref 4.0–10.5)
nRBC: 0 % (ref 0.0–0.2)

## 2018-07-04 LAB — I-STAT VENOUS BLOOD GAS, ED
Acid-Base Excess: 2 mmol/L (ref 0.0–2.0)
BICARBONATE: 27.9 mmol/L (ref 20.0–28.0)
O2 Saturation: 31 %
PCO2 VEN: 47.9 mmHg (ref 44.0–60.0)
PH VEN: 7.373 (ref 7.250–7.430)
PO2 VEN: 20 mmHg — AB (ref 32.0–45.0)
TCO2: 29 mmol/L (ref 22–32)

## 2018-07-04 LAB — PROTIME-INR
INR: 1.16
PROTHROMBIN TIME: 14.7 s (ref 11.4–15.2)

## 2018-07-04 LAB — I-STAT CG4 LACTIC ACID, ED: Lactic Acid, Venous: 1.61 mmol/L (ref 0.5–1.9)

## 2018-07-04 LAB — GLUCOSE, CAPILLARY
GLUCOSE-CAPILLARY: 153 mg/dL — AB (ref 70–99)
GLUCOSE-CAPILLARY: 150 mg/dL — AB (ref 70–99)

## 2018-07-04 LAB — BRAIN NATRIURETIC PEPTIDE: B Natriuretic Peptide: 945.8 pg/mL — ABNORMAL HIGH (ref 0.0–100.0)

## 2018-07-04 MED ORDER — RISPERIDONE 0.25 MG PO TABS
0.2500 mg | ORAL_TABLET | ORAL | Status: DC
  Administered 2018-07-05 – 2018-07-07 (×3): 0.25 mg via ORAL
  Filled 2018-07-04 (×3): qty 1

## 2018-07-04 MED ORDER — ATORVASTATIN CALCIUM 10 MG PO TABS
10.0000 mg | ORAL_TABLET | Freq: Every day | ORAL | Status: DC
  Administered 2018-07-04 – 2018-07-06 (×3): 10 mg via ORAL
  Filled 2018-07-04 (×3): qty 1

## 2018-07-04 MED ORDER — INSULIN ASPART 100 UNIT/ML ~~LOC~~ SOLN
0.0000 [IU] | Freq: Three times a day (TID) | SUBCUTANEOUS | Status: DC
  Administered 2018-07-04 – 2018-07-05 (×2): 3 [IU] via SUBCUTANEOUS
  Administered 2018-07-05: 2 [IU] via SUBCUTANEOUS
  Administered 2018-07-06 (×2): 3 [IU] via SUBCUTANEOUS

## 2018-07-04 MED ORDER — OLOPATADINE HCL 0.1 % OP SOLN: 1.0000 [drp] | OPHTHALMIC | Status: DC

## 2018-07-04 MED ORDER — METOPROLOL TARTRATE 50 MG PO TABS
50.0000 mg | ORAL_TABLET | Freq: Two times a day (BID) | ORAL | Status: DC
  Administered 2018-07-04 – 2018-07-07 (×6): 50 mg via ORAL
  Filled 2018-07-04 (×6): qty 1

## 2018-07-04 MED ORDER — VITAMIN D 1000 UNITS PO TABS
1000.0000 [IU] | ORAL_TABLET | Freq: Every morning | ORAL | Status: DC
  Administered 2018-07-05 – 2018-07-07 (×3): 1000 [IU] via ORAL
  Filled 2018-07-04: qty 1

## 2018-07-04 MED ORDER — BRINZOLAMIDE 1 % OP SUSP
1.0000 [drp] | Freq: Two times a day (BID) | OPHTHALMIC | Status: DC
  Administered 2018-07-04 – 2018-07-07 (×6): 1 [drp] via OPHTHALMIC
  Filled 2018-07-04: qty 10

## 2018-07-04 MED ORDER — FINASTERIDE 5 MG PO TABS
5.0000 mg | ORAL_TABLET | Freq: Every evening | ORAL | Status: DC
  Administered 2018-07-04 – 2018-07-07 (×4): 5 mg via ORAL
  Filled 2018-07-04 (×4): qty 1

## 2018-07-04 MED ORDER — POTASSIUM CHLORIDE CRYS ER 10 MEQ PO TBCR
10.0000 meq | EXTENDED_RELEASE_TABLET | Freq: Two times a day (BID) | ORAL | Status: DC
  Administered 2018-07-04: 10 meq via ORAL
  Filled 2018-07-04 (×2): qty 1

## 2018-07-04 MED ORDER — HEPARIN SODIUM (PORCINE) 5000 UNIT/ML IJ SOLN
5000.0000 [IU] | Freq: Three times a day (TID) | INTRAMUSCULAR | Status: DC
  Administered 2018-07-04 – 2018-07-07 (×8): 5000 [IU] via SUBCUTANEOUS
  Filled 2018-07-04 (×8): qty 1

## 2018-07-04 MED ORDER — RISPERIDONE 2 MG PO TABS
2.0000 mg | ORAL_TABLET | Freq: Every day | ORAL | Status: DC
  Administered 2018-07-04 – 2018-07-06 (×3): 2 mg via ORAL
  Filled 2018-07-04 (×4): qty 1

## 2018-07-04 MED ORDER — FUROSEMIDE 10 MG/ML IJ SOLN
40.0000 mg | Freq: Two times a day (BID) | INTRAMUSCULAR | Status: DC
  Administered 2018-07-04 – 2018-07-06 (×4): 40 mg via INTRAVENOUS
  Filled 2018-07-04 (×4): qty 4

## 2018-07-04 MED ORDER — TIMOLOL MALEATE 0.5 % OP SOLN
1.0000 [drp] | Freq: Every day | OPHTHALMIC | Status: DC
  Administered 2018-07-05 – 2018-07-07 (×3): 1 [drp] via OPHTHALMIC
  Filled 2018-07-04: qty 5

## 2018-07-04 MED ORDER — PANTOPRAZOLE SODIUM 40 MG PO TBEC
40.0000 mg | DELAYED_RELEASE_TABLET | Freq: Every day | ORAL | Status: DC
  Administered 2018-07-05 – 2018-07-07 (×3): 40 mg via ORAL
  Filled 2018-07-04 (×3): qty 1

## 2018-07-04 MED ORDER — NITROGLYCERIN 0.4 MG SL SUBL: 0.4000 mg | SUBLINGUAL_TABLET | SUBLINGUAL | Status: DC | PRN

## 2018-07-04 MED ORDER — METOPROLOL TARTRATE 25 MG PO TABS: 37.5000 mg | ORAL_TABLET | Freq: Two times a day (BID) | ORAL | Status: DC

## 2018-07-04 MED ORDER — TAMSULOSIN HCL 0.4 MG PO CAPS
0.4000 mg | ORAL_CAPSULE | Freq: Every day | ORAL | Status: DC
  Administered 2018-07-05 – 2018-07-07 (×3): 0.4 mg via ORAL
  Filled 2018-07-04 (×3): qty 1

## 2018-07-04 MED ORDER — LOSARTAN POTASSIUM 25 MG PO TABS
25.0000 mg | ORAL_TABLET | Freq: Two times a day (BID) | ORAL | Status: DC
  Administered 2018-07-04 – 2018-07-07 (×6): 25 mg via ORAL
  Filled 2018-07-04 (×6): qty 1

## 2018-07-04 MED ORDER — FUROSEMIDE 10 MG/ML IJ SOLN
40.0000 mg | Freq: Once | INTRAMUSCULAR | Status: AC
  Administered 2018-07-04: 40 mg via INTRAVENOUS
  Filled 2018-07-04: qty 4

## 2018-07-04 MED ORDER — DILTIAZEM HCL-DEXTROSE 100-5 MG/100ML-% IV SOLN (PREMIX)
5.0000 mg/h | INTRAVENOUS | Status: DC
  Filled 2018-07-04: qty 100

## 2018-07-04 MED ORDER — OLOPATADINE HCL 0.1 % OP SOLN
1.0000 [drp] | Freq: Two times a day (BID) | OPHTHALMIC | Status: DC
  Administered 2018-07-04 – 2018-07-07 (×6): 1 [drp] via OPHTHALMIC
  Filled 2018-07-04: qty 5

## 2018-07-04 MED ORDER — LATANOPROST 0.005 % OP SOLN
1.0000 [drp] | Freq: Every day | OPHTHALMIC | Status: DC
  Administered 2018-07-04 – 2018-07-06 (×3): 1 [drp] via OPHTHALMIC
  Filled 2018-07-04: qty 2.5

## 2018-07-04 MED ORDER — ACETAMINOPHEN 325 MG PO TABS: 650.0000 mg | ORAL_TABLET | ORAL | Status: DC | PRN

## 2018-07-04 MED ORDER — SENNA 8.6 MG PO TABS
1.0000 | ORAL_TABLET | Freq: Every day | ORAL | Status: DC
  Administered 2018-07-05 – 2018-07-07 (×3): 8.6 mg via ORAL
  Filled 2018-07-04 (×3): qty 1

## 2018-07-04 MED ORDER — ONDANSETRON HCL 4 MG/2ML IJ SOLN: 4.0000 mg | Freq: Four times a day (QID) | INTRAMUSCULAR | Status: DC | PRN

## 2018-07-04 NOTE — H&P (Addendum)
Cardiology Admission History and Physical:   Patient ID: Jordan Rowe MRN: [id number]; DOB: Jan 18, 1946   Admission date: 07/04/2018  Primary Care Provider: Patient, No Pcp Per Primary Cardiologist: Nicki Guadalajara, MD   Chief Complaint:  LE edema and afib  Patient Profile:   Jordan Rowe is a 72 y.o. 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   He was initially evaluated in the ED in January 2017 with chest pain, he was noted to be in atrial fibrillation with RVR of unknown duration at that time.  He was initially started on IV Cardizem, this was later switched to carvedilol.  He was also placed on amiodarone and Eliquis due to elevated CHA2DS2-Vasc score.  Echocardiogram at the time showed EF 20-25%.  He was already on ACE inhibitor prior to admission.  He was also started on spironolactone as well.  Myoview showed no ischemia.  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    Unfortunately, patient suffered a fall in September and CT of the head showed a small subarachnoid hemorrhage with C6 mild fracture.  C6 fracture was managed by soft collar, no surgery planned.  Neurosurgery was consulted for subarachnoid hemorrhage, felt the patient is not a surgery candidate either.  His Eliquis was discontinued.  He has been on hospice.  He returned to the hospital in December with weakness and was evaluated for bradycardia.  His systolic blood pressure was hypertensive in the 170s.  EKG showed sinus bradycardia with prolonged QTC.  After admission, his amiodarone and carvedilol were held.  His heart rate eventually improved.   A repeat echocardiogram showed ejection fraction has improved to 45-50%, grade 2 DD, mild AI, PA peak pressure 20 mmHg.   History of Present Illness:   Mr. Bublitz resident of group home.  He was seen in afib clinic 06/30/18 for routine follow up. Note in afib AVR at rate of 134 as well as volume overload. Started on metoprolol 25mg  BID and increased lasix to 40mg  qd. Seen in afib clinic today. Weight lost 5 lb. Noted labored breathing. Per note, nursing home resident noted labored breathing with minimal walking. Mostly wheel chair bound. Patient is not a good historian. Send to ER for further evaluation. Hx limited due to MR. Denies chest pain or sob.   Scr stable at 1.26. Normal lactic acid. UA negative. CXR with mild CHF. EKG showed afib at rate of 92 bpm - personally reviewed. Tele showed HR of 90-100s in afib. Pending BNP. Given IV lasix x 1 in ER.    Past Medical History:  Diagnosis Date  . Atrial fibrillation with RVR (HCC)    [name] 09/30/2015  . BPH (benign prostatic hypertrophy)    [name] 09/30/2015  . Constipation   . Edema   . Fracture of C6 vertebra, closed (HCC) 04/2017  . GERD (gastroesophageal reflux disease)   . Glaucoma   . Hypertension   . ICB (intracranial bleed) (HCC) 04/2017   with fall  . Mental retardation    [name] 09/30/2015  . Schizophrenia (HCC)   . Stroke (HCC)   . Type II diabetes mellitus (HCC)    [name] 09/30/2015  . Vitamin D deficiency     Past Surgical History:  Procedure Laterality Date  . CATARACT EXTRACTION Left 12/2002   [name] 01/12/2011  . CATARACT EXTRACTION W/ INTRAOCULAR LENS IMPLANT Right 01/2011   /  notes 02/18/2011  . MULTIPLE TOOTH EXTRACTIONS       Medications Prior to Admission: Prior to Admission medications   Medication Sig Start Date End Date Taking? Authorizing Provider  acetaminophen (TYLENOL) 325 MG tablet Take 325-650 mg by mouth every 6 (six) hours as needed for mild pain or moderate pain.    Yes [provider]  atorvastatin (LIPITOR) 10 MG tablet Take 10 mg by mouth at bedtime.    Yes [provider]  Bepotastine Besilate 1.5 % SOLN Place 1 drop into both eyes 2 (two) times daily.   Yes  [provider]  brinzolamide (AZOPT) 1 % ophthalmic suspension Place 1 drop into both eyes 2 (two) times daily.   Yes [provider]  cholecalciferol (VITAMIN D) 1000 UNITS tablet Take 1,000 Units by mouth every morning.    Yes [provider]  finasteride (PROSCAR) 5 MG tablet Take 5 mg by mouth every evening.    Yes [provider]  furosemide (LASIX) 20 MG tablet Take 2 tablets (40 mg total) by mouth daily. 06/30/18  Yes Newman Nip, NP  insulin glargine (LANTUS) 100 UNIT/ML injection Inject 0.05 mLs (5 Units total) into the skin at bedtime. Hold is FSBS is less than 75 10/07/15  Yes Lendell Caprice, Corinna L, MD  latanoprost (XALATAN) 0.005 % ophthalmic solution Place 1 drop into both eyes at bedtime.   Yes [provider]  losartan (COZAAR) 25 MG tablet Take 1 tablet (25 mg total) by mouth daily. Patient taking differently: Take 25 mg by mouth 2 (two) times daily.  08/11/17  Yes Lockamy, Marcial Pacas, DO  metoprolol tartrate (LOPRESSOR) 25 MG tablet Take 1 tablet (25 mg total) by mouth 2 (two) times daily. 06/30/18 09/28/18 Yes Newman Nip, NP  olopatadine (PATANOL) 0.1 % ophthalmic solution Place 1 drop into both eyes every morning.   Yes [provider]  omeprazole (PRILOSEC) 20 MG capsule Take 40 mg by mouth every morning.    Yes [provider]  Polyvinyl Alcohol-Povidone (REFRESH OP) Place 1 drop into both eyes 2 (two) times daily.   Yes [provider]  risperiDONE (RISPERDAL) 0.25 MG tablet Take 0.25 mg by mouth every morning.    Yes [provider]  risperiDONE (RISPERDAL) 2 MG tablet Take 2 mg by mouth at bedtime.   Yes [provider]  senna (SENOKOT) 8.6 MG TABS Take 1 tablet by mouth daily.    Yes [provider]  tamsulosin (FLOMAX) 0.4 MG CAPS capsule Take 1 capsule (0.4 mg total) by mouth daily. 10/22/16  Yes West, Emily, PA-C  timolol (BETIMOL) 0.5 % ophthalmic solution Place 1 drop  into both eyes daily.   Yes [provider]     Allergies:   No Known Allergies  Social History:   Social History   Socioeconomic History  . Marital status: Divorced    Spouse name: Not on file  . Number of children: Not on file  . Years of education: Not on file  . Highest education level: Not on file  Occupational History  . Not on file  Social Needs  . Financial resource strain: Not on file  . Food insecurity:    Worry: Not on file    Inability: Not on file  . Transportation needs:    Medical: Not on file    Non-medical: Not on file  Tobacco Use  . Smoking status: Current Every Day Smoker  . Smokeless tobacco: Never Used  Substance and  Sexual Activity  . Alcohol use: No  . Drug use: No  . Sexual activity: Not on file  Lifestyle  . Physical activity:    Days per week: Not on file    Minutes per session: Not on file  . Stress: Not on file  Relationships  . Social connections:    Talks on phone: Not on file    Gets together: Not on file    Attends religious service: Not on file    Active member of club or organization: Not on file    Attends meetings of clubs or organizations: Not on file    Relationship status: Not on file  . Intimate partner violence:    Fear of current or ex partner: Not on file    Emotionally abused: Not on file    Physically abused: Not on file    Forced sexual activity: Not on file  Other Topics Concern  . Not on file  Social History Narrative  . Not on file    Family History:  The patient's family history includes Hypertension in his mother.    ROS:  Please see the history of present illness.  All other ROS reviewed and negative.     Physical Exam/Data:   Vitals:   07/04/18 1043 07/04/18 1200 07/04/18 1300 07/04/18 1400  BP: 101/81 119/90 113/90 (!) 112/101  Pulse: 96 69 96 95  Resp: 14 (!) 28 (!) 0   Temp: (!) 97.5 F (36.4 C)     TempSrc: Oral     SpO2: 99% 93% 97% 98%   No intake or output data in the 24  hours ending 07/04/18 1507 There were no vitals filed for this visit. There is no height or weight on file to calculate BMI.  General:  Ill appearing AA male in  no acute distress HEENT: normal Lymph: no adenopathy Neck: JVD difficult to assess due to beard  Endocrine:  No thryomegaly Vascular: No carotid bruits; FA pulses 2+ bilaterally without bruits  Cardiac:  normal S1, S2; Ir IR ; no murmur Lungs:  clear to auscultation bilaterally, no wheezing, rhonchi or rales  Abd: soft, nontender, no hepatomegaly  Ext: 1 + BL LE edema Musculoskeletal:  No deformities, Skin: warm and dry  Neuro: no focal abnormalities noted. Hard determine speaking  Psych: demented   Relevant CV Studies:  Echo 08/08/17 Study Conclusions  - Left ventricle: The cavity size was mildly dilated. Systolic   function was mildly reduced. The estimated ejection fraction was   in the range of 45% to 50%. Mild diffuse hypokinesis. Features   are consistent with a pseudonormal left ventricular filling   pattern, with concomitant abnormal relaxation and increased   filling pressure (grade 2 diastolic dysfunction). - Aortic valve: Transvalvular velocity was within the normal range.   There was no stenosis. There was mild regurgitation. - Mitral valve: Transvalvular velocity was within the normal range.   There was no evidence for stenosis. There was no regurgitation. - Left atrium: The atrium was moderately dilated. - Right ventricle: The cavity size was normal. Wall thickness was   normal. Systolic function was normal. - Right atrium: The atrium was mildly dilated. - Atrial septum: No defect or patent foramen ovale was identified   by color flow Doppler. - Tricuspid valve: There was mild regurgitation. - Pulmonary arteries: Systolic pressure was within the normal   range. PA peak pressure: 20 mm Hg (S).  Laboratory Data:  Chemistry Recent Labs  Lab 07/04/18 [phone]  07/04/18 1140  NA 143 143  K 3.4* 3.7  CL  107 108  CO2 28 25  GLUCOSE 145* 136*  BUN 15 15  CREATININE 1.27* 1.26*  CALCIUM 8.6* 8.7*  GFRNONAA 56* 56*  GFRAA >60 >60  ANIONGAP 8 10    Recent Labs  Lab 06/30/18 1129 07/04/18 1140  PROT 5.7* 5.5*  ALBUMIN 3.2* 3.2*  AST 15 17  ALT 26 28  ALKPHOS 63 63  BILITOT 0.9 0.8   Hematology Recent Labs  Lab 06/30/18 1129 07/04/18 1140  WBC 4.3 5.1  RBC 4.86 5.10  HGB 14.3 14.7  HCT 44.4 47.4  MCV 91.4 92.9  MCH 29.4 28.8  MCHC 32.2 31.0  RDW 15.3 15.5  PLT 170 145*   Radiology/Studies:  Dg Chest 2 View  Result Date: 07/04/2018 CLINICAL DATA:  Short of breath, cough for a week EXAM: CHEST - 2 VIEW COMPARISON:  Chest x-ray of 07/04/2018 FINDINGS: On this erect view, there are bilateral pleural effusions present with bibasilar atelectasis. On the lateral view there is some fluid noted within the fissures, and there is mild cardiomegaly present. This may indicate mild congestive heart failure. No focal infiltrate is seen. There are healed rib fractures present on the right. IMPRESSION: Cardiomegaly and small effusions with a small amount of fluid in the fissures suggesting mild CHF. No definite pneumonia. Electronically Signed   By: Dwyane Dee M.D.   On: 07/04/2018 11:22   Dg Chest 2 View  Result Date: 07/04/2018 CLINICAL DATA:  Shortness of breath and cough. EXAM: CHEST - 2 VIEW COMPARISON:  11/30/2017 and prior radiographs FINDINGS: Cardiomegaly and pulmonary vascular congestion noted. Bibasilar opacities are identified, question atelectasis versus airspace disease/pneumonia. Probable trace bilateral pleural effusions noted. No pneumothorax or acute bony abnormality. Remote rib fractures are again noted. IMPRESSION: Cardiomegaly with pulmonary vascular congestion and probable trace bilateral pleural effusions. Bibasilar opacities, question atelectasis versus airspace disease/pneumonia. Electronically Signed   By: Harmon Pier M.D.   On: 07/04/2018 09:50    Assessment and  Plan:   1. Acute on chronic combined CHG with hx of tachy mediated NICM - LVEF of 20-25% in 2017 which improved to 45-50% with grade 2 DD by echo 07/2017 with restoration of sinus rhythm.  - Now again back in afib. CXR with mild CHF. Pending BNP. Lost 5lb since increasing lasix to 40mg  qd.  - Admit and diuresed. Repeat echo (suspect down EF given recurrent afib). Daily weight and strict I & O.   2. Persistent atrial fibrillation  - Pt had been in SR for years on amiodarone but this was stopped as well as BB, in December 2018, 2/2 bradycardia. CHADVASC score of 4 He is not on anticoagulation 2/2 fall in September 2018 with subarachnoid hemorrhage and he was felt to be high risk to resume anticoagulation  - Rate improved with restart metoprolol. Can not tell if goes fast.  - Increase metoprolol to 37.5mg  BID. No anticoagulation as above. SQ heparin for DVT prophylaxis.   3. DM - SSI while here  4. HTN - BP stable. BB as above  Severity of Illness: The appropriate patient status for this patient is OBSERVATION. Observation status is judged to be reasonable and necessary in order to provide the required intensity of service to ensure the patient's safety. The patient's presenting symptoms, physical exam findings, and initial radiographic and laboratory data in the context of their medical condition is felt to place them at decreased risk for further clinical  deterioration. Furthermore, it is anticipated that the patient will be medically stable for discharge from the hospital within 2 midnights of admission. The following factors support the patient status of observation.   " The patient's presenting symptoms include SOB and LE edema. " The physical exam findings include LE edema " The initial radiographic and laboratory data are afib     For questions or updates, please contact [facility] Please consult [email or web address] for contact info under        Lorelei Pont, PA    07/04/2018 3:07 PM   I have examined the patient and reviewed assessment and plan and discussed with patient.  Agree with above as stated.  Plan for IV Lasix and increased metoprolol to help with rate control.  He is hoping this is a short hospital stay.  WIll recheck LVEF as well.  He has had tachycardia mediated cardiomyopathy in the past.   AFib may be contributing to volume overload through loss of atrial kick, or through affecting LV function.    Lance Muss

## 2018-07-04 NOTE — Progress Notes (Signed)
Primary Care Physician: Patient, No Pcp Per Referring Physician: f/u [facility]   Regginald Pask is a 72 y.o. male with a h/o afib, HTN, DM, MR, diabetes mellitus type 2,  Schizophrenia, hospitalized last in April for pneumonia. He was hospitalized in December for bradycardia and amiodarone and rate control was stopped. Prior to that in September 2018, he had a fall with a subarachnoid hemorrhage and C6 fracture. Eliquis was stopped. He lives in a group home and attendant is with him today.  He is in the afib clinic for f/u. He is in rapid afib with v rate of 134 bpm. His weight today is 202 lbs and his prior weight was around 180 lbs. He has LLE edema present. He is a poor historian and states he feels ok.  F/u 11/5. He remains in afib with RVR, despite the restart of metoprolol last week. His weight is down 5 lbs but breathing appears  labored today. He does continue to smoke. His nursing home assistant states that he is much weaker this morningt, and his breathing was worse   trying to get him in and out from [place].  Pt is not a good historian, usually adding not much of anything to the conversation and states his breathing may be some worse this week.   Today, he denies symptoms of palpitations, chest pain,+ shortness of breath, - orthopnea, PND, dizziness, presyncope, syncope, or neurologic sequela. + lethargy, LE Edema  he patient is tolerating medications without difficulties and is otherwise without complaint today.   Past Medical History:  Diagnosis Date  . Atrial fibrillation with RVR (HCC)    [name] 09/30/2015  . BPH (benign prostatic hypertrophy)    [name] 09/30/2015  . Constipation   . Edema   . Fracture of C6 vertebra, closed (HCC) 04/2017  . GERD (gastroesophageal reflux disease)   . Glaucoma   . Hypertension   . ICB (intracranial bleed) (HCC) 04/2017   with fall  . Mental retardation    [name] 09/30/2015  . Schizophrenia (HCC)   . Stroke (HCC)   . Type II diabetes mellitus (HCC)     [name] 09/30/2015  . Vitamin D deficiency    Past Surgical History:  Procedure Laterality Date  . CATARACT EXTRACTION Left 12/2002   [name] 01/12/2011  . CATARACT EXTRACTION W/ INTRAOCULAR LENS IMPLANT Right 01/2011   [name] 02/18/2011  . MULTIPLE TOOTH EXTRACTIONS      Current Outpatient Medications  Medication Sig Dispense Refill  . acetaminophen (TYLENOL) 325 MG tablet Take 325-650 mg by mouth every 6 (six) hours as needed for mild pain or moderate pain.     Marland Kitchen atorvastatin (LIPITOR) 10 MG tablet Take 10 mg by mouth daily.    . bisacodyl (DULCOLAX) 10 MG suppository Place 10 mg rectally as needed for moderate constipation.    . cholecalciferol (VITAMIN D) 1000 UNITS tablet Take 1,000 Units by mouth every morning.     . finasteride (PROSCAR) 5 MG tablet Take 5 mg by mouth every evening.     . furosemide (LASIX) 20 MG tablet Take 2 tablets (40 mg total) by mouth daily. 60 tablet 1  . insulin glargine (LANTUS) 100 UNIT/ML injection Inject 0.05 mLs (5 Units total) into the skin at bedtime. Hold is FSBS is less than 75 10 mL 11  . latanoprost (XALATAN) 0.005 % ophthalmic solution Place 1 drop into both eyes at bedtime.    Marland Kitchen losartan (COZAAR) 25 MG tablet Take 1 tablet (25 mg total) by  mouth daily. 30 tablet 0  . metoprolol tartrate (LOPRESSOR) 25 MG tablet Take 1 tablet (25 mg total) by mouth 2 (two) times daily. 180 tablet 3  . olopatadine (PATANOL) 0.1 % ophthalmic solution Place 1 drop into both eyes every morning.    Marland Kitchen omeprazole (PRILOSEC) 20 MG capsule Take 40 mg by mouth every evening. 30 min prior to evening meal.    . Polyvinyl Alcohol-Povidone (REFRESH OP) Place 1 drop into both eyes 2 (two) times daily.    . risperiDONE (RISPERDAL) 0.25 MG tablet Take 0.25 mg by mouth every morning.     . risperiDONE (RISPERDAL) 2 MG tablet Take 2 mg by mouth at bedtime.    . senna (SENOKOT) 8.6 MG TABS Take 1 tablet by mouth daily.     Marland Kitchen spironolactone (ALDACTONE) 25 MG tablet Take 25 mg by  mouth daily.    . tamsulosin (FLOMAX) 0.4 MG CAPS capsule Take 1 capsule (0.4 mg total) by mouth daily. 30 capsule 0  . timolol (BETIMOL) 0.5 % ophthalmic solution Place 1 drop into both eyes daily.     No current facility-administered medications for this encounter.     No Known Allergies  Social History   Socioeconomic History  . Marital status: Divorced    Spouse name: Not on file  . Number of children: Not on file  . Years of education: Not on file  . Highest education level: Not on file  Occupational History  . Not on file  Social Needs  . Financial resource strain: Not on file  . Food insecurity:    Worry: Not on file    Inability: Not on file  . Transportation needs:    Medical: Not on file    Non-medical: Not on file  Tobacco Use  . Smoking status: Current Every Day Smoker  . Smokeless tobacco: Never Used  Substance and Sexual Activity  . Alcohol use: No  . Drug use: No  . Sexual activity: Not on file  Lifestyle  . Physical activity:    Days per week: Not on file    Minutes per session: Not on file  . Stress: Not on file  Relationships  . Social connections:    Talks on phone: Not on file    Gets together: Not on file    Attends religious service: Not on file    Active member of club or organization: Not on file    Attends meetings of clubs or organizations: Not on file    Relationship status: Not on file  . Intimate partner violence:    Fear of current or ex partner: Not on file    Emotionally abused: Not on file    Physically abused: Not on file    Forced sexual activity: Not on file  Other Topics Concern  . Not on file  Social History Narrative  . Not on file    Family History  Problem Relation Age of Onset  . Hypertension Mother     ROS- All systems are reviewed and negative except as per the HPI above  Physical Exam: Vitals:   07/04/18 0909  BP: 110/68  Pulse: (!) 122  SpO2: 94%  Weight: 88.5 kg  Height: 5\' 9"  (1.753 m)   Wt  Readings from Last 3 Encounters:  07/04/18 88.5 kg  06/30/18 91.6 kg  12/22/17 81.6 kg    Labs: Lab Results  Component Value Date   NA 141 06/30/2018   K 3.8 06/30/2018   CL  106 06/30/2018   CO2 27 06/30/2018   GLUCOSE 131 (H) 06/30/2018   BUN 12 06/30/2018   CREATININE 1.28 (H) 06/30/2018   CALCIUM 8.7 (L) 06/30/2018   PHOS 3.3 08/08/2017   MG 1.9 08/08/2017   Lab Results  Component Value Date   INR 1.06 11/29/2017   Lab Results  Component Value Date   CHOL 133 01/14/2017   HDL 49 01/14/2017   LDLCALC 68 01/14/2017   TRIG 78 01/14/2017     GEN- The patient is chronically ill appearing, alert but minimally  communicative today  Head- normocephalic, atraumatic Eyes-  Sclera clear, conjunctiva pink Ears- hearing intact Oropharynx- clear Neck- supple, no JVP Lymph- no cervical lymphadenopathy Lungs- Clear to ausculation bilaterally, increased  work of breathing PO 96% Heart- rapid irregular rate and rhythm, no murmurs, rubs or gallops, PMI not laterally displaced GI- soft, NT, ND, + BS Extremities- no clubbing, cyanosis, 1+ edema on rt/2+ on left LLE MS- no significant deformity or atrophy Skin- no rash or lesion Psych- euthymic mood, full affect Neuro- strength and sensation are intact  EKG- afib at 122 bpm Epic records reviewed CXR IMPRESSION:Done stat today  Cardiomegaly with pulmonary vascular congestion and probable trace bilateral pleural effusions.  Bibasilar opacities, question atelectasis versus airspace Disease/pneumonia.  Bmet pending  Assessment and Plan: 1. Paroxysmal afib with RVR Pt had been in SR for years on amiodarone but this was stopped as well as BB, in December 2018, 2/2 bradycardia  Continues with afib with rvr today, noted last week on f/u, despite metoprolol tartrate 25 mg  being  restarted  He is not on anticoagulation 2/2 fall in September 2018 with subarachnoid hemorrhage and he was felt to be high risk to resume  anticoagulation  His weight is down 5 lbs to 195 lbs, from increase of lasix to 40 mg daily , increased last week, but his dry weight is closer to 178  lbs  CXR done due to  increased respiratory effort, shows pulmonary vascular congestion vrs early pneumonia( smoker)  I will send to the ER for further treatment of either HF from afib with RVR, has not responded well to outpt med changes from  last week, as well as  possible early pneumonia     Lupita Leash C. Matthew Fol[facility] [address] [place], [place] [address] [phone]

## 2018-07-04 NOTE — ED Notes (Signed)
Got patient undress on the monitor patient is resting with call bell in reach and nurse at bedside   

## 2018-07-04 NOTE — ED Triage Notes (Signed)
Pt is a poor historian, pt states, "the person that works at my apartment brought me here. I was told I had pneumonia." pt unable to verbalize who told him this, pt in afib in ED, pt A&O x4, seems lethargic, afebrile

## 2018-07-04 NOTE — Progress Notes (Signed)
Patient's HR back down to 90s-low 100's. Paged Bhagat,PA. Orders D/C IV Cardizem and start PO metoprolol 50 mg BID. Will continue to monitor.

## 2018-07-04 NOTE — ED Notes (Signed)
Pt has flat affect- will answer questions, but whispers. resp are unlabored but has periods of fast breathing then normal shallow breathing- pt is in AFib on monitor.  Has clear nasal drainage that he does not attempt to wipe himself.

## 2018-07-04 NOTE — ED Provider Notes (Addendum)
[facility] EMERGENCY DEPARTMENT Provider Note   CSN: [id number] Arrival date & time: 07/04/18  1013     History   Chief Complaint Chief Complaint  Patient presents with  . Shortness of Breath    HPI Jordan Rowe is a 72 y.o. male.  HPI  72 y.o. male with history of Afib not on anticoagulation due to fall risk, HTN, DM,MR,diabetes mellitus type 2,  Schizophrenia who was sent to the ER from A. fib clinic for A. fib with RVR and CHF.  Patient is not a good historian secondary to his MR.  Patient reports that he lives at [facility] now.  He has no complaints at this time, but states that he has had shortness of breath and weakness in the recent past.  Patient denies any chest pain, cough, fevers, nausea, vomiting.  Cardiology note indicates that when patient was in the clinic his heart rate was in the 130s and patient was complaining of shortness of breath.  They had concerns that he might have a pneumonia versus CHF.  Patient was admitted April because of pneumonia.  Past Medical History:  Diagnosis Date  . Atrial fibrillation with RVR (HCC)    [name] 09/30/2015  . BPH (benign prostatic hypertrophy)    [name] 09/30/2015  . Constipation   . Edema   . Fracture of C6 vertebra, closed (HCC) 04/2017  . GERD (gastroesophageal reflux disease)   . Glaucoma   . Hypertension   . ICB (intracranial bleed) (HCC) 04/2017   with fall  . Mental retardation    [name] 09/30/2015  . Schizophrenia (HCC)   . Stroke (HCC)   . Type II diabetes mellitus (HCC)    [name] 09/30/2015  . Vitamin D deficiency     Patient Active Problem List   Diagnosis Date Noted  . Chronic systolic CHF (congestive heart failure) (HCC) 11/30/2017  . Right lower lobe pneumonia (HCC) 11/30/2017  . Bradycardia 08/07/2017  . Chronic diastolic CHF (congestive heart failure) (HCC) 05/09/2017  . BPH (benign prostatic hyperplasia) 05/09/2017  . Subarachnoid bleed (HCC) 05/09/2017   . Traumatic closed fracture of C6 vertebra with minimal displacement, initial encounter (HCC) 05/08/2017  . Chronic anticoagulation 05/08/2017  . Hematoma of oral cavity 05/08/2017  . Fall   . Stroke-like symptom 01/13/2017  . NSVT (nonsustained ventricular tachycardia) (HCC) 10/05/2015  . Cardiomyopathy (HCC)   . Smoker 10/01/2015  . Schizophrenia (HCC)   . Diabetes mellitus type 2, controlled (HCC) 09/30/2015  . Atrial fibrillation (HCC) 09/30/2015  . Pain in the chest   . 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 Diabetes mellitus (HCC) 02/02/2013  . Dehydration 02/02/2013  . MENTAL RETARDATION 02/12/2009  . Essential hypertension 02/12/2009  . GERD 02/12/2009  . CONSTIPATION 02/12/2009    Past Surgical History:  Procedure Laterality Date  . CATARACT EXTRACTION Left 12/2002   [name] 01/12/2011  . CATARACT EXTRACTION W/ INTRAOCULAR LENS IMPLANT Right 01/2011   [name] 02/18/2011  . MULTIPLE TOOTH EXTRACTIONS          Home Medications    Prior to Admission medications   Medication Sig Start Date End Date Taking? Authorizing Provider  acetaminophen (TYLENOL) 325 MG tablet Take 325-650 mg by mouth every 6 (six) hours as needed for mild pain or moderate pain.    Yes [provider]  atorvastatin (LIPITOR) 10 MG tablet Take 10 mg by mouth at bedtime.    Yes [provider]  Bepotastine Besilate 1.5 % SOLN Place 1  drop into both eyes 2 (two) times daily.   Yes [provider]  brinzolamide (AZOPT) 1 % ophthalmic suspension Place 1 drop into both eyes 2 (two) times daily.   Yes [provider]  cholecalciferol (VITAMIN D) 1000 UNITS tablet Take 1,000 Units by mouth every morning.    Yes [provider]  finasteride (PROSCAR) 5 MG tablet Take 5 mg by mouth every evening.    Yes [provider]  furosemide (LASIX) 20 MG tablet Take 2 tablets (40 mg total) by mouth daily. 06/30/18  Yes Newman Nip, NP  insulin glargine (LANTUS) 100 UNIT/ML  injection Inject 0.05 mLs (5 Units total) into the skin at bedtime. Hold is FSBS is less than 75 10/07/15  Yes Lendell Caprice, Corinna L, MD  latanoprost (XALATAN) 0.005 % ophthalmic solution Place 1 drop into both eyes at bedtime.   Yes [provider]  losartan (COZAAR) 25 MG tablet Take 1 tablet (25 mg total) by mouth daily. Patient taking differently: Take 25 mg by mouth 2 (two) times daily.  08/11/17  Yes Lockamy, Marcial Pacas, DO  metoprolol tartrate (LOPRESSOR) 25 MG tablet Take 1 tablet (25 mg total) by mouth 2 (two) times daily. 06/30/18 09/28/18 Yes Newman Nip, NP  olopatadine (PATANOL) 0.1 % ophthalmic solution Place 1 drop into both eyes every morning.   Yes [provider]  omeprazole (PRILOSEC) 20 MG capsule Take 40 mg by mouth every morning.    Yes [provider]  Polyvinyl Alcohol-Povidone (REFRESH OP) Place 1 drop into both eyes 2 (two) times daily.   Yes [provider]  risperiDONE (RISPERDAL) 0.25 MG tablet Take 0.25 mg by mouth every morning.    Yes [provider]  risperiDONE (RISPERDAL) 2 MG tablet Take 2 mg by mouth at bedtime.   Yes [provider]  senna (SENOKOT) 8.6 MG TABS Take 1 tablet by mouth daily.    Yes [provider]  tamsulosin (FLOMAX) 0.4 MG CAPS capsule Take 1 capsule (0.4 mg total) by mouth daily. 10/22/16  Yes West, Emily, PA-C  timolol (BETIMOL) 0.5 % ophthalmic solution Place 1 drop into both eyes daily.   Yes [provider]    Family History Family History  Problem Relation Age of Onset  . Hypertension Mother     Social History Social History   Tobacco Use  . Smoking status: Current Every Day Smoker  . Smokeless tobacco: Never Used  Substance Use Topics  . Alcohol use: No  . Drug use: No     Allergies   Patient has no known allergies.   Review of Systems Review of Systems  Unable to perform ROS: Other  Patient has cognitive delay   Physical Exam Updated Vital  Signs BP 101/81 (BP Location: Right Arm)   Pulse 96   Temp (!) 97.5 F (36.4 C) (Oral)   Resp 14   SpO2 99%   Physical Exam  Constitutional: He appears well-developed.  HENT:  Head: Atraumatic.  Neck: Neck supple.  Cardiovascular:  Tachycardia with heart rate between 100-110, irregular rhythm  Pulmonary/Chest: Effort normal and breath sounds normal. Tachypnea noted. He has no decreased breath sounds. He has no wheezes. He has no rhonchi. He has no rales.  Abdominal: Soft.  Musculoskeletal:       Right lower leg: He exhibits edema.       Left lower leg: He exhibits edema.  3+ pitting edema in bilateral lower extremity  Neurological: He is alert.  Skin:  Skin is warm.  Nursing note and vitals reviewed.    ED Treatments / Results  Labs (all labs ordered are listed, but only abnormal results are displayed) Labs Reviewed  COMPREHENSIVE METABOLIC PANEL - Abnormal; Notable for the following components:      Result Value   Glucose, Bld 136 (*)    Creatinine, Ser 1.26 (*)    Calcium 8.7 (*)    Total Protein 5.5 (*)    Albumin 3.2 (*)    GFR calc non Af Amer 56 (*)    All other components within normal limits  CBC WITH DIFFERENTIAL/PLATELET - Abnormal; Notable for the following components:   Platelets 145 (*)    All other components within normal limits  URINALYSIS, ROUTINE W REFLEX MICROSCOPIC - Abnormal; Notable for the following components:   Color, Urine STRAW (*)    All other components within normal limits  I-STAT VENOUS BLOOD GAS, ED - Abnormal; Notable for the following components:   pO2, Ven 20.0 (*)    All other components within normal limits  CULTURE, BLOOD (ROUTINE X 2)  PROTIME-INR  I-STAT CG4 LACTIC ACID, ED    EKG None  Radiology Dg Chest 2 View  Result Date: 07/04/2018 CLINICAL DATA:  Short of breath, cough for a week EXAM: CHEST - 2 VIEW COMPARISON:  Chest x-ray of 07/04/2018 FINDINGS: On this erect view, there are bilateral pleural effusions present  with bibasilar atelectasis. On the lateral view there is some fluid noted within the fissures, and there is mild cardiomegaly present. This may indicate mild congestive heart failure. No focal infiltrate is seen. There are healed rib fractures present on the right. IMPRESSION: Cardiomegaly and small effusions with a small amount of fluid in the fissures suggesting mild CHF. No definite pneumonia. Electronically Signed   By: Dwyane Dee M.D.   On: 07/04/2018 11:22   Dg Chest 2 View  Result Date: 07/04/2018 CLINICAL DATA:  Shortness of breath and cough. EXAM: CHEST - 2 VIEW COMPARISON:  11/30/2017 and prior radiographs FINDINGS: Cardiomegaly and pulmonary vascular congestion noted. Bibasilar opacities are identified, question atelectasis versus airspace disease/pneumonia. Probable trace bilateral pleural effusions noted. No pneumothorax or acute bony abnormality. Remote rib fractures are again noted. IMPRESSION: Cardiomegaly with pulmonary vascular congestion and probable trace bilateral pleural effusions. Bibasilar opacities, question atelectasis versus airspace disease/pneumonia. Electronically Signed   By: Harmon Pier M.D.   On: 07/04/2018 09:50    Procedures Procedures (including critical care time)  Medications Ordered in ED Medications  furosemide (LASIX) injection 40 mg (has no administration in time range)     Initial Impression / Assessment and Plan / ED Course  I have reviewed the triage vital signs and the nursing notes.  Pertinent labs & imaging results that were available during my care of the patient were reviewed by me and considered in my medical decision making (see chart for details).     72 year old male comes in with chief complaint of shortness of breath and A. fib with RVR.  On exam patient is tachycardic, however his heart rate is between 100-110 during my evaluation.  Patient is afebrile, he is denying any cough, abdominal pain, nausea, vomiting and it seems like he was  on for his routine visit to the clinic when these abnormalities were noted.  Therefore I do not think there is an underlying infectious process going on, rather this is just decompensation of his existing A. fib.  It seems like patient was recently started on metoprolol  so there appears to be some effort from the part of cardiology team to get him properly rate controlled.  Additionally, patient is noted to have significant pitting edema.  It does not seem like he is overtly volume overloaded on the lung side, as I do not have significant rales on exam.  Patient however does appear to have about 20 pound weight gain, and so he will need some diuresing.  2:09 PM Spoke with the card master, who will have the cardiology team come and assess the patient for admission.  IV Lasix given.  Patient's heart rate now in the 90s to 100 range, we will not give any beta-blocker for now.  This patients CHA2DS2-VASc Score and unadjusted Ischemic Stroke Rate (% per year) is equal to 4.8 % stroke rate/year from a score of 4  Above score calculated as 1 point each if present [CHF, HTN, DM, Vascular=MI/PAD/Aortic Plaque, Age if 65-74, or Male] Above score calculated as 2 points each if present [Age > 75, or Stroke/TIA/TE]   Final Clinical Impressions(s) / ED Diagnoses   Final diagnoses:  Acute on chronic systolic congestive heart failure (HCC)  Atrial fibrillation, unspecified type [facility])    ED Discharge Orders    None        Derwood Kaplan, MD 07/04/18 1410

## 2018-07-04 NOTE — ED Notes (Signed)
Pt given lunch bag and 8oz water.

## 2018-07-04 NOTE — ED Notes (Signed)
Requesting something to eat-will check with provider

## 2018-07-04 NOTE — Progress Notes (Signed)
Paged by nurse. HR sustaining in 130s. Stop metoprolol. Start IV cardizem. Watch for bradycardia. ? LVEF (pending echo).

## 2018-07-05 ENCOUNTER — Ambulatory Visit (HOSPITAL_BASED_OUTPATIENT_CLINIC_OR_DEPARTMENT_OTHER): Payer: Medicare Other

## 2018-07-05 ENCOUNTER — Encounter (HOSPITAL_COMMUNITY): Payer: Self-pay | Admitting: Physician Assistant

## 2018-07-05 DIAGNOSIS — Z9841 Cataract extraction status, right eye: Secondary | ICD-10-CM | POA: Diagnosis not present

## 2018-07-05 DIAGNOSIS — F209 Schizophrenia, unspecified: Secondary | ICD-10-CM | POA: Diagnosis present

## 2018-07-05 DIAGNOSIS — I130 Hypertensive heart and chronic kidney disease with heart failure and stage 1 through stage 4 chronic kidney disease, or unspecified chronic kidney disease: Secondary | ICD-10-CM | POA: Diagnosis present

## 2018-07-05 DIAGNOSIS — I428 Other cardiomyopathies: Secondary | ICD-10-CM | POA: Diagnosis present

## 2018-07-05 DIAGNOSIS — I340 Nonrheumatic mitral (valve) insufficiency: Secondary | ICD-10-CM | POA: Diagnosis not present

## 2018-07-05 DIAGNOSIS — K219 Gastro-esophageal reflux disease without esophagitis: Secondary | ICD-10-CM | POA: Diagnosis present

## 2018-07-05 DIAGNOSIS — I5043 Acute on chronic combined systolic (congestive) and diastolic (congestive) heart failure: Secondary | ICD-10-CM | POA: Diagnosis present

## 2018-07-05 DIAGNOSIS — E1122 Type 2 diabetes mellitus with diabetic chronic kidney disease: Secondary | ICD-10-CM | POA: Diagnosis present

## 2018-07-05 DIAGNOSIS — I4891 Unspecified atrial fibrillation: Secondary | ICD-10-CM | POA: Diagnosis present

## 2018-07-05 DIAGNOSIS — F17210 Nicotine dependence, cigarettes, uncomplicated: Secondary | ICD-10-CM | POA: Diagnosis present

## 2018-07-05 DIAGNOSIS — N400 Enlarged prostate without lower urinary tract symptoms: Secondary | ICD-10-CM | POA: Diagnosis present

## 2018-07-05 DIAGNOSIS — Z8673 Personal history of transient ischemic attack (TIA), and cerebral infarction without residual deficits: Secondary | ICD-10-CM | POA: Diagnosis not present

## 2018-07-05 DIAGNOSIS — I5023 Acute on chronic systolic (congestive) heart failure: Secondary | ICD-10-CM | POA: Diagnosis present

## 2018-07-05 DIAGNOSIS — Z8249 Family history of ischemic heart disease and other diseases of the circulatory system: Secondary | ICD-10-CM | POA: Diagnosis not present

## 2018-07-05 DIAGNOSIS — H409 Unspecified glaucoma: Secondary | ICD-10-CM | POA: Diagnosis present

## 2018-07-05 DIAGNOSIS — Z794 Long term (current) use of insulin: Secondary | ICD-10-CM | POA: Diagnosis not present

## 2018-07-05 DIAGNOSIS — Z9842 Cataract extraction status, left eye: Secondary | ICD-10-CM | POA: Diagnosis not present

## 2018-07-05 DIAGNOSIS — N179 Acute kidney failure, unspecified: Secondary | ICD-10-CM | POA: Diagnosis not present

## 2018-07-05 DIAGNOSIS — I272 Pulmonary hypertension, unspecified: Secondary | ICD-10-CM | POA: Diagnosis present

## 2018-07-05 DIAGNOSIS — I4819 Other persistent atrial fibrillation: Secondary | ICD-10-CM

## 2018-07-05 DIAGNOSIS — N182 Chronic kidney disease, stage 2 (mild): Secondary | ICD-10-CM | POA: Diagnosis present

## 2018-07-05 DIAGNOSIS — E876 Hypokalemia: Secondary | ICD-10-CM | POA: Diagnosis not present

## 2018-07-05 DIAGNOSIS — F79 Unspecified intellectual disabilities: Secondary | ICD-10-CM | POA: Diagnosis present

## 2018-07-05 DIAGNOSIS — Z961 Presence of intraocular lens: Secondary | ICD-10-CM | POA: Diagnosis present

## 2018-07-05 LAB — BASIC METABOLIC PANEL
ANION GAP: 5 (ref 5–15)
BUN: 15 mg/dL (ref 8–23)
CO2: 30 mmol/L (ref 22–32)
Calcium: 8.4 mg/dL — ABNORMAL LOW (ref 8.9–10.3)
Chloride: 109 mmol/L (ref 98–111)
Creatinine, Ser: 1.36 mg/dL — ABNORMAL HIGH (ref 0.61–1.24)
GFR calc non Af Amer: 51 mL/min — ABNORMAL LOW (ref >60)
GFR, EST AFRICAN AMERICAN: 59 mL/min — AB (ref >60)
Glucose, Bld: 102 mg/dL — ABNORMAL HIGH (ref 70–99)
POTASSIUM: 3.4 mmol/L — AB (ref 3.5–5.1)
SODIUM: 144 mmol/L (ref 135–145)

## 2018-07-05 LAB — GLUCOSE, CAPILLARY
GLUCOSE-CAPILLARY: 95 mg/dL (ref 70–99)
GLUCOSE-CAPILLARY: 176 mg/dL — AB (ref 70–99)
Glucose-Capillary: 129 mg/dL — ABNORMAL HIGH (ref 70–99)

## 2018-07-05 LAB — CBC
HCT: 40.9 % (ref 39.0–52.0)
HEMOGLOBIN: 13.3 g/dL (ref 13.0–17.0)
MCH: 29.1 pg (ref 26.0–34.0)
MCHC: 32.5 g/dL (ref 30.0–36.0)
MCV: 89.5 fL (ref 80.0–100.0)
NRBC: 0 % (ref 0.0–0.2)
Platelets: 155 10*3/uL (ref 150–400)
RBC: 4.57 MIL/uL (ref 4.22–5.81)
RDW: 15.4 % (ref 11.5–15.5)
WBC: 4.5 10*3/uL (ref 4.0–10.5)

## 2018-07-05 LAB — ECHOCARDIOGRAM COMPLETE
Height: 69 in
Weight: 3120 oz

## 2018-07-05 LAB — POTASSIUM: POTASSIUM: 4 mmol/L (ref 3.5–5.1)

## 2018-07-05 MED ORDER — POTASSIUM CHLORIDE CRYS ER 20 MEQ PO TBCR
40.0000 meq | EXTENDED_RELEASE_TABLET | Freq: Two times a day (BID) | ORAL | Status: DC
  Administered 2018-07-05 – 2018-07-06 (×4): 40 meq via ORAL
  Filled 2018-07-05 (×5): qty 2

## 2018-07-05 NOTE — Progress Notes (Signed)
  Echocardiogram 2D Echocardiogram has been performed.  Gerda Diss 07/05/2018, 12:39 PM

## 2018-07-05 NOTE — Progress Notes (Signed)
Pt had 5 beat run of VT as notified by central monitoring.  Cardiology NP notified via text page.

## 2018-07-05 NOTE — Progress Notes (Signed)
Pt having episodes of dyspnea at rest, on room air Sp02 down to 86% briefly then comes back up to 96%.  HR 110s-110s.  Although Sp02 comes back up to WNL, pt placed on 2L O2 for comfort and to help reduce symptoms of dyspnea.  He now appears to be resting comfortably at this time.

## 2018-07-05 NOTE — Clinical Social Work Note (Signed)
Clinical Social Work Assessment  Patient Details  Name: Jordan Rowe MRN: [id number] Date of Birth: June 14, 1946  Date of referral:  07/05/18               Reason for consult:  Facility Placement, Discharge Planning                Permission sought to share information with:  Facility Medical sales representative, Family Supports Permission granted to share information::     Name::     Jordan Rowe   Agency::  [facility]   Relationship::  aunt  Contact Information:  [phone]  Housing/Transportation Living arrangements for the past 2 months:  Assisted Living Facility Source of Information:  Facility, Other (Comment Required)(aunt) Patient Interpreter Needed:  None Criminal Activity/Legal Involvement Pertinent to Current Situation/Hospitalization:  No - Comment as needed Significant Relationships:  Siblings, Other Family Members Lives with:  Facility Resident Do you feel safe going back to the place where you live?  Yes Need for family participation in patient care:  Yes (Comment)  Care giving concerns: Patient from [facility].    Social Worker assessment / plan: CSW spoke to patient's aunt briefly on the phone, who confirmed that patient lives at [facility]. CSW also spoke to staff at [facility] who confirmed patient can return when medically ready. They will need a signed FL2 with discharge meds and home health orders if applicable.  CSW did note from a previous admission in December 2018, patient's brother, Jordan Rowe, was involved in discharge planning. However, the phone number listed from that encounter [phone]) is no longer a working number.  CSW to follow for patient's medical readiness and support with discharge planning.  Employment status:  Retired, Disabled (Comment on whether or not currently receiving Disability) Insurance information:  Medicare PT Recommendations:  Not assessed at this time Information / Referral to community resources:      Patient/Family's Response to care: Not discussed.  Patient/Family's Understanding of and Emotional Response to Diagnosis, Current Treatment, and Prognosis: Not discussed. Plan for patient to return to ALF.  Emotional Assessment Appearance:  Appears stated age Attitude/Demeanor/Rapport:  Unable to Assess Affect (typically observed):  Unable to Assess Orientation:  Oriented to Self, Oriented to Place, Oriented to  Time, Oriented to Situation Alcohol / Substance use:  Not Applicable Psych involvement (Current and /or in the community):  No (Comment)  Discharge Needs  Concerns to be addressed:  Discharge Planning Concerns, Care Coordination Readmission within the last 30 days:  No Current discharge risk:  Cognitively Impaired Barriers to Discharge:  Continued Medical Work up   Abigail Butts, LCSW 07/05/2018, 2:00 PM

## 2018-07-05 NOTE — Care Management Obs Status (Signed)
MEDICARE OBSERVATION STATUS NOTIFICATION   Patient Details  Name: Cantrell O[facility]ner MRN: [id number] Date of Birth: [birth year]   Medicare Observation Status Notification Given:  Yes    Gala Lewandowsky, RN 07/05/2018, 3:32 PM

## 2018-07-05 NOTE — Progress Notes (Addendum)
Progress Note  Patient Name: Jordan Rowe Date of Encounter: 07/05/2018  Primary Cardiologist:  Nicki Guadalajara, MD  Subjective   Pt denies chest pain or SOB, wants to know if he can go home.   Inpatient Medications    Scheduled Meds: . atorvastatin  10 mg Oral QHS  . brinzolamide  1 drop Both Eyes BID  . cholecalciferol  1,000 Units Oral q morning - 10a  . finasteride  5 mg Oral QPM  . furosemide  40 mg Intravenous BID  . heparin  5,000 Units Subcutaneous Q8H  . insulin aspart  0-15 Units Subcutaneous TID WC  . latanoprost  1 drop Both Eyes QHS  . losartan  25 mg Oral BID  . metoprolol tartrate  50 mg Oral BID  . olopatadine  1 drop Both Eyes BID  . pantoprazole  40 mg Oral Daily  . potassium chloride  10 mEq Oral BID  . risperiDONE  0.25 mg Oral [id number]  . risperiDONE  2 mg Oral QHS  . senna  1 tablet Oral Daily  . tamsulosin  0.4 mg Oral Daily  . timolol  1 drop Both Eyes Daily   Continuous Infusions:  PRN Meds: acetaminophen, nitroGLYCERIN, ondansetron (ZOFRAN) IV   Vital Signs    Vitals:   07/04/18 1500 07/04/18 1646 07/04/18 2007 07/05/18 0517  BP: 98/80 107/90 (!) 121/93 116/88  Pulse: (!) 106 (!) 107 73 70  Resp: 13 16 18 16   Temp:  97.7 F (36.5 C)  97.9 F (36.6 C)  TempSrc:  Oral  Oral  SpO2: 95% 96% 99% 92%    Intake/Output Summary (Last 24 hours) at 07/05/2018 0734 Last data filed at 07/04/2018 2123 Gross per 24 hour  Intake 460 ml  Output 2900 ml  Net -2440 ml   There were no vitals filed for this visit.  Telemetry    Atrial fib, generally RVR but rate better this am - Personally Reviewed  ECG    None today - Personally Reviewed  Physical Exam   General: Well developed, well nourished, male appearing in no acute distress. Head: Normocephalic, atraumatic.  Neck: Supple without bruits, JVD 9 cm. Lungs:  Resp regular and unlabored, slightly decreased BS bases w/ few rales. Heart: Irreg R&R, S1, S2, no S3, S4, or murmur; no  rub. Abdomen: Soft, non-tender, non-distended with normoactive bowel sounds. No hepatomegaly. No rebound/guarding. No obvious abdominal masses. Extremities: No clubbing, cyanosis, LLE 1+ edema, mostly below the calf, none on RLE. Distal pedal pulses are 2+ bilaterally. Neuro: Alert and oriented X 2. Moves all extremities spontaneously. Psych: Normal affect.  Labs    Hematology Recent Labs  Lab 06/30/18 1129 07/04/18 1140 07/05/18 0351  WBC 4.3 5.1 4.5  RBC 4.86 5.10 4.57  HGB 14.3 14.7 13.3  HCT 44.4 47.4 40.9  MCV 91.4 92.9 89.5  MCH 29.4 28.8 29.1  MCHC 32.2 31.0 32.5  RDW 15.3 15.5 15.4  PLT 170 145* 155    Chemistry Recent Labs  Lab 06/30/18 1129 07/04/18 0904 07/04/18 1140 07/05/18 0351  NA 141 143 143 144  K 3.8 3.4* 3.7 3.4*  CL 106 107 108 109  CO2 27 28 25 30   GLUCOSE 131* 145* 136* 102*  BUN 12 15 15 15   CREATININE 1.28* 1.27* 1.26* 1.36*  CALCIUM 8.7* 8.6* 8.7* 8.4*  PROT 5.7*  --  5.5*  --   ALBUMIN 3.2*  --  3.2*  --   AST 15  --  17  --  ALT 26  --  28  --   ALKPHOS 63  --  63  --   BILITOT 0.9  --  0.8  --   GFRNONAA 55* 56* 56* 51*  GFRAA >60 >60 >60 59*  ANIONGAP 8 8 10 5      BNP Recent Labs  Lab 07/04/18 1140  BNP 945.8*      Radiology    Dg Chest 2 View  Result Date: 07/04/2018 CLINICAL DATA:  Short of breath, cough for a week EXAM: CHEST - 2 VIEW COMPARISON:  Chest x-ray of 07/04/2018 FINDINGS: On this erect view, there are bilateral pleural effusions present with bibasilar atelectasis. On the lateral view there is some fluid noted within the fissures, and there is mild cardiomegaly present. This may indicate mild congestive heart failure. No focal infiltrate is seen. There are healed rib fractures present on the right. IMPRESSION: Cardiomegaly and small effusions with a small amount of fluid in the fissures suggesting mild CHF. No definite pneumonia. Electronically Signed   By: Dwyane Dee M.D.   On: 07/04/2018 11:22   Dg Chest 2  View  Result Date: 07/04/2018 CLINICAL DATA:  Shortness of breath and cough. EXAM: CHEST - 2 VIEW COMPARISON:  11/30/2017 and prior radiographs FINDINGS: Cardiomegaly and pulmonary vascular congestion noted. Bibasilar opacities are identified, question atelectasis versus airspace disease/pneumonia. Probable trace bilateral pleural effusions noted. No pneumothorax or acute bony abnormality. Remote rib fractures are again noted. IMPRESSION: Cardiomegaly with pulmonary vascular congestion and probable trace bilateral pleural effusions. Bibasilar opacities, question atelectasis versus airspace disease/pneumonia. Electronically Signed   By: Harmon Pier M.D.   On: 07/04/2018 09:50     Cardiac Studies   ECHO:  Ordered, EF previously 45-50% w/ grade 2 dd  Patient Profile     72 y.o. male w/ hx schizophrenia, mental retardation, DM, CVA, S-D-CHF, PAF, fall w/ SAH and C6 fx so no anticoag, was admitted 11/05 for rapid afib.  Assessment & Plan     Principal Problem:   Persistent atrial fibrillation with rapid ventricular response - IV Cardizem used>>transition to metoprolol 50 mg bid (pta 25 mg bid) - not sure BP will  Tolerate a higher dose - follow HR today, could increase to 75 mg bid if better HR control needed  Active Problems:   Acute on chronic combined systolic and diastolic CHF (congestive heart failure) (HCC) - echo ordered, EF prev 45-50%, MV prev neg, felt NICM - he is on BB and losartan 25 mg, home dose - lasix was 40 mg qd - should not require much more diuresis, no obvious SOB, mild volume overload on exam - would benefit from hydralazine and nitrates, may be able to get low dose Imdur on board, discuss w/ MD    Hypokalemia - supplement and follow    Anticoagulation - CHA2DS2-VASc = 6 (age x 1, CHF, HTN, DM, CVA x 2) - However, fall 04/2018 w/ C6 fx and SAH>>anticoag d/c'd    SignedTheodore Demark , PA-C 7:34 AM 07/05/2018 Pager: [phone]  I have examined the  patient and reviewed assessment and plan and discussed with patient.  Agree with above as stated.    LE edema is better.  Check on rate control later in the day.  Hopefully, can discharge tomorrow- if HR controlled, possibly later this evening.  May need to increase rate control meds before starting hydralazine and nitrates.    Low salt diet.  Not a candidate for anticoagulation due to prior ICH.  Larae Grooms

## 2018-07-06 LAB — BASIC METABOLIC PANEL
ANION GAP: 11 (ref 5–15)
BUN: 15 mg/dL (ref 8–23)
CALCIUM: 8.8 mg/dL — AB (ref 8.9–10.3)
CO2: 23 mmol/L (ref 22–32)
CREATININE: 1.6 mg/dL — AB (ref 0.61–1.24)
Chloride: 106 mmol/L (ref 98–111)
GFR, EST AFRICAN AMERICAN: 49 mL/min — AB (ref >60)
GFR, EST NON AFRICAN AMERICAN: 42 mL/min — AB (ref >60)
Glucose, Bld: 190 mg/dL — ABNORMAL HIGH (ref 70–99)
Potassium: 4.2 mmol/L (ref 3.5–5.1)
Sodium: 140 mmol/L (ref 135–145)

## 2018-07-06 LAB — GLUCOSE, CAPILLARY
GLUCOSE-CAPILLARY: 124 mg/dL — AB (ref 70–99)
GLUCOSE-CAPILLARY: 107 mg/dL — AB (ref 70–99)
GLUCOSE-CAPILLARY: 185 mg/dL — AB (ref 70–99)
Glucose-Capillary: 181 mg/dL — ABNORMAL HIGH (ref 70–99)
Glucose-Capillary: 154 mg/dL — ABNORMAL HIGH (ref 70–99)

## 2018-07-06 LAB — MRSA PCR SCREENING: MRSA BY PCR: NEGATIVE

## 2018-07-06 MED ORDER — AMIODARONE LOAD VIA INFUSION
150.0000 mg | Freq: Once | INTRAVENOUS | Status: AC
  Administered 2018-07-06: 150 mg via INTRAVENOUS
  Filled 2018-07-06: qty 83.34

## 2018-07-06 MED ORDER — AMIODARONE HCL 200 MG PO TABS
400.0000 mg | ORAL_TABLET | Freq: Two times a day (BID) | ORAL | Status: DC
  Administered 2018-07-06 – 2018-07-07 (×2): 400 mg via ORAL
  Filled 2018-07-06 (×2): qty 2

## 2018-07-06 MED ORDER — AMIODARONE HCL IN DEXTROSE 360-4.14 MG/200ML-% IV SOLN
30.0000 mg/h | INTRAVENOUS | Status: AC
  Administered 2018-07-06 (×2): 30 mg/h via INTRAVENOUS
  Filled 2018-07-06 (×3): qty 200

## 2018-07-06 MED ORDER — FUROSEMIDE 40 MG PO TABS
40.0000 mg | ORAL_TABLET | Freq: Two times a day (BID) | ORAL | Status: DC
  Administered 2018-07-06: 40 mg via ORAL
  Filled 2018-07-06 (×2): qty 1

## 2018-07-06 MED ORDER — AMIODARONE HCL IN DEXTROSE 360-4.14 MG/200ML-% IV SOLN
60.0000 mg/h | INTRAVENOUS | Status: AC
  Administered 2018-07-06 (×2): 60 mg/h via INTRAVENOUS

## 2018-07-06 NOTE — Progress Notes (Addendum)
Progress Note  Patient Name: Jordan Rowe Date of Encounter: 07/06/2018  Primary Cardiologist:  Nicki Guadalajara, MD  Subjective   No awareness of Afib, denies CP/SOB, wants to go home  Inpatient Medications    Scheduled Meds: . amiodarone  150 mg Intravenous Once  . atorvastatin  10 mg Oral QHS  . brinzolamide  1 drop Both Eyes BID  . cholecalciferol  1,000 Units Oral q morning - 10a  . finasteride  5 mg Oral QPM  . furosemide  40 mg Intravenous BID  . heparin  5,000 Units Subcutaneous Q8H  . insulin aspart  0-15 Units Subcutaneous TID WC  . latanoprost  1 drop Both Eyes QHS  . losartan  25 mg Oral BID  . metoprolol tartrate  50 mg Oral BID  . olopatadine  1 drop Both Eyes BID  . pantoprazole  40 mg Oral Daily  . potassium chloride  40 mEq Oral BID  . risperiDONE  0.25 mg Oral BH-q7a  . risperiDONE  2 mg Oral QHS  . senna  1 tablet Oral Daily  . tamsulosin  0.4 mg Oral Daily  . timolol  1 drop Both Eyes Daily   Continuous Infusions: . amiodarone     Followed by  . amiodarone     PRN Meds: acetaminophen, nitroGLYCERIN, ondansetron (ZOFRAN) IV   Vital Signs    Vitals:   07/05/18 1644 07/05/18 2125 07/06/18 0309 07/06/18 0830  BP: 102/84 110/87 (!) 112/94 113/82  Pulse: (!) 102 (!) 102 (!) 110 (!) 115  Resp: (!) 32 (!) 25 (!) 27   Temp: 98.1 F (36.7 C) 98.2 F (36.8 C) 98 F (36.7 C)   TempSrc: Oral Oral Oral   SpO2: 91% 92% 93%   Weight:   84.9 kg     Intake/Output Summary (Last 24 hours) at 07/06/2018 4166 Last data filed at 07/06/2018 0600 Gross per 24 hour  Intake 752 ml  Output 2450 ml  Net -1698 ml   Filed Weights   07/06/18 0309  Weight: 84.9 kg    Telemetry    Atrial fib, RVR -  Personally Reviewed  ECG    None today - Personally Reviewed  Physical Exam   General: Frail, elderly, male in no acute distress Head: Eyes PERRLA, No xanthomas.   Normocephalic and atraumatic Lungs: decreased BS bases w/ a few rales Heart: Irreg R&R S1  S2, without MRG.  Pulses are 2+ & equal. Minimal JVD. Abdomen: Bowel sounds are present, abdomen soft and non-tender without masses or  hernias noted. Msk: Normal strength and tone for age. Extremities: No clubbing, cyanosis or edema.    Skin:  No rashes or lesions noted. Neuro: Alert and oriented X 2. Psych:  Good affect, responds appropriately   Labs    Hematology Recent Labs  Lab 06/30/18 1129 07/04/18 1140 07/05/18 0351  WBC 4.3 5.1 4.5  RBC 4.86 5.10 4.57  HGB 14.3 14.7 13.3  HCT 44.4 47.4 40.9  MCV 91.4 92.9 89.5  MCH 29.4 28.8 29.1  MCHC 32.2 31.0 32.5  RDW 15.3 15.5 15.4  PLT 170 145* 155    Chemistry Recent Labs  Lab 06/30/18 1129 07/04/18 0904 07/04/18 1140 07/05/18 0351 07/05/18 1437  NA 141 143 143 144  --   K 3.8 3.4* 3.7 3.4* 4.0  CL 106 107 108 109  --   CO2 27 28 25 30   --   GLUCOSE 131* 145* 136* 102*  --   BUN 12 15  15 15  --   CREATININE 1.28* 1.27* 1.26* 1.36*  --   CALCIUM 8.7* 8.6* 8.7* 8.4*  --   PROT 5.7*  --  5.5*  --   --   ALBUMIN 3.2*  --  3.2*  --   --   AST 15  --  17  --   --   ALT 26  --  28  --   --   ALKPHOS 63  --  63  --   --   BILITOT 0.9  --  0.8  --   --   GFRNONAA 55* 56* 56* 51*  --   GFRAA >60 >60 >60 59*  --   ANIONGAP 8 8 10 5   --      BNP Recent Labs  Lab 07/04/18 1140  BNP 945.8*      Radiology    Dg Chest 2 View  Result Date: 07/04/2018 CLINICAL DATA:  Short of breath, cough for a week EXAM: CHEST - 2 VIEW COMPARISON:  Chest x-ray of 07/04/2018 FINDINGS: On this erect view, there are bilateral pleural effusions present with bibasilar atelectasis. On the lateral view there is some fluid noted within the fissures, and there is mild cardiomegaly present. This may indicate mild congestive heart failure. No focal infiltrate is seen. There are healed rib fractures present on the right. IMPRESSION: Cardiomegaly and small effusions with a small amount of fluid in the fissures suggesting mild CHF. No definite  pneumonia. Electronically Signed   By: Dwyane Dee M.D.   On: 07/04/2018 11:22   Dg Chest 2 View  Result Date: 07/04/2018 CLINICAL DATA:  Shortness of breath and cough. EXAM: CHEST - 2 VIEW COMPARISON:  11/30/2017 and prior radiographs FINDINGS: Cardiomegaly and pulmonary vascular congestion noted. Bibasilar opacities are identified, question atelectasis versus airspace disease/pneumonia. Probable trace bilateral pleural effusions noted. No pneumothorax or acute bony abnormality. Remote rib fractures are again noted. IMPRESSION: Cardiomegaly with pulmonary vascular congestion and probable trace bilateral pleural effusions. Bibasilar opacities, question atelectasis versus airspace disease/pneumonia. Electronically Signed   By: Harmon Pier M.D.   On: 07/04/2018 09:50     Cardiac Studies   ECHO:  EF previously 45-50% w/ grade 2 dd 07/05/2018 - Left ventricle: The cavity size was mildly dilated. Wall   thickness was normal. The estimated ejection fraction was 15%.   Diffuse hypokinesis. The study was not technically sufficient to   allow evaluation of LV diastolic dysfunction due to atrial   fibrillation. - Aortic valve: There was no stenosis. - Mitral valve: Mildly calcified annulus. There was mild   regurgitation. - Left atrium: The atrium was mildly dilated. - Right ventricle: The cavity size was normal. Systolic function   was moderately reduced. - Right atrium: The atrium was mildly dilated. - Tricuspid valve: Peak RV-RA gradient (S): 25 mm Hg. - Pulmonary arteries: PA peak pressure: 40 mm Hg (S). - Systemic veins: IVC measured 2.9 cm with < 50% respirophasic   variation, suggesting RA pressure 15 mmHg.  Impressions:  - The patient was in atrial fibrillation. Mildly dilated LV with EF   15%, diffuse hypokinesis. Normal RV size with moderately   decreased systolic function. Mild pulmonary hypertension. Dilated   IVC suggestive of elevated RV filling pressure.  Patient Profile      72 y.o. male w/ hx schizophrenia, mental retardation, DM, CVA, S-D-CHF, PAF, fall w/ SAH and C6 fx so no anticoag despite CHA2DS2-VASc = 6 (age x 1, CHF, HTN, DM, CVA  x 2). Pt admitted 11/05 for rapid afib and CHF  Assessment & Plan     Principal Problem:   Persistent atrial fibrillation with rapid ventricular response - now off Cardizem due to decreased EF - metoprolol increased 25>>50 mg bid - BP limits further titration - Case reviewed by Dr Eldridge Dace and L. Vassie Loll, PharmD>>amio for rate control  Active Problems:   Acute on chronic combined systolic and diastolic CHF (congestive heart failure) (HCC) - echo prev 45050% but now 15%, prev felt NICM - may be tachycardia mediated but cannot cardiovert since cannot anticoagulate - continue BB/ARB - PAS mildly elevated on echo, but feel volume is improved - consider change to po Lasix, and increase dose from 40 mg qd to 40 mg bid. OR Lasix 40 mg qd and Spiro 12.5-25 mg qd - would benefit from hydralazine, nitrates or Entresto, but need to get HR stabilized to see if BP will tolerate    Hypokalemia - s/p supplement and improved - decreased supplement if Lasix dose changes - follow, today check pending    Anticoagulation - high fall risk and hx SAH - no anticoagulation as outpt - subcu heparin here   SignedTheodore Demark , PA-C 9:29 AM 07/06/2018 Pager: [phone]  I have examined the patient and reviewed assessment and plan and discussed with patient.  Agree with above as stated.  EF has dropped dramatically.  Will start IV Amio for rate control. Not a candidate for anticoagulation.  Change to oral Lasix.  May need to decrease losartan if we need more BP cushion for additional rate control.   CRI: will recheck BMet.   Lance Muss

## 2018-07-06 NOTE — NC FL2 (Signed)
[place] MEDICAID FL2 LEVEL OF CARE SCREENING TOOL     IDENTIFICATION  Patient Name: Jordan Rowe Birthdate: 16-Sep-1945 Sex: male Admission Date (Current Location): 07/04/2018  [facility] and [place] Number:  Producer, television/film/video and Address:  [facility] [place], [address] [place], [place] [address]      Provider Number: [id number]  Attending Physician Name and Address:  Corky Crafts, MD  Relative Name and Phone Number:  [name], Celine Ahr [phone]); Peri Maris, sister [phone])    Current Level of Care: Hospital Recommended Level of Care: Assisted Living Facility Prior Approval Number:    Date Approved/Denied:   PASRR Number:    Discharge Plan: Other (Comment)(Assisted Living Facility)    Current Diagnoses: Patient Active Problem List   Diagnosis Date Noted  . Acute on chronic combined systolic and diastolic CHF (congestive heart failure) (HCC) 07/05/2018  . A-fib (HCC) 07/05/2018  . Persistent atrial fibrillation with rapid ventricular response 07/04/2018  . Chronic systolic CHF (congestive heart failure) (HCC) 11/30/2017  . Right lower lobe pneumonia (HCC) 11/30/2017  . Bradycardia 08/07/2017  . Chronic diastolic CHF (congestive heart failure) (HCC) 05/09/2017  . BPH (benign prostatic hyperplasia) 05/09/2017  . Subarachnoid bleed (HCC) 05/09/2017  . Traumatic closed fracture of C6 vertebra with minimal displacement, initial encounter (HCC) 05/08/2017  . Chronic anticoagulation 05/08/2017  . Hematoma of oral cavity 05/08/2017  . Fall   . Stroke-like symptom 01/13/2017  . NSVT (nonsustained ventricular tachycardia) (HCC) 10/05/2015  . Cardiomyopathy (HCC)   . Smoker 10/01/2015  . Schizophrenia (HCC)   . Diabetes mellitus type 2, controlled (HCC) 09/30/2015  . Atrial fibrillation (HCC) 09/30/2015  . Pain in the chest   . 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 Diabetes mellitus (HCC) 02/02/2013  . Dehydration 02/02/2013  .  MENTAL RETARDATION 02/12/2009  . Essential hypertension 02/12/2009  . GERD 02/12/2009  . CONSTIPATION 02/12/2009    Orientation RESPIRATION BLADDER Height & Weight     Self, Time, Situation, Place  Normal External catheter, Incontinent Weight: 84.9 kg Height:     BEHAVIORAL SYMPTOMS/MOOD NEUROLOGICAL BOWEL NUTRITION STATUS      Continent Diet(heart healthy)  AMBULATORY STATUS COMMUNICATION OF NEEDS Skin   (baseline) Verbally Normal                       Personal Care Assistance Level of Assistance  Bathing, Feeding, Dressing Bathing Assistance: (baseline) Feeding assistance: Independent Dressing Assistance: (baseline)     Functional Limitations Info  Sight, Hearing, Speech Sight Info: Adequate Hearing Info: Adequate Speech Info: Adequate    SPECIAL CARE FACTORS FREQUENCY                       Contractures Contractures Info: Not present    Additional Factors Info  Code Status, Allergies, Psychotropic, Insulin Sliding Scale Code Status Info: Full Code Allergies Info: No Known Allergies Psychotropic Info: Risperdal- .25mg  AM; 2mg  bedtime  Insulin Sliding Scale Info: 3x daily with meals       Current Medications (07/06/2018):  This is the current hospital active medication list Current Facility-Administered Medications  Medication Dose Route Frequency Provider Last Rate Last Dose  . acetaminophen (TYLENOL) tablet 650 mg  650 mg Oral Q4H PRN Bhagat, Bhavinkumar, PA      . amiodarone (NEXTERONE PREMIX) 360-4.14 MG/200ML-% (1.8 mg/mL) IV infusion  60 mg/hr Intravenous Continuous Corky Crafts, MD 33.3 mL/hr at 07/06/18 0953 60 mg/hr at 07/06/18 [phone]  Followed by  . amiodarone (NEXTERONE PREMIX) 360-4.14 MG/200ML-% (1.8 mg/mL) IV infusion  30 mg/hr Intravenous Continuous Lance Muss S, MD      . atorvastatin (LIPITOR) tablet 10 mg  10 mg Oral QHS Bhagat, Bhavinkumar, PA   10 mg at 07/05/18 2033  . brinzolamide (AZOPT) 1 % ophthalmic suspension 1  drop  1 drop Both Eyes BID Bhagat, Bhavinkumar, PA   1 drop at 07/06/18 0835  . cholecalciferol (VITAMIN D) tablet 1,000 Units  1,000 Units Oral q morning - 10a Bhagat, Bhavinkumar, PA   1,000 Units at 07/06/18 0831  . finasteride (PROSCAR) tablet 5 mg  5 mg Oral QPM Bhagat, Bhavinkumar, PA   5 mg at 07/05/18 1644  . furosemide (LASIX) tablet 40 mg  40 mg Oral BID Corky Crafts, MD      . heparin injection 5,000 Units  5,000 Units Subcutaneous Q8H Bhagat, Bhavinkumar, PA   5,000 Units at 07/06/18 1660  . insulin aspart (novoLOG) injection 0-15 Units  0-15 Units Subcutaneous TID WC Bhagat, Bhavinkumar, PA   2 Units at 07/05/18 1643  . latanoprost (XALATAN) 0.005 % ophthalmic solution 1 drop  1 drop Both Eyes QHS Bhagat, Bhavinkumar, PA   1 drop at 07/05/18 2029  . losartan (COZAAR) tablet 25 mg  25 mg Oral BID Bhagat, Bhavinkumar, PA   25 mg at 07/06/18 0830  . metoprolol tartrate (LOPRESSOR) tablet 50 mg  50 mg Oral BID Bhagat, Bhavinkumar, PA   50 mg at 07/06/18 0830  . nitroGLYCERIN (NITROSTAT) SL tablet 0.4 mg  0.4 mg Sublingual Q5 Min x 3 PRN Bhagat, Bhavinkumar, PA      . olopatadine (PATANOL) 0.1 % ophthalmic solution 1 drop  1 drop Both Eyes BID Bhagat, Bhavinkumar, PA   1 drop at 07/06/18 0834  . ondansetron (ZOFRAN) injection 4 mg  4 mg Intravenous Q6H PRN Bhagat, Bhavinkumar, PA      . pantoprazole (PROTONIX) EC tablet 40 mg  40 mg Oral Daily Bhagat, Bhavinkumar, PA   40 mg at 07/06/18 0831  . potassium chloride SA (K-DUR,KLOR-CON) CR tablet 40 mEq  40 mEq Oral BID Barrett, Rhonda G, PA-C   40 mEq at 07/06/18 0840  . risperiDONE (RISPERDAL) tablet 0.25 mg  0.25 mg Oral BH-q7a Bhagat, Bhavinkumar, PA   0.25 mg at 07/06/18 0609  . risperiDONE (RISPERDAL) tablet 2 mg  2 mg Oral QHS Bhagat, Bhavinkumar, PA   2 mg at 07/05/18 2033  . senna (SENOKOT) tablet 8.6 mg  1 tablet Oral Daily Bhagat, Bhavinkumar, PA   8.6 mg at 07/06/18 0831  . tamsulosin (FLOMAX) capsule 0.4 mg  0.4 mg Oral  Daily Bhagat, Bhavinkumar, PA   0.4 mg at 07/06/18 0830  . timolol (TIMOPTIC) 0.5 % ophthalmic solution 1 drop  1 drop Both Eyes Daily Bhagat, Bhavinkumar, PA   1 drop at 07/06/18 0836     Discharge Medications: Please see discharge summary for a list of discharge medications.  Relevant Imaging Results:  Relevant Lab Results:   Additional Information SSN: [id number]  Abigail Butts, LCSW

## 2018-07-07 ENCOUNTER — Other Ambulatory Visit: Payer: Self-pay | Admitting: Cardiology

## 2018-07-07 DIAGNOSIS — N179 Acute kidney failure, unspecified: Secondary | ICD-10-CM

## 2018-07-07 LAB — BASIC METABOLIC PANEL
ANION GAP: 8 (ref 5–15)
BUN: 21 mg/dL (ref 8–23)
CALCIUM: 9 mg/dL (ref 8.9–10.3)
CHLORIDE: 109 mmol/L (ref 98–111)
CO2: 24 mmol/L (ref 22–32)
Creatinine, Ser: 1.76 mg/dL — ABNORMAL HIGH (ref 0.61–1.24)
GFR calc non Af Amer: 37 mL/min — ABNORMAL LOW (ref >60)
GFR, EST AFRICAN AMERICAN: 43 mL/min — AB (ref >60)
GLUCOSE: 151 mg/dL — AB (ref 70–99)
Potassium: 4.9 mmol/L (ref 3.5–5.1)
Sodium: 141 mmol/L (ref 135–145)

## 2018-07-07 LAB — GLUCOSE, CAPILLARY
GLUCOSE-CAPILLARY: 105 mg/dL — AB (ref 70–99)
Glucose-Capillary: 113 mg/dL — ABNORMAL HIGH (ref 70–99)

## 2018-07-07 MED ORDER — AMIODARONE HCL 400 MG PO TABS: 400.0000 mg | ORAL_TABLET | Freq: Two times a day (BID) | ORAL | 0 refills | Status: DC

## 2018-07-07 MED ORDER — METOPROLOL TARTRATE 50 MG PO TABS: 50.0000 mg | ORAL_TABLET | Freq: Two times a day (BID) | ORAL | 3 refills | Status: AC

## 2018-07-07 MED ORDER — AMIODARONE HCL 400 MG PO TABS: 400.0000 mg | ORAL_TABLET | Freq: Two times a day (BID) | ORAL | 1 refills | Status: AC

## 2018-07-07 MED ORDER — METOPROLOL TARTRATE 50 MG PO TABS: 50.0000 mg | ORAL_TABLET | Freq: Two times a day (BID) | ORAL | 3 refills | Status: DC

## 2018-07-07 NOTE — Progress Notes (Signed)
Patient will discharge back to [facility]. Anticipated discharge date: 07/07/18 Family notified: [name], aunt Transportation by: PTAR  Nurse to call report to [phone].  CSW signing off.  Abigail Butts, LCSWA  Clinical Social Worker

## 2018-07-07 NOTE — Progress Notes (Addendum)
Progress Note  Patient Name: Jordan Rowe Date of Encounter: 07/07/2018 Primary Cardiologist: Nicki Guadalajara, MD   Subjective   She resting in bed.  He denies any chest discomfort, palpitations or shortness of breath.  He is concerned about finding his wallet.  Inpatient Medications    Scheduled Meds: . amiodarone  400 mg Oral BID  . atorvastatin  10 mg Oral QHS  . brinzolamide  1 drop Both Eyes BID  . cholecalciferol  1,000 Units Oral q morning - 10a  . finasteride  5 mg Oral QPM  . furosemide  40 mg Oral BID  . heparin  5,000 Units Subcutaneous Q8H  . insulin aspart  0-15 Units Subcutaneous TID WC  . latanoprost  1 drop Both Eyes QHS  . losartan  25 mg Oral BID  . metoprolol tartrate  50 mg Oral BID  . olopatadine  1 drop Both Eyes BID  . pantoprazole  40 mg Oral Daily  . potassium chloride  40 mEq Oral BID  . risperiDONE  0.25 mg Oral BH-q7a  . risperiDONE  2 mg Oral QHS  . senna  1 tablet Oral Daily  . tamsulosin  0.4 mg Oral Daily  . timolol  1 drop Both Eyes Daily   Continuous Infusions:  PRN Meds: acetaminophen, nitroGLYCERIN, ondansetron (ZOFRAN) IV   Vital Signs    Vitals:   07/06/18 1539 07/06/18 1736 07/06/18 2046 07/07/18 0457  BP: 91/80 105/84 100/86 (!) 120/100  Pulse:   91 90  Resp:  (!) 28    Temp:    97.7 F (36.5 C)  TempSrc:    Oral  SpO2:   93% 98%  Weight:    84.3 kg    Intake/Output Summary (Last 24 hours) at 07/07/2018 0913 Last data filed at 07/07/2018 0016 Gross per 24 hour  Intake 874.04 ml  Output -  Net 874.04 ml   Filed Weights   07/06/18 0309 07/07/18 0457  Weight: 84.9 kg 84.3 kg    Telemetry    A. fib in the 80s-90s- Personally Reviewed  ECG    No new tracings- Personally Reviewed  Physical Exam   GEN:  Frail, elderly male no acute distress.   Neck: No JVD Cardiac:  Irregularly irregular rhythm, no murmurs, rubs, or gallops.  Respiratory: Clear to auscultation bilaterally. GI: Soft, nontender, non-distended   MS:  Trace left pretibial edema;  Neuro:   Alert and oriented X 2.  Speech mildly difficult to understand. Psych: Normal affect   Labs    Chemistry Recent Labs  Lab 06/30/18 1129  07/04/18 1140 07/05/18 0351 07/05/18 1437 07/06/18 0957 07/07/18 0345  NA 141   < > 143 144  --  140 141  K 3.8   < > 3.7 3.4* 4.0 4.2 4.9  CL 106   < > 108 109  --  106 109  CO2 27   < > 25 30  --  23 24  GLUCOSE 131*   < > 136* 102*  --  190* 151*  BUN 12   < > 15 15  --  15 21  CREATININE 1.28*   < > 1.26* 1.36*  --  1.60* 1.76*  CALCIUM 8.7*   < > 8.7* 8.4*  --  8.8* 9.0  PROT 5.7*  --  5.5*  --   --   --   --   ALBUMIN 3.2*  --  3.2*  --   --   --   --  AST 15  --  17  --   --   --   --   ALT 26  --  28  --   --   --   --   ALKPHOS 63  --  63  --   --   --   --   BILITOT 0.9  --  0.8  --   --   --   --   GFRNONAA 55*   < > 56* 51*  --  42* 37*  GFRAA >60   < > >60 59*  --  49* 43*  ANIONGAP 8   < > 10 5  --  11 8   < > = values in this interval not displayed.     Hematology Recent Labs  Lab 06/30/18 1129 07/04/18 1140 07/05/18 0351  WBC 4.3 5.1 4.5  RBC 4.86 5.10 4.57  HGB 14.3 14.7 13.3  HCT 44.4 47.4 40.9  MCV 91.4 92.9 89.5  MCH 29.4 28.8 29.1  MCHC 32.2 31.0 32.5  RDW 15.3 15.5 15.4  PLT 170 145* 155    Cardiac EnzymesNo results for input(s): TROPONINI in the last 168 hours. No results for input(s): TROPIPOC in the last 168 hours.   BNP Recent Labs  Lab 07/04/18 1140  BNP 945.8*     DDimer No results for input(s): DDIMER in the last 168 hours.   Radiology    No results found.  Cardiac Studies   Echo 07/05/2018 Study Conclusions - Left ventricle: The cavity size was mildly dilated. Wall   thickness was normal. The estimated ejection fraction was 15%.   Diffuse hypokinesis. The study was not technically sufficient to   allow evaluation of LV diastolic dysfunction due to atrial   fibrillation. - Aortic valve: There was no stenosis. - Mitral valve: Mildly  calcified annulus. There was mild   regurgitation. - Left atrium: The atrium was mildly dilated. - Right ventricle: The cavity size was normal. Systolic function   was moderately reduced. - Right atrium: The atrium was mildly dilated. - Tricuspid valve: Peak RV-RA gradient (S): 25 mm Hg. - Pulmonary arteries: PA peak pressure: 40 mm Hg (S). - Systemic veins: IVC measured 2.9 cm with < 50% respirophasic   variation, suggesting RA pressure 15 mmHg.  Impressions: - The patient was in atrial fibrillation. Mildly dilated LV with EF   15%, diffuse hypokinesis. Normal RV size with moderately   decreased systolic function. Mild pulmonary hypertension. Dilated   IVC suggestive of elevated RV filling pressure.  Echo 08/08/2017: EF 45-50% with grade 2 diastolic dysfunction  Patient Profile     72 y.o. male w/ hx schizophrenia, mental retardation, DM, CVA, S-D-CHF, PAF, fall w/ SAH and C6 fx so no anticoag despite CHA2DS2-VASc = 6 (age x 1, CHF, HTN, DM, CVA x 2). Pt admitted 11/05 for rapid afib and CHF  Assessment & Plan    Persistent atrial fibrillation with rapid ventricular response -Cardizem was discontinued due to decreased EF -On metoprolol 50 mg twice daily with rates continued above 100, dose limited by blood pressure. Amiodarone IV initiated yesterday morning.  Now transitioned to amiodarone 400 mg p.o. twice daily. -Rates better controlled in the 80s-90s.  Blood pressure is somewhat labile, was soft yesterday but stable today, so far. -Will have staff get pt up and if HR stays below 100, possible discharge. Pt from assisted living.   Acute on chronic combined systolic and diastolic CHF (HCC) -EF down to 15% on  echo 07/05/2018 from 45-50% in 07/2017 -Possibly tachycardia mediated but cannot cardiovert the patient since he cannot be anticoagulated -Continued on beta-blocker and ARB, unable to titrate up due to blood pressure.  -Recent was gently diuresed with Lasix 40 IV, now  discontinued.  May need low-dose oral Lasix.  Could consider Spironolactone in the future, however blood pressure currently would not tolerate. -She would benefit from [facility], however currently renal function and blood pressure would most likely not allow.   AKI -Baseline SCr appears to be 1-1.3. SCr 1.26>1.60> 1.76 today. BUN up to 21 -Hold diuretics for today -We will likely need to resume oral dosing of Lasix tomorrow. -Follow up BMet in 1 week.   Hypokalemia -Improved with supplementation.  Will hold KDur with holding lasix today  Anticoagulation -High risk for falls and history of SAH -No anticoagulation as outpatient -Still feel that patient is not a candidate for anticoagulation   For questions or updates, please contact [facility] Please consult [email or web address] for contact info under        Signed, Berton Bon, NP  07/07/2018, 9:13 AM    I have examined the patient and reviewed assessment and plan and discussed with patient.  Agree with above as stated.  Much better rate control with amiodarone.  After a few months, this should help his LVEF to recover.  COntinue amiodarone 400 mg BID.  At f/u, this can likely be decreased to 400 mg daily.    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  May intermittently go to stage 3 with diuresis.  Responded well to IV Lasix,.  Restart Lasix 40 mg PO daily tomorrow.  BMet in one week with titration of diuretics based on volume status.   Not a candidate for anticoagulation due to prior ICH.  Agree with above in terms of renal insufficiency limiting therapy for LV dysfunction.  I think rate control will make a big difference in LVEF.   Lance Muss

## 2018-07-07 NOTE — Progress Notes (Signed)
Attempted to give a report to [facility].  Hinton Dyer, RN

## 2018-07-07 NOTE — Discharge Summary (Addendum)
Discharge Summary    Patient ID: Jordan Rowe MRN: [id number]; DOB: [birth year]  Admit date: 07/04/2018 Discharge date: 07/07/2018  Primary Care Provider: Patient, No Pcp Per  Primary Cardiologist: Nicki Guadalajara, MD  Primary Electrophysiologist:  None   Discharge Diagnoses    Principal Problem:   Persistent atrial fibrillation with rapid ventricular response Active Problems:   Acute on chronic combined systolic and diastolic CHF (congestive heart failure) (HCC)   A-fib (HCC)   Allergies No Known Allergies  Diagnostic Studies/Procedures    Echocardiogram 07/05/2018 Study Conclusions - Left ventricle: The cavity size was mildly dilated. Wall   thickness was normal. The estimated ejection fraction was 15%.   Diffuse hypokinesis. The study was not technically sufficient to   allow evaluation of LV diastolic dysfunction due to atrial   fibrillation. - Aortic valve: There was no stenosis. - Mitral valve: Mildly calcified annulus. There was mild   regurgitation. - Left atrium: The atrium was mildly dilated. - Right ventricle: The cavity size was normal. Systolic function   was moderately reduced. - Right atrium: The atrium was mildly dilated. - Tricuspid valve: Peak RV-RA gradient (S): 25 mm Hg. - Pulmonary arteries: PA peak pressure: 40 mm Hg (S). - Systemic veins: IVC measured 2.9 cm with < 50% respirophasic   variation, suggesting RA pressure 15 mmHg.  Impressions: - The patient was in atrial fibrillation. Mildly dilated LV with EF   15%, diffuse hypokinesis. Normal RV size with moderately   decreased systolic function. Mild pulmonary hypertension. Dilated   IVC suggestive of elevated RV filling pressure. _____________   History of Present Illness     Jordan Rowe is a 72 y.o. 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 Lives in assisted living.   He was initially evaluated in the ED in January 2017 with chest pain, he was noted to be in atrial fibrillation with RVR of unknown duration at that time. He was initially started on IV Cardizem, this was later switched to carvedilol. He was also placed on amiodarone and Eliquis due to elevated CHA2DS2-Vasc score.Echocardiogram at the time showed EF 20-25%. He was already on ACE inhibitor prior to admission. He was also started on spironolactone as well. Myoview showed no ischemia. 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   Unfortunately, patient suffered a fall in September and CT of the head showed a small subarachnoid hemorrhage with C6 mild fracture. C6 fracture was managed by soft collar, no surgery planned. Neurosurgery was consulted for subarachnoid hemorrhage, felt the patient is not a surgery candidate either. His Eliquis was discontinued. He has been on hospice. He returned to the hospital in December with weakness and was evaluated for bradycardia. His systolic blood pressure was hypertensive in the 170s. EKG showed sinus bradycardia with prolonged QTC. After admission, his amiodarone and carvedilol were held. His heart rate eventually improved. A repeat echocardiogram showed ejection fraction has improved to 45-50%, grade 2 DD, mild AI, PA peak pressure 20 mmHg.   He was seen in afib clinic 06/30/18 for routine follow up. Note in afib AVR at rate of 134 as well as volume overload. Started on metoprolol 25mg  BID and increased lasix to 40mg  qd. Seen in afib clinic today. Weight lost 5 lb. Noted labored breathing. Per note, nursing home resident noted labored breathing with minimal walking. Mostly wheel chair bound. Patient is not a  good historian. Sent to ER for further evaluation. Hx limited due to MR. Denies chest pain or sob.   Hospital Course     Consultants: none  Cardizem used briefly  for rate control but stopped due to LV systolic dysfunction.  Toprol increased from 25 to 50 mg twice daily.  Titration limited by low blood pressure.  After discussion with the pharmacist it was decided to initiate amiodarone for rate control.  Heart rate is much better controlled in the 80s-90s.  He will be discharged home on amiodarone 400 mg twice daily and can likely decrease to 400 mg daily at follow-up. Pt will need a new printed prescription for the assisted living facility.   The patient is not a candidate for anticoagulation due to high risk for falls and history of SAH.  EF down to 15% on echo 07/05/2018 from 45-50% in 07/2017, possibly tachycardia mediated but cannot cardiovert the patient since he cannot be anticoagulated. Continued on beta-blocker and ARB, unable to titrate up due to blood pressure. Likely unable to tolerate Entresto due to renal function and blood pressure. Rate control may make a significant difference in LVEF.  He was diuresed with IV Lasix.  Serum creatinine did bump up to 1.76 today.  Holding diuretic today and resume his home Lasix dosing tomorrow.  He will need a metabolic panel at 1 week.  Patient has been seen by Dr. Eldridge Dace today and deemed ready for discharge home. All follow up appointments have been scheduled. Discharge medications are listed below.  _____________  Discharge Vitals Blood pressure (!) 124/103, pulse 96, temperature 97.7 F (36.5 C), temperature source Oral, resp. rate 16, weight 84.3 kg, SpO2 (!) 89 %.  Filed Weights   07/06/18 0309 07/07/18 0457  Weight: 84.9 kg 84.3 kg    Labs & Radiologic Studies    CBC Recent Labs    07/05/18 0351  WBC 4.5  HGB 13.3  HCT 40.9  MCV 89.5  PLT 155   Basic Metabolic Panel Recent Labs    16/10/96 0957 07/07/18 0345  NA 140 141  K 4.2 4.9  CL 106 109  CO2 23 24  GLUCOSE 190* 151*  BUN 15 21  CREATININE 1.60* 1.76*  CALCIUM 8.8* 9.0   Liver Function Tests No results for input(s):  AST, ALT, ALKPHOS, BILITOT, PROT, ALBUMIN in the last 72 hours. No results for input(s): LIPASE, AMYLASE in the last 72 hours. Cardiac Enzymes No results for input(s): CKTOTAL, CKMB, CKMBINDEX, TROPONINI in the last 72 hours. BNP Invalid input(s): POCBNP D-Dimer No results for input(s): DDIMER in the last 72 hours. Hemoglobin A1C No results for input(s): HGBA1C in the last 72 hours. Fasting Lipid Panel No results for input(s): CHOL, HDL, LDLCALC, TRIG, CHOLHDL, LDLDIRECT in the last 72 hours. Thyroid Function Tests No results for input(s): TSH, T4TOTAL, T3FREE, THYROIDAB in the last 72 hours.  Invalid input(s): FREET3 _____________  Dg Chest 2 View  Result Date: 07/04/2018 CLINICAL DATA:  Short of breath, cough for a week EXAM: CHEST - 2 VIEW COMPARISON:  Chest x-ray of 07/04/2018 FINDINGS: On this erect view, there are bilateral pleural effusions present with bibasilar atelectasis. On the lateral view there is some fluid noted within the fissures, and there is mild cardiomegaly present. This may indicate mild congestive heart failure. No focal infiltrate is seen. There are healed rib fractures present on the right. IMPRESSION: Cardiomegaly and small effusions with a small amount of fluid in the fissures suggesting mild CHF. No definite  pneumonia. Electronically Signed   By: Dwyane Dee M.D.   On: 07/04/2018 11:22   Dg Chest 2 View  Result Date: 07/04/2018 CLINICAL DATA:  Shortness of breath and cough. EXAM: CHEST - 2 VIEW COMPARISON:  11/30/2017 and prior radiographs FINDINGS: Cardiomegaly and pulmonary vascular congestion noted. Bibasilar opacities are identified, question atelectasis versus airspace disease/pneumonia. Probable trace bilateral pleural effusions noted. No pneumothorax or acute bony abnormality. Remote rib fractures are again noted. IMPRESSION: Cardiomegaly with pulmonary vascular congestion and probable trace bilateral pleural effusions. Bibasilar opacities, question  atelectasis versus airspace disease/pneumonia. Electronically Signed   By: Harmon Pier M.D.   On: 07/04/2018 09:50   Disposition   Pt is being discharged home today in good condition.  Follow-up Plans & Appointments    Follow-up Information    [facility], [place] Follow up.   Specialties:  Cardiology, Radiology Why:  Cardiology hospital follow up on 07/17/18 at 9:00. Labs will also be done. Please arrive 15 minutes early for check in.  Contact information: [address] [place] [address] [phone]          Discharge Instructions    (HEART FAILURE PATIENTS) Call MD:  Anytime you have any of the following symptoms: 1) 3 pound weight gain in 24 hours or 5 pounds in 1 week 2) shortness of breath, with or without a dry hacking cough 3) swelling in the hands, feet or stomach 4) if you have to sleep on extra pillows at night in order to breathe.   Complete by:  As directed    Diet - low sodium heart healthy   Complete by:  As directed    Increase activity slowly   Complete by:  As directed       Discharge Medications   Allergies as of 07/07/2018   No Known Allergies     Medication List    TAKE these medications   acetaminophen 325 MG tablet Commonly known as:  TYLENOL Take 325-650 mg by mouth every 6 (six) hours as needed for mild pain or moderate pain.   amiodarone 400 MG tablet Commonly known as:  PACERONE Take 1 tablet (400 mg total) by mouth 2 (two) times daily.   atorvastatin 10 MG tablet Commonly known as:  LIPITOR Take 10 mg by mouth at bedtime.   Bepotastine Besilate 1.5 % Soln Place 1 drop into both eyes 2 (two) times daily.   brinzolamide 1 % ophthalmic suspension Commonly known as:  AZOPT Place 1 drop into both eyes 2 (two) times daily.   cholecalciferol 1000 units tablet Commonly known as:  VITAMIN D Take 1,000 Units by mouth every morning.   finasteride 5 MG tablet Commonly known as:  PROSCAR Take 5 mg by mouth every evening.     furosemide 20 MG tablet Commonly known as:  LASIX Take 2 tablets (40 mg total) by mouth daily.   insulin glargine 100 UNIT/ML injection Commonly known as:  LANTUS Inject 0.05 mLs (5 Units total) into the skin at bedtime. Hold is FSBS is less than 75   latanoprost 0.005 % ophthalmic solution Commonly known as:  XALATAN Place 1 drop into both eyes at bedtime.   losartan 25 MG tablet Commonly known as:  COZAAR Take 1 tablet (25 mg total) by mouth daily. What changed:  when to take this   metoprolol tartrate 50 MG tablet Commonly known as:  LOPRESSOR Take 1 tablet (50 mg total) by mouth 2 (two) times daily. What changed:  medication strength  how much to take   olopatadine 0.1 % ophthalmic solution Commonly known as:  PATANOL Place 1 drop into both eyes every morning.   omeprazole 20 MG capsule Commonly known as:  PRILOSEC Take 40 mg by mouth every morning.   REFRESH OP Place 1 drop into both eyes 2 (two) times daily.   RISPERDAL 0.25 MG tablet Generic drug:  risperiDONE Take 0.25 mg by mouth every morning.   risperiDONE 2 MG tablet Commonly known as:  RISPERDAL Take 2 mg by mouth at bedtime.   senna 8.6 MG Tabs tablet Commonly known as:  SENOKOT Take 1 tablet by mouth daily.   tamsulosin 0.4 MG Caps capsule Commonly known as:  FLOMAX Take 1 capsule (0.4 mg total) by mouth daily.   timolol 0.5 % ophthalmic solution Commonly known as:  BETIMOL Place 1 drop into both eyes daily.        Acute coronary syndrome (MI, NSTEMI, STEMI, etc) this admission?: No.    Outstanding Labs/Studies   Will need BMet at follow up.   Duration of Discharge Encounter   Greater than 30 minutes including physician time.  Signed, Berton Bon, NP 07/07/2018, 12:57 PM   I have examined the patient and reviewed assessment and plan and discussed with patient.  Agree with above as stated.  Much better rate control with amiodarone.  After a few months, this should help his  LVEF to recover.  COntinue amiodarone 400 mg BID.  At f/u, this can likely be decreased to 400 mg daily.    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  May intermittently go to stage 3 with diuresis.  Responded well to IV Lasix,.  Restart Lasix 40 mg PO daily tomorrow.  BMet in one week with titration of diuretics based on volume status.   Not a candidate for anticoagulation due to prior ICH.  Agree with above in terms of renal insufficiency limiting therapy for LV dysfunction.  I think rate control will make a big difference in LVEF.   Lance Muss

## 2018-07-07 NOTE — Progress Notes (Signed)
Pt is awaiting for PTAR.  Denies pain or discomfort.  Hinton Dyer, RN

## 2018-07-07 NOTE — NC FL2 (Signed)
[place] MEDICAID FL2 LEVEL OF CARE SCREENING TOOL     IDENTIFICATION  Patient Name: Jordan Rowe Birthdate: 12-21-1945 Sex: male Admission Date (Current Location): 07/04/2018  [facility] and [place] Number:  Producer, television/film/video and Address:  [facility], [address] [place], [place] [address]      Provider Number: [id number]  Attending Physician Name and Address:  Corky Crafts, MD  Relative Name and Phone Number:  [name], Celine Ahr [phone]); Peri Maris, sister [phone])    Current Level of Care: Hospital Recommended Level of Care: Assisted Living Facility Prior Approval Number:    Date Approved/Denied:   PASRR Number:    Discharge Plan: Domiciliary (Rest home)(ALF)    Current Diagnoses: Patient Active Problem List   Diagnosis Date Noted  . Acute on chronic combined systolic and diastolic CHF (congestive heart failure) (HCC) 07/05/2018  . A-fib (HCC) 07/05/2018  . Persistent atrial fibrillation with rapid ventricular response 07/04/2018  . Chronic systolic CHF (congestive heart failure) (HCC) 11/30/2017  . Right lower lobe pneumonia (HCC) 11/30/2017  . Bradycardia 08/07/2017  . Chronic diastolic CHF (congestive heart failure) (HCC) 05/09/2017  . BPH (benign prostatic hyperplasia) 05/09/2017  . Subarachnoid bleed (HCC) 05/09/2017  . Traumatic closed fracture of C6 vertebra with minimal displacement, initial encounter (HCC) 05/08/2017  . Chronic anticoagulation 05/08/2017  . Hematoma of oral cavity 05/08/2017  . Fall   . Stroke-like symptom 01/13/2017  . NSVT (nonsustained ventricular tachycardia) (HCC) 10/05/2015  . Cardiomyopathy (HCC)   . Smoker 10/01/2015  . Schizophrenia (HCC)   . Diabetes mellitus type 2, controlled (HCC) 09/30/2015  . Atrial fibrillation (HCC) 09/30/2015  . Pain in the chest   . 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 Diabetes mellitus (HCC) 02/02/2013  . Dehydration 02/02/2013  . MENTAL RETARDATION  02/12/2009  . Essential hypertension 02/12/2009  . GERD 02/12/2009  . CONSTIPATION 02/12/2009    Orientation RESPIRATION BLADDER Height & Weight     Self, Situation, Place, Time  Normal Incontinent Weight: 84.3 kg Height:     BEHAVIORAL SYMPTOMS/MOOD NEUROLOGICAL BOWEL NUTRITION STATUS      Continent Diet(regular)  AMBULATORY STATUS COMMUNICATION OF NEEDS Skin   (baseline) Verbally Normal                       Personal Care Assistance Level of Assistance  (baseline) Bathing Assistance: (baseline) Feeding assistance: Independent Dressing Assistance: (baseline)     Functional Limitations Info  Sight, Hearing, Speech Sight Info: Adequate Hearing Info: Adequate Speech Info: Adequate    SPECIAL CARE FACTORS FREQUENCY                       Contractures Contractures Info: Not present    Additional Factors Info  Code Status, Allergies, Psychotropic, Insulin Sliding Scale Code Status Info: Full Code Allergies Info: No Known Allergies Psychotropic Info: risperdal Insulin Sliding Scale Info: novolog 3x/day with meals       Current Medications (07/07/2018):  This is the current hospital active medication list Current Facility-Administered Medications  Medication Dose Route Frequency Provider Last Rate Last Dose  . acetaminophen (TYLENOL) tablet 650 mg  650 mg Oral Q4H PRN Bhagat, Bhavinkumar, PA      . amiodarone (PACERONE) tablet 400 mg  400 mg Oral BID Lance Muss S, MD   400 mg at 07/07/18 1020  . atorvastatin (LIPITOR) tablet 10 mg  10 mg Oral QHS Bhagat, Bhavinkumar, PA   10 mg  at 07/06/18 2207  . brinzolamide (AZOPT) 1 % ophthalmic suspension 1 drop  1 drop Both Eyes BID Bhagat, Bhavinkumar, PA   1 drop at 07/07/18 1028  . cholecalciferol (VITAMIN D) tablet 1,000 Units  1,000 Units Oral q morning - 10a Bhagat, Bhavinkumar, PA   1,000 Units at 07/07/18 1020  . finasteride (PROSCAR) tablet 5 mg  5 mg Oral QPM Bhagat, Bhavinkumar, PA   5 mg at 07/06/18  1739  . heparin injection 5,000 Units  5,000 Units Subcutaneous Q8H Bhagat, Bhavinkumar, PA   5,000 Units at 07/07/18 0657  . insulin aspart (novoLOG) injection 0-15 Units  0-15 Units Subcutaneous TID WC Bhagat, Bhavinkumar, PA   3 Units at 07/06/18 1712  . latanoprost (XALATAN) 0.005 % ophthalmic solution 1 drop  1 drop Both Eyes QHS Bhagat, Bhavinkumar, PA   1 drop at 07/06/18 2203  . losartan (COZAAR) tablet 25 mg  25 mg Oral BID Bhagat, Bhavinkumar, PA   25 mg at 07/07/18 1020  . metoprolol tartrate (LOPRESSOR) tablet 50 mg  50 mg Oral BID Bhagat, Bhavinkumar, PA   50 mg at 07/07/18 1020  . nitroGLYCERIN (NITROSTAT) SL tablet 0.4 mg  0.4 mg Sublingual Q5 Min x 3 PRN Bhagat, Bhavinkumar, PA      . olopatadine (PATANOL) 0.1 % ophthalmic solution 1 drop  1 drop Both Eyes BID Bhagat, Bhavinkumar, PA   1 drop at 07/07/18 1021  . ondansetron (ZOFRAN) injection 4 mg  4 mg Intravenous Q6H PRN Bhagat, Bhavinkumar, PA      . pantoprazole (PROTONIX) EC tablet 40 mg  40 mg Oral Daily Bhagat, Bhavinkumar, PA   40 mg at 07/07/18 1020  . risperiDONE (RISPERDAL) tablet 0.25 mg  0.25 mg Oral BH-q7a Bhagat, Bhavinkumar, PA   0.25 mg at 07/07/18 0656  . risperiDONE (RISPERDAL) tablet 2 mg  2 mg Oral QHS Bhagat, Bhavinkumar, PA   2 mg at 07/06/18 2207  . senna (SENOKOT) tablet 8.6 mg  1 tablet Oral Daily Bhagat, Bhavinkumar, PA   8.6 mg at 07/07/18 1020  . tamsulosin (FLOMAX) capsule 0.4 mg  0.4 mg Oral Daily Bhagat, Bhavinkumar, PA   0.4 mg at 07/07/18 1020  . timolol (TIMOPTIC) 0.5 % ophthalmic solution 1 drop  1 drop Both Eyes Daily Bhagat, Bhavinkumar, PA   1 drop at 07/07/18 1026     Discharge Medications: Please see discharge summary for a list of discharge medications.  Relevant Imaging Results:  Relevant Lab Results:   Additional Information SSN: [id number]  Abigail Butts, LCSW

## 2018-07-13 LAB — CULTURE, BLOOD (ROUTINE X 2)
Culture: NO GROWTH
SPECIAL REQUESTS: ADEQUATE

## 2018-07-17 ENCOUNTER — Ambulatory Visit: Payer: Medicare Other | Admitting: Physician Assistant

## 2018-07-20 ENCOUNTER — Encounter: Payer: Self-pay | Admitting: Physician Assistant

## 2018-11-24 ENCOUNTER — Emergency Department (HOSPITAL_COMMUNITY): Payer: Medicare Other

## 2018-11-24 ENCOUNTER — Other Ambulatory Visit: Payer: Self-pay

## 2018-11-24 ENCOUNTER — Emergency Department (HOSPITAL_COMMUNITY)
Admission: EM | Admit: 2018-11-24 | Discharge: 2018-11-24 | Disposition: A | Discharge status: Home / Self Care | Payer: Medicare Other | Attending: Emergency Medicine | Admitting: Emergency Medicine

## 2018-11-24 DIAGNOSIS — Y998 Other external cause status: Secondary | ICD-10-CM | POA: Diagnosis not present

## 2018-11-24 DIAGNOSIS — M542 Cervicalgia: Secondary | ICD-10-CM | POA: Insufficient documentation

## 2018-11-24 DIAGNOSIS — I110 Hypertensive heart disease with heart failure: Secondary | ICD-10-CM | POA: Insufficient documentation

## 2018-11-24 DIAGNOSIS — I5022 Chronic systolic (congestive) heart failure: Secondary | ICD-10-CM | POA: Diagnosis not present

## 2018-11-24 DIAGNOSIS — I4819 Other persistent atrial fibrillation: Secondary | ICD-10-CM | POA: Insufficient documentation

## 2018-11-24 DIAGNOSIS — Z794 Long term (current) use of insulin: Secondary | ICD-10-CM | POA: Insufficient documentation

## 2018-11-24 DIAGNOSIS — F17200 Nicotine dependence, unspecified, uncomplicated: Secondary | ICD-10-CM | POA: Diagnosis not present

## 2018-11-24 DIAGNOSIS — E119 Type 2 diabetes mellitus without complications: Secondary | ICD-10-CM | POA: Insufficient documentation

## 2018-11-24 DIAGNOSIS — Z8673 Personal history of transient ischemic attack (TIA), and cerebral infarction without residual deficits: Secondary | ICD-10-CM | POA: Diagnosis not present

## 2018-11-24 DIAGNOSIS — R51 Headache: Secondary | ICD-10-CM | POA: Insufficient documentation

## 2018-11-24 DIAGNOSIS — Y939 Activity, unspecified: Secondary | ICD-10-CM | POA: Insufficient documentation

## 2018-11-24 DIAGNOSIS — Y92129 Unspecified place in nursing home as the place of occurrence of the external cause: Secondary | ICD-10-CM | POA: Insufficient documentation

## 2018-11-24 DIAGNOSIS — W19XXXA Unspecified fall, initial encounter: Secondary | ICD-10-CM | POA: Diagnosis not present

## 2018-11-24 DIAGNOSIS — Z79899 Other long term (current) drug therapy: Secondary | ICD-10-CM | POA: Diagnosis not present

## 2018-11-24 LAB — CBC WITH DIFFERENTIAL/PLATELET
Abs Immature Granulocytes: 0 10*3/uL (ref 0.00–0.07)
Basophils Absolute: 0 10*3/uL (ref 0.0–0.1)
Basophils Relative: 0 %
Eosinophils Absolute: 0 10*3/uL (ref 0.0–0.5)
Eosinophils Relative: 0 %
HEMATOCRIT: 43.7 % (ref 39.0–52.0)
Hemoglobin: 14 g/dL (ref 13.0–17.0)
Lymphocytes Relative: 14 %
Lymphs Abs: 0.9 10*3/uL (ref 0.7–4.0)
MCH: 29.4 pg (ref 26.0–34.0)
MCHC: 32 g/dL (ref 30.0–36.0)
MCV: 91.6 fL (ref 80.0–100.0)
Monocytes Absolute: 0.1 10*3/uL (ref 0.1–1.0)
Monocytes Relative: 2 %
Neutro Abs: 5.6 10*3/uL (ref 1.7–7.7)
Neutrophils Relative %: 84 %
Platelets: 226 10*3/uL (ref 150–400)
RBC: 4.77 MIL/uL (ref 4.22–5.81)
RDW: 15.9 % — ABNORMAL HIGH (ref 11.5–15.5)
Smear Review: ADEQUATE
WBC: 6.7 10*3/uL (ref 4.0–10.5)
nRBC: 0 % (ref 0.0–0.2)
nRBC: 0 /100 WBC

## 2018-11-24 LAB — URINALYSIS, ROUTINE W REFLEX MICROSCOPIC
Bilirubin Urine: NEGATIVE
Glucose, UA: NEGATIVE mg/dL
HGB URINE DIPSTICK: NEGATIVE
Ketones, ur: NEGATIVE mg/dL
Leukocytes,Ua: NEGATIVE
Nitrite: NEGATIVE
Protein, ur: NEGATIVE mg/dL
Specific Gravity, Urine: 1.018 (ref 1.005–1.030)
pH: 5 (ref 5.0–8.0)

## 2018-11-24 LAB — TSH: TSH: 0.501 u[IU]/mL (ref 0.350–4.500)

## 2018-11-24 LAB — COMPREHENSIVE METABOLIC PANEL
ALBUMIN: 2.8 g/dL — AB (ref 3.5–5.0)
ALT: 263 U/L — ABNORMAL HIGH (ref 0–44)
AST: 122 U/L — ABNORMAL HIGH (ref 15–41)
Alkaline Phosphatase: 76 U/L (ref 38–126)
Anion gap: 11 (ref 5–15)
BILIRUBIN TOTAL: 1.2 mg/dL (ref 0.3–1.2)
BUN: 19 mg/dL (ref 8–23)
CO2: 25 mmol/L (ref 22–32)
Calcium: 8.7 mg/dL — ABNORMAL LOW (ref 8.9–10.3)
Chloride: 104 mmol/L (ref 98–111)
Creatinine, Ser: 1.7 mg/dL — ABNORMAL HIGH (ref 0.61–1.24)
GFR calc Af Amer: 46 mL/min — ABNORMAL LOW (ref >60)
GFR calc non Af Amer: 40 mL/min — ABNORMAL LOW (ref >60)
Glucose, Bld: 171 mg/dL — ABNORMAL HIGH (ref 70–99)
Potassium: 3.6 mmol/L (ref 3.5–5.1)
Sodium: 140 mmol/L (ref 135–145)
Total Protein: 5.7 g/dL — ABNORMAL LOW (ref 6.5–8.1)

## 2018-11-24 LAB — AMMONIA: Ammonia: 27 umol/L (ref 9–35)

## 2018-11-24 LAB — BRAIN NATRIURETIC PEPTIDE: B Natriuretic Peptide: 494 pg/mL — ABNORMAL HIGH (ref 0.0–100.0)

## 2018-11-24 LAB — PROTIME-INR
INR: 1.3 — ABNORMAL HIGH (ref 0.8–1.2)
Prothrombin Time: 15.8 seconds — ABNORMAL HIGH (ref 11.4–15.2)

## 2018-11-24 NOTE — Discharge Instructions (Signed)
Your imaging today showed no evidence of fracture dislocation.  There is no evidence of intracranial injury in your head.  Your neck did not show any new fractures.  Your chest x-ray showed continued fluid overload and some edema.  Please continue your home medications.  Your liver function also was elevated, please follow-up with your primary doctor for recheck of this.  Given your reassuring vital signs and well appearance, we feel you are safe for discharge home with plans to follow-up with PCP as well as neurology given your report that you have had this leaning problem for several months.  If any symptoms change or worsen, return to the nearest emergency department.

## 2018-11-24 NOTE — ED Provider Notes (Signed)
[facility] EMERGENCY DEPARTMENT Provider Note   CSN: [id number] Arrival date & time: 11/24/18  1815    History   Chief Complaint Chief Complaint  Patient presents with   Fall    HPI Nuno Vangenderen is a 73 y.o. male.     The history is provided by medical records and the patient. No language interpreter was used.  Fall  This is a new problem. The current episode started 1 to 2 hours ago. The problem occurs rarely. The problem has not changed since onset.Associated symptoms include headaches. Pertinent negatives include no chest pain, no abdominal pain and no shortness of breath. Nothing aggravates the symptoms. Nothing relieves the symptoms. He has tried nothing for the symptoms. The treatment provided no relief.    Past Medical History:  Diagnosis Date   Atrial fibrillation with RVR (HCC)    [name] 09/30/2015   BPH (benign prostatic hypertrophy)    [name] 09/30/2015   Constipation    Edema    Fracture of C6 vertebra, closed (HCC) 04/2017   GERD (gastroesophageal reflux disease)    Glaucoma    Hypertension    ICB (intracranial bleed) (HCC) 04/2017   with fall   Mental retardation    /notes 09/30/2015   Persistent atrial fibrillation with rapid ventricular response 07/04/2018   Schizophrenia (HCC)    Stroke (HCC)    Type II diabetes mellitus (HCC)    [name] 09/30/2015   Vitamin D deficiency     Patient Active Problem List   Diagnosis Date Noted   Acute on chronic combined systolic and diastolic CHF (congestive heart failure) (HCC) 07/05/2018   A-fib (HCC) 07/05/2018   Persistent atrial fibrillation with rapid ventricular response 07/04/2018   Chronic systolic CHF (congestive heart failure) (HCC) 11/30/2017   Right lower lobe pneumonia (HCC) 11/30/2017   Bradycardia 08/07/2017   Chronic diastolic CHF (congestive heart failure) (HCC) 05/09/2017   BPH (benign prostatic hyperplasia) 05/09/2017   Subarachnoid bleed (HCC)  05/09/2017   Traumatic closed fracture of C6 vertebra with minimal displacement, initial encounter (HCC) 05/08/2017   Chronic anticoagulation 05/08/2017   Hematoma of oral cavity 05/08/2017   Fall    Stroke-like symptom 01/13/2017   NSVT (nonsustained ventricular tachycardia) (HCC) 10/05/2015   Cardiomyopathy (HCC)    Smoker 10/01/2015   Schizophrenia (HCC)    Diabetes mellitus type 2, controlled (HCC) 09/30/2015   Atrial fibrillation (HCC) 09/30/2015   Pain in the chest    Renal mass 02/05/2013   Diabetes mellitus (HCC) 02/02/2013   Dehydration 02/02/2013   MENTAL RETARDATION 02/12/2009   Essential hypertension 02/12/2009   GERD 02/12/2009   CONSTIPATION 02/12/2009    Past Surgical History:  Procedure Laterality Date   CATARACT EXTRACTION Left 12/2002   /notes 01/12/2011   CATARACT EXTRACTION W/ INTRAOCULAR LENS IMPLANT Right 01/2011   [name] 02/18/2011   MULTIPLE TOOTH EXTRACTIONS          Home Medications    Prior to Admission medications   Medication Sig Start Date End Date Taking? Authorizing Provider  acetaminophen (TYLENOL) 325 MG tablet Take 325-650 mg by mouth every 6 (six) hours as needed for mild pain or moderate pain.     [provider]  amiodarone (PACERONE) 400 MG tablet Take 1 tablet (400 mg total) by mouth 2 (two) times daily. 07/07/18   Berton Bon, NP  atorvastatin (LIPITOR) 10 MG tablet Take 10 mg by mouth at bedtime.     [provider]  Bepotastine Besilate 1.5 %  SOLN Place 1 drop into both eyes 2 (two) times daily.    [provider]  brinzolamide (AZOPT) 1 % ophthalmic suspension Place 1 drop into both eyes 2 (two) times daily.    [provider]  cholecalciferol (VITAMIN D) 1000 UNITS tablet Take 1,000 Units by mouth every morning.     [provider]  finasteride (PROSCAR) 5 MG tablet Take 5 mg by mouth every evening.     [provider]  furosemide (LASIX) 20 MG tablet  Take 2 tablets (40 mg total) by mouth daily. 06/30/18   Newman Nip, NP  insulin glargine (LANTUS) 100 UNIT/ML injection Inject 0.05 mLs (5 Units total) into the skin at bedtime. Hold is FSBS is less than 75 10/07/15   Christiane Ha, MD  latanoprost (XALATAN) 0.005 % ophthalmic solution Place 1 drop into both eyes at bedtime.    [provider]  losartan (COZAAR) 25 MG tablet Take 1 tablet (25 mg total) by mouth daily. Patient taking differently: Take 25 mg by mouth 2 (two) times daily.  08/11/17   Arlyce Harman, DO  metoprolol tartrate (LOPRESSOR) 50 MG tablet Take 1 tablet (50 mg total) by mouth 2 (two) times daily. 07/07/18   Berton Bon, NP  olopatadine (PATANOL) 0.1 % ophthalmic solution Place 1 drop into both eyes every morning.    [provider]  omeprazole (PRILOSEC) 20 MG capsule Take 40 mg by mouth every morning.     [provider]  Polyvinyl Alcohol-Povidone (REFRESH OP) Place 1 drop into both eyes 2 (two) times daily.    [provider]  risperiDONE (RISPERDAL) 0.25 MG tablet Take 0.25 mg by mouth every morning.     [provider]  risperiDONE (RISPERDAL) 2 MG tablet Take 2 mg by mouth at bedtime.    [provider]  senna (SENOKOT) 8.6 MG TABS Take 1 tablet by mouth daily.     [provider]  tamsulosin (FLOMAX) 0.4 MG CAPS capsule Take 1 capsule (0.4 mg total) by mouth daily. 10/22/16   Trixie Dredge, PA-C  timolol (BETIMOL) 0.5 % ophthalmic solution Place 1 drop into both eyes daily.    [provider]    Family History Family History  Problem Relation Age of Onset   Hypertension Mother     Social History Social History   Tobacco Use   Smoking status: Current Every Day Smoker   Smokeless tobacco: Never Used  Substance Use Topics   Alcohol use: No   Drug use: No     Allergies   Patient has no known allergies.   Review of Systems Review of Systems  Constitutional: Positive  for fatigue. Negative for chills, diaphoresis and fever.  HENT: Negative for congestion.   Eyes: Negative for photophobia and visual disturbance.  Respiratory: Negative for cough, chest tightness, shortness of breath, wheezing and stridor.   Cardiovascular: Negative for chest pain, palpitations and leg swelling.  Gastrointestinal: Negative for abdominal pain, constipation, diarrhea, nausea and vomiting.  Genitourinary: Negative for flank pain and frequency.  Musculoskeletal: Positive for neck pain. Negative for back pain and neck stiffness.  Skin: Negative for rash and wound.  Neurological: Positive for headaches. Negative for dizziness, facial asymmetry, speech difficulty, weakness, light-headedness and numbness.  Psychiatric/Behavioral: Negative for agitation.  All other systems reviewed and are negative.    Physical Exam Updated Vital Signs BP 134/72 (BP Location: Left Arm)    Pulse (!) 50    Temp (!) 97.4 F (  36.3 C) (Oral)    Resp 17    Ht 5\' 9"  (1.753 m)    Wt 81.6 kg    SpO2 97%    BMI 26.58 kg/m   Physical Exam Vitals signs and nursing note reviewed.  Constitutional:      General: He is not in acute distress.    Appearance: He is well-developed. He is not ill-appearing, toxic-appearing or diaphoretic.  HENT:     Head: Normocephalic and atraumatic.     Nose: No congestion or rhinorrhea.     Mouth/Throat:     Mouth: Mucous membranes are moist.     Pharynx: No oropharyngeal exudate or posterior oropharyngeal erythema.  Eyes:     Extraocular Movements: Extraocular movements intact.     Conjunctiva/sclera: Conjunctivae normal.     Pupils: Pupils are equal, round, and reactive to light.  Neck:     Musculoskeletal: Neck supple. Muscular tenderness present.  Cardiovascular:     Rate and Rhythm: Normal rate and regular rhythm.     Pulses: Normal pulses.     Heart sounds: No murmur.  Pulmonary:     Effort: Pulmonary effort is normal. No respiratory distress.     Breath  sounds: Rhonchi present. No wheezing or rales.  Chest:     Chest wall: No tenderness.  Abdominal:     Palpations: Abdomen is soft.     Tenderness: There is no abdominal tenderness. There is no right CVA tenderness, left CVA tenderness or guarding.  Musculoskeletal:        General: Tenderness present.  Skin:    General: Skin is warm and dry.     Capillary Refill: Capillary refill takes less than 2 seconds.  Neurological:     General: No focal deficit present.     Mental Status: He is alert.     GCS: GCS eye subscore is 4. GCS verbal subscore is 5. GCS motor subscore is 6.     Sensory: No sensory deficit.     Motor: No weakness, tremor or abnormal muscle tone.     Coordination: Finger-Nose-Finger Test normal.     Comments: Gait deferred due to fall and feeling unsteady to left on arrival   Psychiatric:        Mood and Affect: Mood normal.      ED Treatments / Results  Labs (all labs ordered are listed, but only abnormal results are displayed) Labs Reviewed  CBC WITH DIFFERENTIAL/PLATELET - Abnormal; Notable for the following components:      Result Value   RDW 15.9 (*)    All other components within normal limits  COMPREHENSIVE METABOLIC PANEL - Abnormal; Notable for the following components:   Glucose, Bld 171 (*)    Creatinine, Ser 1.70 (*)    Calcium 8.7 (*)    Total Protein 5.7 (*)    Albumin 2.8 (*)    AST 122 (*)    ALT 263 (*)    GFR calc non Af Amer 40 (*)    GFR calc Af Amer 46 (*)    All other components within normal limits  PROTIME-INR - Abnormal; Notable for the following components:   Prothrombin Time 15.8 (*)    INR 1.3 (*)    All other components within normal limits  URINALYSIS, ROUTINE W REFLEX MICROSCOPIC - Abnormal; Notable for the following components:   Color, Urine AMBER (*)    APPearance HAZY (*)    All other components within normal limits  BRAIN NATRIURETIC PEPTIDE -  Abnormal; Notable for the following components:   B Natriuretic Peptide  494.0 (*)    All other components within normal limits  URINE CULTURE  AMMONIA  TSH    EKG EKG Interpretation  Date/Time:  Friday November 24 2018 18:39:32 EDT Ventricular Rate:  43 PR Interval:    QRS Duration: 154 QT Interval:  659 QTC Calculation: 558 R Axis:   -83 Text Interpretation:  Sinus or ectopic atrial bradycardia RBBB and LAFB Inferior infarct, old When compared to prior, now sinus bradycardia.  No STEMI Confirmed by Theda Belfast ([id number]) on 11/24/2018 6:45:39 PM   Radiology Dg Chest 2 View  Result Date: 11/24/2018 CLINICAL DATA:  Fall EXAM: CHEST - 2 VIEW COMPARISON:  07/04/2018 FINDINGS: Cardiomegaly. Mild, diffuse interstitial pulmonary opacity, which may be acute and/or chronic. Nonacute fracture deformities of the right ribs. IMPRESSION: Cardiomegaly. Mild, diffuse interstitial pulmonary opacity, which may reflect a component of acute pulmonary edema and chronic interstitial change. Electronically Signed   By: Lauralyn Primes M.D.   On: 11/24/2018 20:33   Ct Head Wo Contrast  Result Date: 11/24/2018 CLINICAL DATA:  Trauma to the head and neck. Unwitnessed fall. Pain. EXAM: CT HEAD WITHOUT CONTRAST CT CERVICAL SPINE WITHOUT CONTRAST TECHNIQUE: Multidetector CT imaging of the head and cervical spine was performed following the standard protocol without intravenous contrast. Multiplanar CT image reconstructions of the cervical spine were also generated. COMPARISON:  08/07/2017 FINDINGS: CT HEAD FINDINGS Brain: No acute finding by CT. Chronic small-vessel ischemic changes affect the pons. No focal cerebellar insult. Cerebral hemispheres show an old infarction in the right thalamus and an old infarction in the right frontal lobe. There is confluent chronic small vessel ischemic change affecting the cerebral hemispheric white matter. No mass lesion, hemorrhage, hydrocephalus or extra-axial collection. Vascular: There is atherosclerotic calcification of the major vessels at the base of  the brain. Skull: Negative Sinuses/Orbits: Clear/normal Other: None CT CERVICAL SPINE FINDINGS Alignment: No malalignment. Skull base and vertebrae: No evidence of fracture. Soft tissues and spinal canal: Negative Disc levels: Chronic degenerative spondylosis C5-6 and C6-7 with osteophytic encroachment upon the neural foramina right more than left. No severe stenosis. Upper chest: Negative Other: None IMPRESSION: Head CT: No acute or traumatic finding. Extensive ischemic changes throughout the brain as outlined above. Cervical spine CT: No acute or traumatic finding. Chronic spondylosis C5-6 and C6-7. Electronically Signed   By: Paulina Fusi M.D.   On: 11/24/2018 20:00   Ct Cervical Spine Wo Contrast  Result Date: 11/24/2018 CLINICAL DATA:  Trauma to the head and neck. Unwitnessed fall. Pain. EXAM: CT HEAD WITHOUT CONTRAST CT CERVICAL SPINE WITHOUT CONTRAST TECHNIQUE: Multidetector CT imaging of the head and cervical spine was performed following the standard protocol without intravenous contrast. Multiplanar CT image reconstructions of the cervical spine were also generated. COMPARISON:  08/07/2017 FINDINGS: CT HEAD FINDINGS Brain: No acute finding by CT. Chronic small-vessel ischemic changes affect the pons. No focal cerebellar insult. Cerebral hemispheres show an old infarction in the right thalamus and an old infarction in the right frontal lobe. There is confluent chronic small vessel ischemic change affecting the cerebral hemispheric white matter. No mass lesion, hemorrhage, hydrocephalus or extra-axial collection. Vascular: There is atherosclerotic calcification of the major vessels at the base of the brain. Skull: Negative Sinuses/Orbits: Clear/normal Other: None CT CERVICAL SPINE FINDINGS Alignment: No malalignment. Skull base and vertebrae: No evidence of fracture. Soft tissues and spinal canal: Negative Disc levels: Chronic degenerative spondylosis C5-6 and C6-7 with osteophytic  encroachment upon the  neural foramina right more than left. No severe stenosis. Upper chest: Negative Other: None IMPRESSION: Head CT: No acute or traumatic finding. Extensive ischemic changes throughout the brain as outlined above. Cervical spine CT: No acute or traumatic finding. Chronic spondylosis C5-6 and C6-7. Electronically Signed   By: Paulina Fusi M.D.   On: 11/24/2018 20:00   Dg Hip Unilat W Or Wo Pelvis 2-3 Views Left  Result Date: 11/24/2018 CLINICAL DATA:  Fall from chair today. EXAM: DG HIP (WITH OR WITHOUT PELVIS) 2-3V LEFT COMPARISON:  Radiographs 08/07/2017 FINDINGS: The cortical margins of the bony pelvis are intact. No fracture. Moderate bilateral hip osteoarthritis. Multifocal enthesopathy throughout the pelvis. Pubic symphysis and sacroiliac joints are congruent. Both femoral heads are well-seated in the respective acetabula. Stool ball distends the rectum. There are vascular calcifications. IMPRESSION: No fracture or subluxation of the pelvis or left hip. Electronically Signed   By: Narda Rutherford M.D.   On: 11/24/2018 20:35    Procedures Procedures (including critical care time)  Medications Ordered in ED Medications - No data to display   Initial Impression / Assessment and Plan / ED Course  I have reviewed the triage vital signs and the nursing notes.  Pertinent labs & imaging results that were available during my care of the patient were reviewed by me and considered in my medical decision making (see chart for details).        Jordan Rowe is a 73 y.o. male with a past medical history significant for mental retardation, hypertension, diabetes, atrial fibrillation does not appear to be on anticoagulation, cervical spine fracture, stroke, congestive heart failure, schizophrenia, prior intracranial bleed who presents with a report of 2 weeks of "leaning to the left", unwitnessed fall with resulting left hip pain, left neck pain, and headache.  Patient reports that he has been "leaning to  the left" for several weeks now.  He says this is new.  He denies any double vision, blurry vision, or speech difficulty.  He reports that he always has some generalized weakness and fatigue.  He does not think his left side is weaker in the arm or leg compared to the right.  He denies any numbness, tingling, or other symptoms.  He denies fevers, chills, congestion, cough, chest pain, palpitations or shortness of breath.  He denies any infectious symptoms.  Patient reports that he fell out of his chair striking his left side on the ground.  He denies loss of consciousness.  On exam, patient had tenderness in his neck behind his cervical immobilization collar.  Primarily in the left paraspinal neck.  Patient had no unilateral weakness or numbness on exam.  Symmetric smile.  Pupils are symmetric and reactive.  No facial droop.  Lungs had coarse breath sounds bilaterally.  No murmur.  Abdomen and chest nontender.  Left hip was tender to palpation and with manipulation.  He did not appear shortened.  Normal sensation in the legs.  Normal pulses in the legs.  Patient was able to weakly lift both legs symmetrically.  Normal finger-nose-finger testing bilaterally.  Clinically patient will have work-up for traumatic injury from this unwitnessed fall.  He will have head CT and neck CT.  He will also chest x-ray given the coarse breath sounds in all lung fields.  He will have left hip x-ray and will have screen laboratory testing to look for abnormalities that may contribute to this worsening fatigue and his fall.  As is "leaning left" symptoms  have gone on for several weeks, if he had a stroke anticipate seeing it on head CT.    On reassessment, patient clarified that the "leaning left" has been for many months.  He does not think this is a new problem.  Patient's work-up showed no evidence of intracranial bleed or stroke.  There was no evidence of traumatic injury to the neck.  Left hip x-ray showed no fracture  dislocation.  Chest x-ray shows no pneumonia and shows chronic scarring and some fluid overload.  Patient's labs showed elevation in LFTs which patient will follow-up with PCP for.  Ammonia not elevated.  BNP improved from prior but consistent with the patient's chest x-ray.  No leukocytosis or anemia.  Urinalysis showed no infection.  Has patient reports he is feeling much better and we did not see notes of severe traumatic injury, feel he is a for discharge home.  Patient will follow-up with PCP for his liver function and patient's family was called who agreed with this plan.  Patient understood instructions to follow-up with outpatient neurology for his gait problem and reports he will use a walker or stay nonambulatory until then.  Patient other questions or concerns and will be discharged back to his facility.   Final Clinical Impressions(s) / ED Diagnoses   Final diagnoses:  Fall, initial encounter    ED Discharge Orders    None      Clinical Impression: 1. Fall, initial encounter     Disposition: Discharge  Condition: Good  I have discussed the results, Dx and Tx plan with the pt(& family if present). He/she/they expressed understanding and agree(s) with the plan. Discharge instructions discussed at great length. Strict return precautions discussed and pt &/or family have verbalized understanding of the instructions. No further questions at time of discharge.    New Prescriptions   No medications on file    Follow Up: [facility] [address] [place] [address] [phone] Schedule an appointment as soon as possible for a visit    [facility] EMERGENCY DEPARTMENT [address] [id number] mc [place] [address] [phone]       Skii Cleland, Canary Brim, MD 11/24/18 2312

## 2018-11-24 NOTE — ED Triage Notes (Addendum)
Pt from [facility] after having unwitnessed fall, found by staff laying on his left side. Pt has been leaning to the left recently per staff. Pt states that he had a mechanical fall getting from his wheelchair to his bed. Axox4. Pt appears to have generalized weakness. No injuries noted but pt has left hip pain that has been going on "a while" Painful with palpation. Pt has some neck tenderness and is in c-collar.

## 2018-11-24 NOTE — ED Notes (Signed)
Patient transported to CT 

## 2018-11-25 LAB — URINE CULTURE

## 2018-12-29 DEATH — deceased
